# Patient Record
Sex: Female | Born: 1950 | Race: White | Hispanic: No | Smoking: Former smoker
Health system: Southern US, Community
[De-identification: ages and names within clinical notes are randomized; demographics above are authoritative.]

## PROBLEM LIST (undated history)

## (undated) DIAGNOSIS — R7303 Prediabetes: Secondary | ICD-10-CM

## (undated) DIAGNOSIS — I10 Essential (primary) hypertension: Secondary | ICD-10-CM

## (undated) DIAGNOSIS — F32A Depression, unspecified: Secondary | ICD-10-CM

## (undated) DIAGNOSIS — E079 Disorder of thyroid, unspecified: Secondary | ICD-10-CM

## (undated) DIAGNOSIS — R430 Anosmia: Secondary | ICD-10-CM

## (undated) DIAGNOSIS — I2510 Atherosclerotic heart disease of native coronary artery without angina pectoris: Secondary | ICD-10-CM

## (undated) DIAGNOSIS — K219 Gastro-esophageal reflux disease without esophagitis: Secondary | ICD-10-CM

## (undated) DIAGNOSIS — F329 Major depressive disorder, single episode, unspecified: Secondary | ICD-10-CM

## (undated) DIAGNOSIS — E785 Hyperlipidemia, unspecified: Secondary | ICD-10-CM

## (undated) DIAGNOSIS — R413 Other amnesia: Secondary | ICD-10-CM

## (undated) DIAGNOSIS — G2581 Restless legs syndrome: Secondary | ICD-10-CM

## (undated) DIAGNOSIS — E538 Deficiency of other specified B group vitamins: Secondary | ICD-10-CM

## (undated) DIAGNOSIS — G501 Atypical facial pain: Principal | ICD-10-CM

## (undated) HISTORY — DX: Essential (primary) hypertension: I10

## (undated) HISTORY — DX: Deficiency of other specified B group vitamins: E53.8

## (undated) HISTORY — PX: TONSILLECTOMY: SUR1361

## (undated) HISTORY — DX: Hyperlipidemia, unspecified: E78.5

## (undated) HISTORY — PX: JOINT REPLACEMENT: SHX530

## (undated) HISTORY — DX: Atypical facial pain: G50.1

## (undated) HISTORY — DX: Restless legs syndrome: G25.81

## (undated) HISTORY — DX: Prediabetes: R73.03

## (undated) HISTORY — DX: Major depressive disorder, single episode, unspecified: F32.9

## (undated) HISTORY — PX: APPENDECTOMY: SHX54

## (undated) HISTORY — DX: Depression, unspecified: F32.A

## (undated) HISTORY — DX: Gastro-esophageal reflux disease without esophagitis: K21.9

## (undated) HISTORY — DX: Disorder of thyroid, unspecified: E07.9

## (undated) HISTORY — PX: LASIK: SHX215

## (undated) HISTORY — DX: Other amnesia: R41.3

## (undated) HISTORY — PX: THYROIDECTOMY: SHX17

## (undated) HISTORY — PX: CATARACT EXTRACTION: SUR2

## (undated) HISTORY — PX: ABDOMINAL HYSTERECTOMY: SHX81

## (undated) HISTORY — PX: CHOLECYSTECTOMY: SHX55

## (undated) HISTORY — DX: Anosmia: R43.0

---

## 1998-02-19 ENCOUNTER — Other Ambulatory Visit: Admission: RE | Admit: 1998-02-19 | Discharge: 1998-02-19 | Payer: Self-pay | Admitting: *Deleted

## 1999-01-08 ENCOUNTER — Ambulatory Visit (HOSPITAL_COMMUNITY): Admission: RE | Admit: 1999-01-08 | Discharge: 1999-01-08 | Payer: Self-pay | Admitting: *Deleted

## 1999-03-06 ENCOUNTER — Other Ambulatory Visit: Admission: RE | Admit: 1999-03-06 | Discharge: 1999-03-06 | Payer: Self-pay | Admitting: *Deleted

## 1999-10-18 ENCOUNTER — Other Ambulatory Visit: Admission: RE | Admit: 1999-10-18 | Discharge: 1999-10-18 | Payer: Self-pay | Admitting: Family Medicine

## 2000-08-21 ENCOUNTER — Other Ambulatory Visit: Admission: RE | Admit: 2000-08-21 | Discharge: 2000-08-21 | Payer: Self-pay | Admitting: *Deleted

## 2000-10-16 ENCOUNTER — Ambulatory Visit (HOSPITAL_COMMUNITY): Admission: RE | Admit: 2000-10-16 | Discharge: 2000-10-16 | Payer: Self-pay | Admitting: *Deleted

## 2001-03-01 ENCOUNTER — Encounter: Admission: RE | Admit: 2001-03-01 | Discharge: 2001-03-01 | Payer: Self-pay | Admitting: Orthopedic Surgery

## 2001-03-01 ENCOUNTER — Encounter: Payer: Self-pay | Admitting: Orthopedic Surgery

## 2001-03-15 ENCOUNTER — Encounter: Admission: RE | Admit: 2001-03-15 | Discharge: 2001-03-15 | Payer: Self-pay | Admitting: Orthopedic Surgery

## 2001-03-15 ENCOUNTER — Encounter: Payer: Self-pay | Admitting: Orthopedic Surgery

## 2001-03-29 ENCOUNTER — Encounter: Admission: RE | Admit: 2001-03-29 | Discharge: 2001-03-29 | Payer: Self-pay | Admitting: Orthopedic Surgery

## 2001-03-29 ENCOUNTER — Encounter: Payer: Self-pay | Admitting: Orthopedic Surgery

## 2001-05-03 ENCOUNTER — Encounter (INDEPENDENT_AMBULATORY_CARE_PROVIDER_SITE_OTHER): Payer: Self-pay | Admitting: *Deleted

## 2001-05-03 ENCOUNTER — Inpatient Hospital Stay (HOSPITAL_COMMUNITY): Admission: EM | Admit: 2001-05-03 | Discharge: 2001-05-09 | Payer: Self-pay | Admitting: Emergency Medicine

## 2001-05-03 ENCOUNTER — Encounter: Payer: Self-pay | Admitting: Emergency Medicine

## 2001-05-04 ENCOUNTER — Encounter: Payer: Self-pay | Admitting: Surgery

## 2001-05-05 ENCOUNTER — Encounter: Payer: Self-pay | Admitting: Gastroenterology

## 2001-05-07 ENCOUNTER — Encounter: Payer: Self-pay | Admitting: Surgery

## 2001-10-06 ENCOUNTER — Encounter: Payer: Self-pay | Admitting: Family Medicine

## 2001-10-06 ENCOUNTER — Encounter: Admission: RE | Admit: 2001-10-06 | Discharge: 2001-10-06 | Payer: Self-pay | Admitting: Family Medicine

## 2001-10-13 ENCOUNTER — Encounter: Admission: RE | Admit: 2001-10-13 | Discharge: 2001-10-13 | Payer: Self-pay | Admitting: Family Medicine

## 2001-10-13 ENCOUNTER — Encounter (INDEPENDENT_AMBULATORY_CARE_PROVIDER_SITE_OTHER): Payer: Self-pay | Admitting: *Deleted

## 2001-10-13 ENCOUNTER — Encounter: Payer: Self-pay | Admitting: Family Medicine

## 2002-04-12 ENCOUNTER — Encounter: Payer: Self-pay | Admitting: Family Medicine

## 2002-04-12 ENCOUNTER — Encounter: Admission: RE | Admit: 2002-04-12 | Discharge: 2002-04-12 | Payer: Self-pay | Admitting: Family Medicine

## 2002-10-27 ENCOUNTER — Encounter: Payer: Self-pay | Admitting: Family Medicine

## 2002-10-27 ENCOUNTER — Encounter: Admission: RE | Admit: 2002-10-27 | Discharge: 2002-10-27 | Payer: Self-pay | Admitting: Family Medicine

## 2002-11-22 ENCOUNTER — Encounter: Payer: Self-pay | Admitting: Family Medicine

## 2002-11-22 ENCOUNTER — Encounter: Admission: RE | Admit: 2002-11-22 | Discharge: 2002-11-22 | Payer: Self-pay | Admitting: Family Medicine

## 2002-11-22 ENCOUNTER — Encounter (INDEPENDENT_AMBULATORY_CARE_PROVIDER_SITE_OTHER): Payer: Self-pay | Admitting: Specialist

## 2003-07-09 ENCOUNTER — Emergency Department (HOSPITAL_COMMUNITY): Admission: EM | Admit: 2003-07-09 | Discharge: 2003-07-09 | Payer: Self-pay | Admitting: Emergency Medicine

## 2003-10-07 ENCOUNTER — Other Ambulatory Visit: Admission: RE | Admit: 2003-10-07 | Discharge: 2003-10-07 | Payer: Self-pay | Admitting: Family Medicine

## 2003-10-10 ENCOUNTER — Other Ambulatory Visit: Admission: RE | Admit: 2003-10-10 | Discharge: 2003-10-10 | Payer: Self-pay | Admitting: Family Medicine

## 2003-10-13 ENCOUNTER — Encounter: Admission: RE | Admit: 2003-10-13 | Discharge: 2003-10-13 | Payer: Self-pay | Admitting: Family Medicine

## 2003-11-02 ENCOUNTER — Inpatient Hospital Stay (HOSPITAL_COMMUNITY): Admission: RE | Admit: 2003-11-02 | Discharge: 2003-11-06 | Payer: Self-pay | Admitting: Orthopedic Surgery

## 2003-11-06 ENCOUNTER — Inpatient Hospital Stay (HOSPITAL_COMMUNITY)
Admission: RE | Admit: 2003-11-06 | Discharge: 2003-11-13 | Payer: Self-pay | Admitting: Physical Medicine & Rehabilitation

## 2004-03-27 ENCOUNTER — Inpatient Hospital Stay (HOSPITAL_COMMUNITY): Admission: RE | Admit: 2004-03-27 | Discharge: 2004-03-30 | Payer: Self-pay | Admitting: Orthopedic Surgery

## 2004-03-27 ENCOUNTER — Ambulatory Visit: Payer: Self-pay | Admitting: Physical Medicine & Rehabilitation

## 2004-03-30 ENCOUNTER — Ambulatory Visit: Payer: Self-pay | Admitting: Physical Medicine & Rehabilitation

## 2004-03-30 ENCOUNTER — Inpatient Hospital Stay
Admission: RE | Admit: 2004-03-30 | Discharge: 2004-04-04 | Payer: Self-pay | Admitting: Physical Medicine & Rehabilitation

## 2004-10-19 IMAGING — CR DG CHEST 2V
2 series · 2 of 2 positions shown · non-contrast
Comparison: none

CLINICAL DATA: Right knee osteoarthritis.  Preop for joint replacement surgery.
 CHEST (TWO VIEWS)
 The heart size and mediastinal contours are normal. The lungs are clear. The visualized skeleton is unremarkable.

 IMPRESSION
 No active disease.

[view not recorded (1 of 2)]
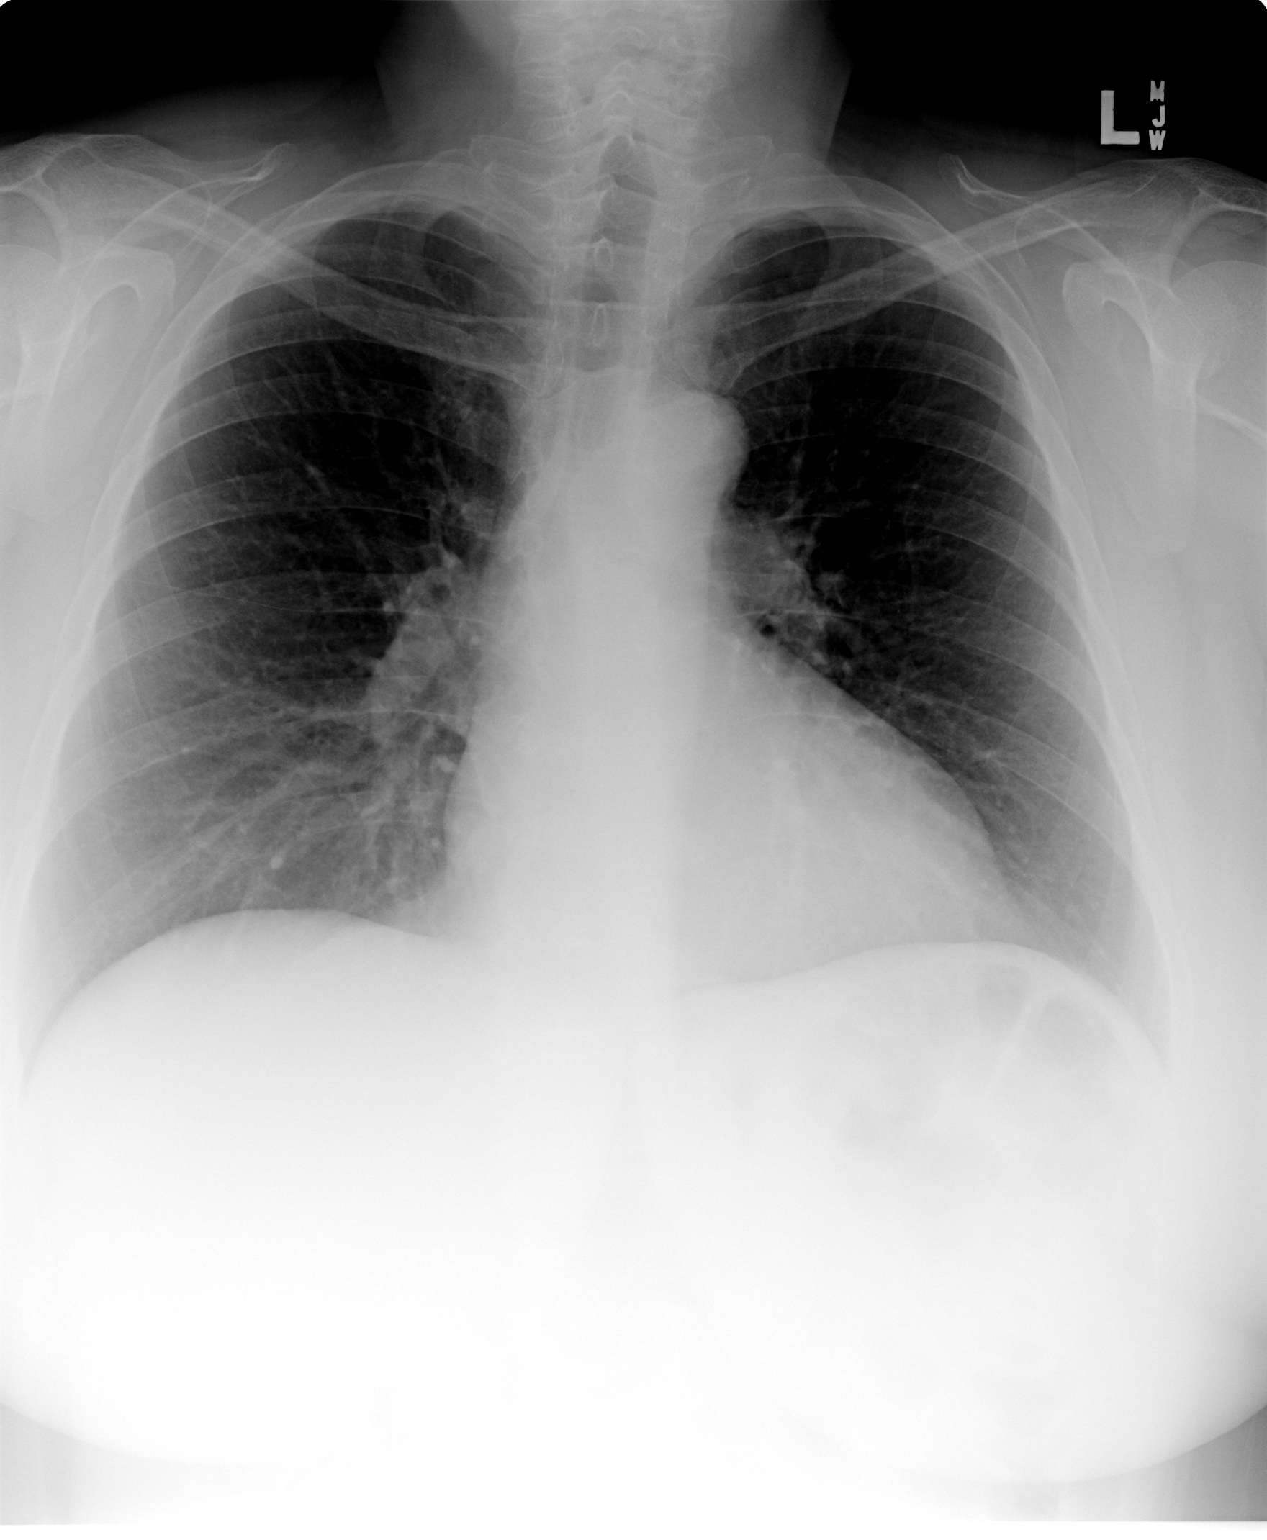

[view not recorded (2 of 2)]
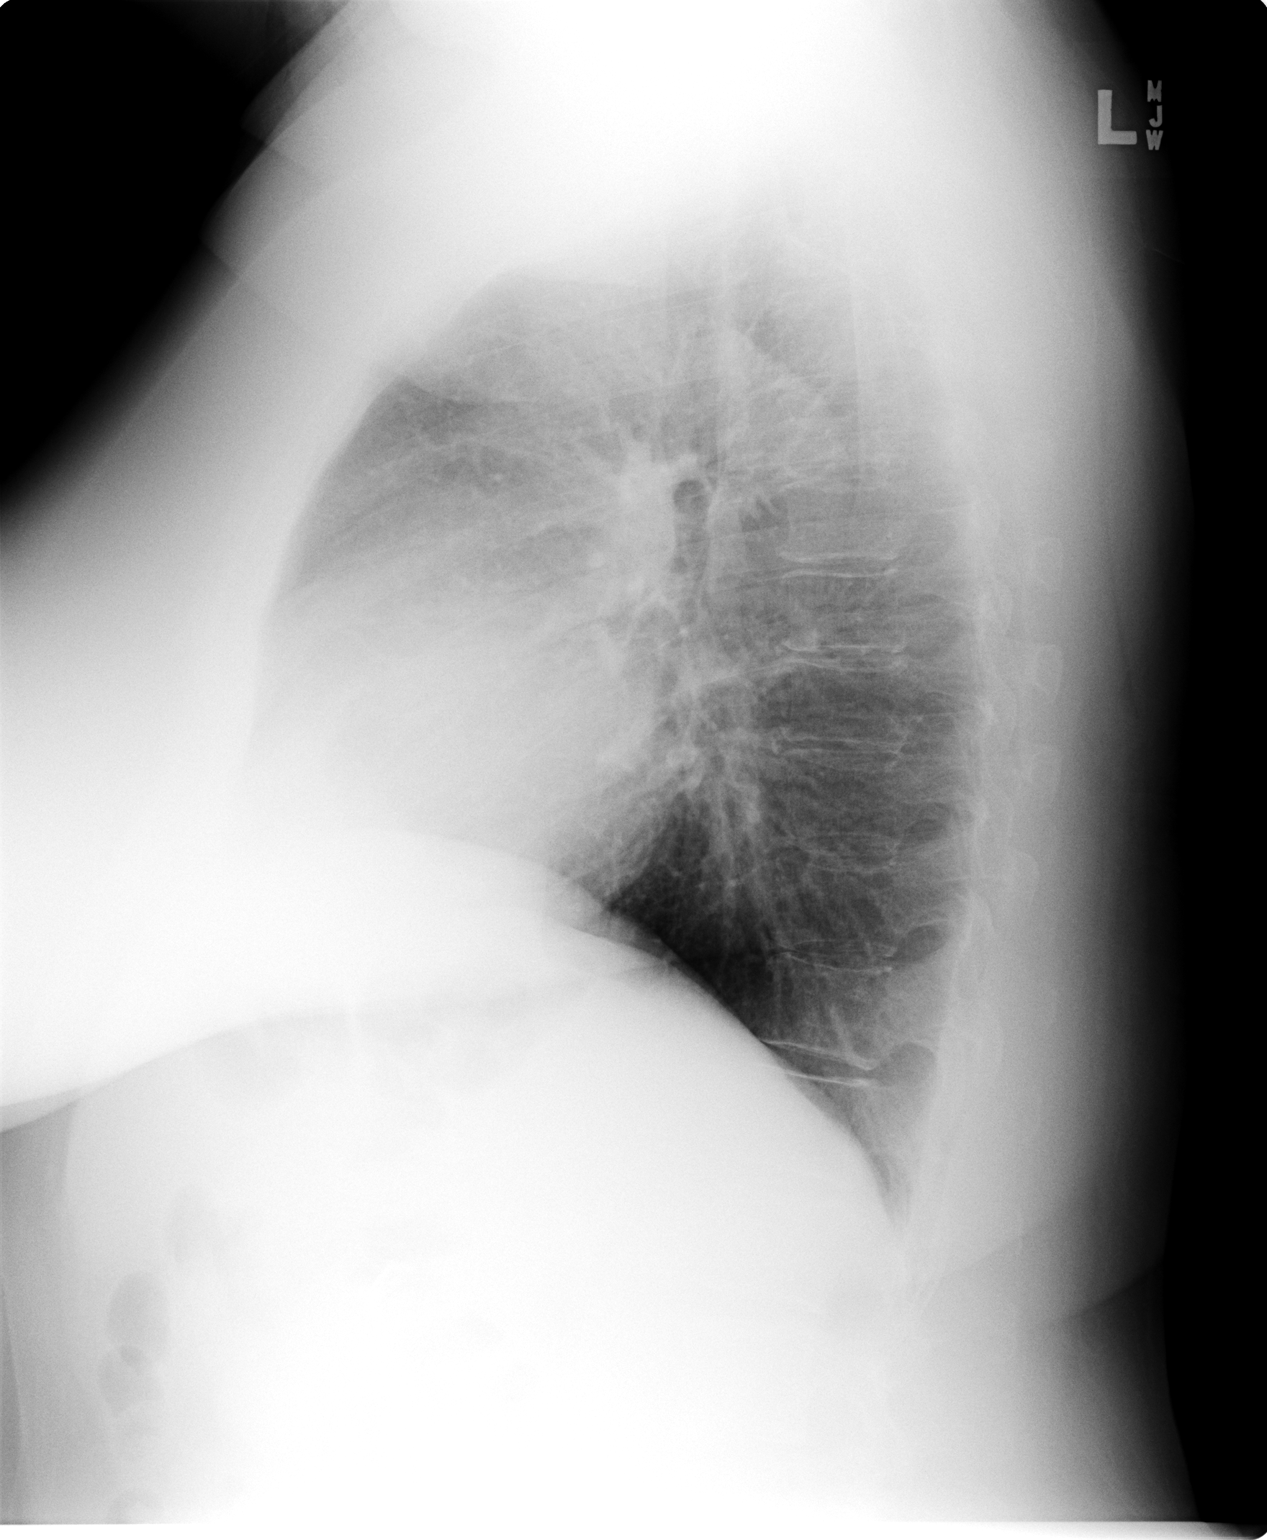

[2 of 2 positions shown; findings below may reference images not displayed]

## 2005-03-14 IMAGING — CR DG KNEE 1-2V PORT*L*
2 series · 2 of 2 positions shown · non-contrast
Comparison: None.

CLINICAL DATA: Status post total knee replacement.
 TWO VIEW LEFT KNEE PORTABLE

[view not recorded (1 of 2)]
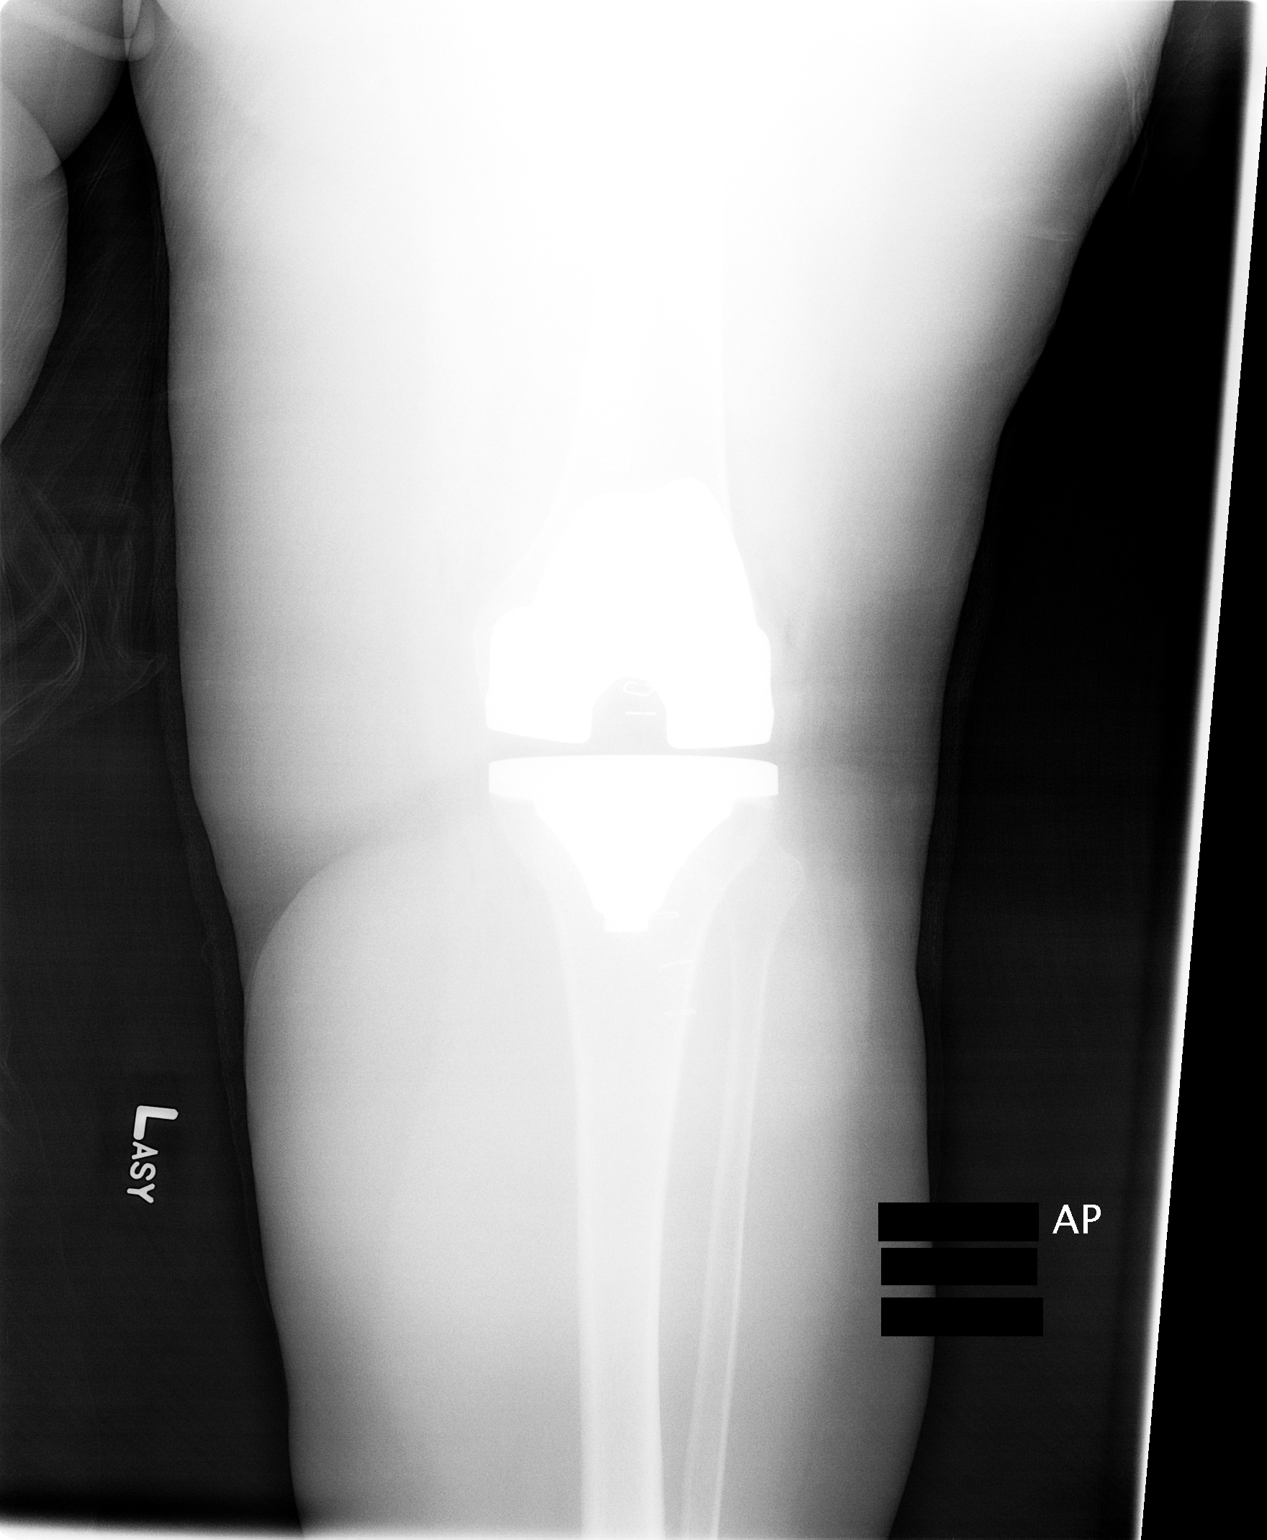

[view not recorded (2 of 2)]
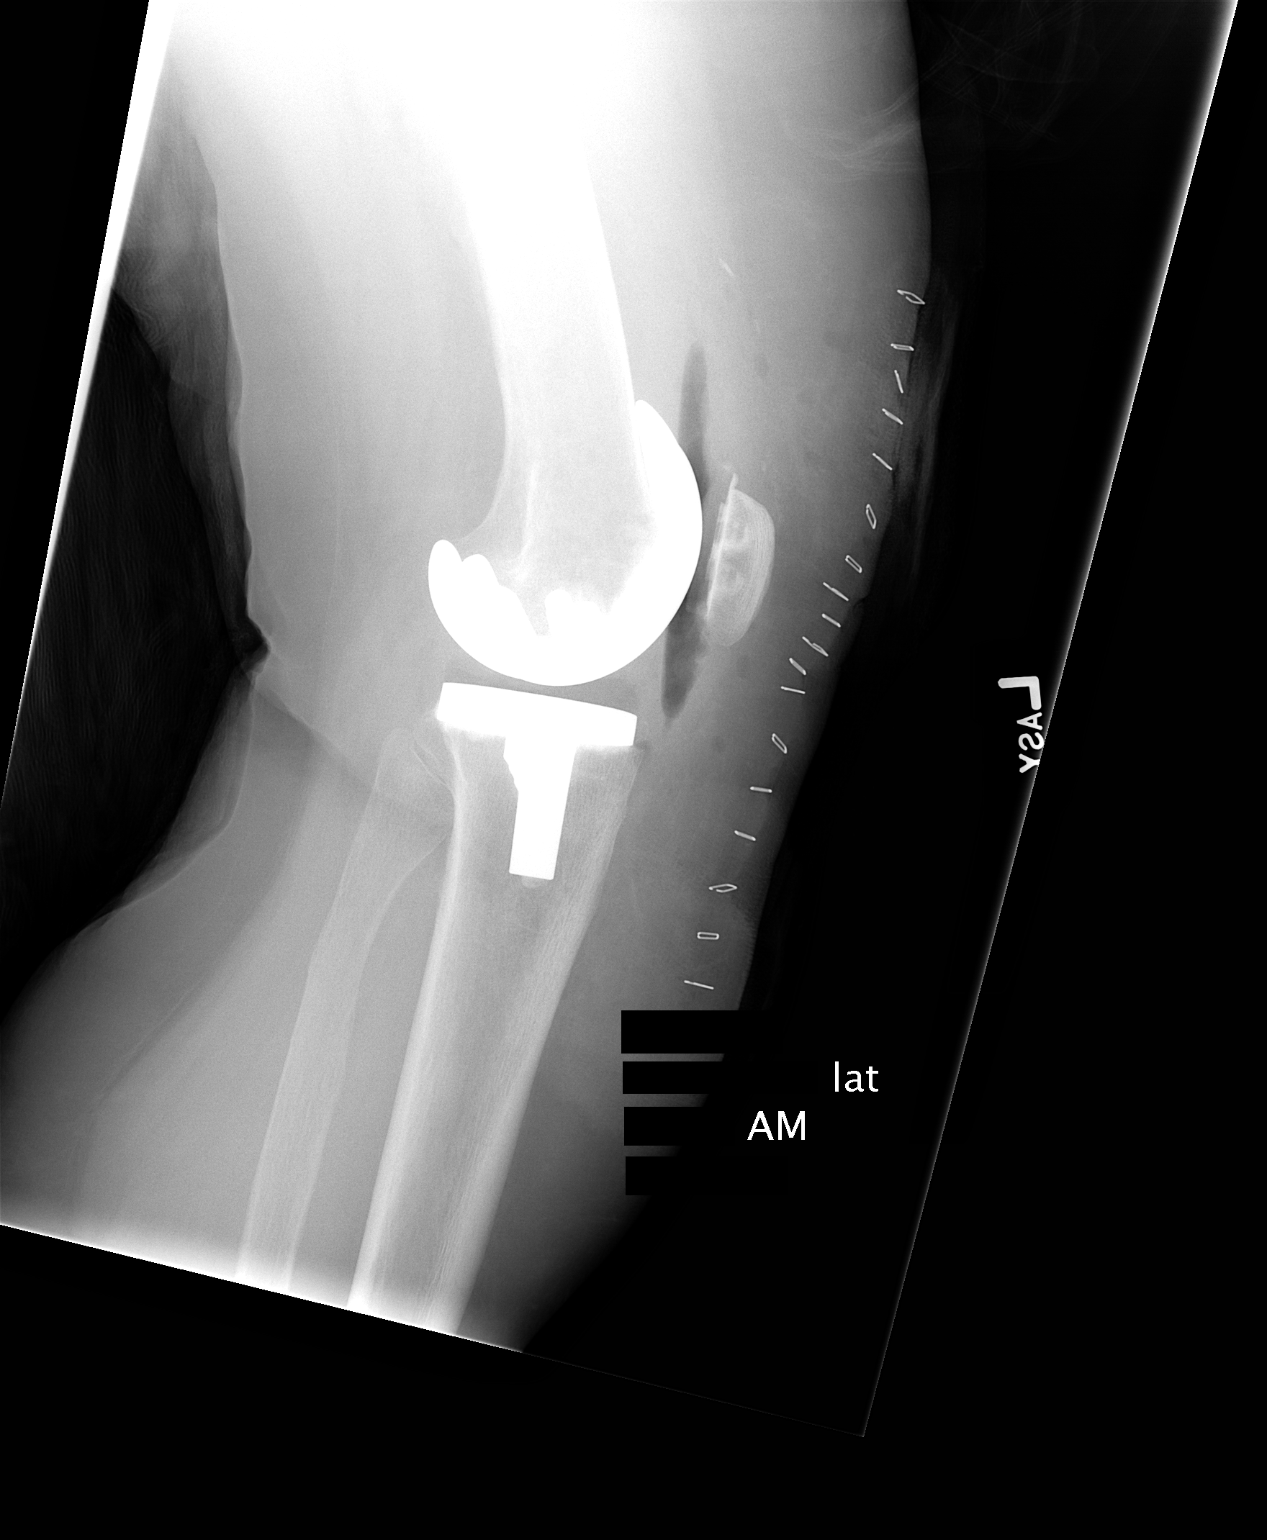

[2 of 2 positions shown; findings below may reference images not displayed]

Two views of the left knee show the patient to be immediately status post tricompartmental knee replacement.  Gas in the joint is compatible with the postoperative state.  No evidence for immediate hardware complications.  Skin staples are seen over the anterior soft tissues.
 IMPRESSION
 Status post tricompartmental knee replacement without evidence for complicating features.

## 2007-01-05 ENCOUNTER — Encounter: Admission: RE | Admit: 2007-01-05 | Discharge: 2007-01-05 | Payer: Self-pay | Admitting: Family Medicine

## 2007-11-26 ENCOUNTER — Encounter: Admission: RE | Admit: 2007-11-26 | Discharge: 2007-11-26 | Payer: Self-pay | Admitting: Family Medicine

## 2009-04-13 ENCOUNTER — Ambulatory Visit: Payer: Self-pay | Admitting: Diagnostic Radiology

## 2009-04-13 ENCOUNTER — Emergency Department (HOSPITAL_BASED_OUTPATIENT_CLINIC_OR_DEPARTMENT_OTHER): Admission: EM | Admit: 2009-04-13 | Discharge: 2009-04-13 | Payer: Self-pay | Admitting: Emergency Medicine

## 2010-01-19 ENCOUNTER — Emergency Department (HOSPITAL_BASED_OUTPATIENT_CLINIC_OR_DEPARTMENT_OTHER): Admission: EM | Admit: 2010-01-19 | Discharge: 2010-01-19 | Payer: Self-pay | Admitting: Emergency Medicine

## 2010-01-28 ENCOUNTER — Encounter: Admission: RE | Admit: 2010-01-28 | Discharge: 2010-01-28 | Payer: Self-pay | Admitting: Orthopedic Surgery

## 2010-04-12 ENCOUNTER — Encounter: Admission: RE | Admit: 2010-04-12 | Discharge: 2010-04-12 | Payer: Self-pay | Admitting: Family Medicine

## 2010-06-07 ENCOUNTER — Ambulatory Visit (HOSPITAL_COMMUNITY)
Admission: RE | Admit: 2010-06-07 | Discharge: 2010-06-07 | Payer: Self-pay | Source: Home / Self Care | Attending: Interventional Cardiology | Admitting: Interventional Cardiology

## 2010-11-08 NOTE — Discharge Summary (Signed)
NAMEJOHNIE, STADEL            ACCOUNT NO.:  [id number]   MEDICAL RECORD NO.:  [id number]          PATIENT TYPE:  INP   LOCATION:  5009                         FACILITY:  [facility]   PHYSICIAN:  Nadara Mustard, MD     DATE OF BIRTH:  1950/08/26   DATE OF ADMISSION:  03/27/2004  DATE OF DISCHARGE:  03/30/2004                                 DISCHARGE SUMMARY   DIAGNOSIS:  Osteoarthritis left knee.   PROCEDURE:  Left total knee arthroplasty.   DISPOSITION:  Discharged to subacute care in stable condition on March 30, 2004.  Follow-up in the office two weeks after discharge from the hospital.   HISTORY OF PRESENT ILLNESS:  The patient is a 60 year old woman with  osteoarthritis of her left knee.  She has failed conservative care and is  unable to perform activities of daily living due to persistent left knee  pain and presents at this time for left total knee arthroplasty.   HOSPITAL COURSE:  The patient's hospital course was essentially  unremarkable.  She underwent a left total knee arthroplasty on March 27, 2004, with Osteonics components with a #9 femur, #7 tibia, #7 patella and a  10 mm poly tray of the Scorpio components.  She received Kefzol for  infection prophylaxis and postoperatively was started on Coumadin for DVT  prophylaxis with four weeks of Coumadin scheduled.  The patient had a slow  improvement with range of motion and she was started on a CPM machine to  improve her range of motion.  The patient progressed slowly with therapy.  Her hemoglobin on March 29, 2004, was 9.6.  Her potassium dropped to 2.7  and she was given 40 mEq of K-Dur.  Her Foley catheter and IV were  discontinued on March 29, 2004.  The patient remained unsafe for discharge  to home and she was discharged to [facility] in stable condition on March 30, 2004, with follow-up in the office two weeks after discharge from the  hospital.      Vernia Buff   MVD/MEDQ  D:  04/23/2004  T:  04/23/2004  Job:   [id number]

## 2010-11-08 NOTE — Op Note (Signed)
[facility]  Patient:    [name], [name] Visit Number: [id number] MRN: [id number]          Service Type: MED Location: [phone] [id number] Attending Physician:  Bonnetta Barry Dictated by:   Velora Heckler, M.D. Proc. Date: 05/07/01 Admit Date:  05/03/2001   CC:         John C. Madilyn Fireman, M.D.  Talmadge Coventry, M.D.  Carman Ching, M.D.   Operative Report  PREOPERATIVE DIAGNOSIS:  Acute cholecystitis, cholelithiasis.  POSTOPERATIVE DIAGNOSIS:  Acute cholecystitis, cholelithiasis, choledocholithiasis.  PROCEDURE:  Laparoscopic cholecystectomy with intraoperative cholangiography.  SURGEON:  Velora Heckler, M.D.  ASSISTANT:  Ollen Gross. Vernell Morgans, M.D.  ANESTHESIA:  General per Dr. Rica Mast.  ESTIMATED BLOOD LOSS:  Minimal.  PREPARATION:  Betadine.  COMPLICATIONS:  None.  INDICATIONS:  The patient is a 60 year old white female who was admitted to my service from the emergency department on May 03, 2001, with acute cholecystitis and cholelithiasis.  The patient was noted to have significantly elevated liver function tests.  These continued to rise over the initial 24 hours of hospitalization.  Gastroenterology was consulted and she was seen by Dr. Dorena Cookey.  She was taken for endoscopic retrograde cholangiopancreatography with sphincterotomy on May 05, 2001. Sphincterotomy was performed successfully.  No stones were identified in the common bile duct.  The patients liver enzymes normalized and she continued to be treated with intravenous antibiotics.  She was prepared and brought to the operating room today for cholecystectomy.  DESCRIPTION OF PROCEDURE:  The procedure was done in OR #1 at the [facility].  The patient was brought to the operating room and placed in a supine position on the operating room table.  Following administration of general anesthesia, the patient was prepped and draped in the usual  strict aseptic fashion.  After ascertaining that an adequate level of anesthesia was then obtained, an infraumbilical incision was made with a #15 blade. Dissection was carried down through the subcutaneous tissues.  Fascia was incised in the midline and the peritoneal cavity was entered cautiously.  An 0 Vicryl pursestring suture was placed in the fascia.  Hasson cannula was introduced under direct vision and secured with a pursestring suture.  Abdomen was insufflated with CO2.  Laparoscopic was introduced under direct vision and the abdomen explored.  There are omental adhesions to the anterior abdominal wall in the right mid abdomen.  By advancing the laparoscope cephalad and into the patients, we were able to go around these adhesions and visualize the right upper quadrant adequately.  Operative ports were placed along the right costal margin in the midline, midclavicular line in the anterior axillary line.  Fundus of the gallbladder was grasped and retracted cephalad. Gallbladder appears to be moderately edematous.  There is no gross infections. The liver appears grossly normal.  Peritoneum is incised at the neck of the gallbladder.  Triangle of Calot is opened widely.  Cystic duct is dissected out along its length as is the cystic artery.  Clip is placed at the neck of the gallbladder and the cystic duct is incised with the endoshears.  A Cook cholangiography catheter was introduced through a stab wound in the right upper quadrant of the abdominal wall.  It was irrigated with saline and then inserted into the cystic duct and secured with a Ligalip.  Using C-arm fluoroscopy, real time cholangiography is performed.  There is rapid filling of a moderately long cystic duct.  There is  free flow of contrast into the common bile duct .  There appears to be a filling defect just proximal in the common hepatic duct which then floats up to the level of the bifurcation. There appears to be  multiple stones and debris in the distal common bile duct.  With increased pressure on the contrast some of the contrast is flushed through the distal debris and into the duodenum.  Cook cholangiography catheter is withdrawn and the cystic duct is doubly clipped and divided. Cystic artery was dissected up along its length, doubly clipped and divided. Some venous tributaries to the posterior wall of the gallbladder are dissected out, doubly clipped and divided.  Gallbladder was then excised from the gallbladder bed using the hook electrocautery for hemostasis.  Good hemostasis was noted.   Gallbladder bed is irrigated copiously with warm saline which is evacuated.  Gallbladder is placed into an endocatch bag and withdrawn through the umbilical port without difficulty.  The right upper quadrant is irrigated with warm saline which is evacuated.  Good hemostasis is noted.  All port sites show good hemostasis upon removal of the ports.  Pneumoperitoneum is released.  Port sites are anesthetized with local anesthetic.  The wounds are closed with interrupted 4-0 Vicryl subcuticular sutures.  The 0 Vicryl pursestring suture at the umbilicus had been tied securely.  Wounds were washed and dried and Benzoin and Steri-Strips were applied.  Sterile gauze dressings are applied.  The patient is awakened from anesthesia and brought to the recovery room in stable condition.  The patient tolerated the procedure well. Dictated by:   Velora Heckler, M.D. Attending Physician:  Bonnetta Barry DD:  05/07/01 TD:  05/07/01 Job: [id number]

## 2010-11-08 NOTE — Discharge Summary (Signed)
NAMEGAZELLE, [name]            ACCOUNT NO.:  [id number]   MEDICAL RECORD NO.:  [id number]          PATIENT TYPE:  ORB   LOCATION:  4502                         FACILITY:  [facility]   PHYSICIAN:  Mariam Dollar, P.A.  DATE OF BIRTH:  [birth year]   DATE OF ADMISSION:  03/30/2004  DATE OF DISCHARGE:  04/04/2004                                 DISCHARGE SUMMARY   DISCHARGE DIAGNOSES:  1.  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  2.  Pain management.  3.  Coumadin for deep vein thrombosis prophylaxis.  4.  Hypokalemia, resolved.  5.  Hypertension.  6.  Anemia.  7.  Hypothyroidism.  8.  Right total knee replacement May 2005.   HISTORY OF PRESENT ILLNESS:  This is a 60 year old white female with chronic  left knee pain secondary to end-stage degenerative changes and no relief of  conservative care.  Underwent a left total knee replacement March 27, 2004,  per Dr. Lajoyce Corners.  Placed on Coumadin for deep vein thrombosis, prophylaxis.  Weightbearing as tolerated.  Postoperative course unremarkable.  Foley  catheter tube removed.  No chest pain or shortness of breath.  Mild  hypokalemia, 2.7 and supplemented.   PAST MEDICAL HISTORY:  See discharge diagnoses.   ALLERGIES:  [name].   MEDICATIONS PRIOR TO ADMISSION:  1.  Synthroid.  2.  Hydrochlorothiazide.  3.  Premarin.  4.  Prilosec.  5.  Vicodin as needed.   SOCIAL HISTORY:  Lives with husband in [place], one level home, two steps  to entry.  Husband disabled but works Friday and Saturday.  Sister can  assist on discharge.   HOSPITAL COURSE:  Patient with progressives gaines while on rehabilitation  services with therapies initiated daily.  The following issues are followed  in patient's rehabilitation course.  Pertaining to Ms. Pebley's left  total knee replacement, surgical site healing nicely.  No signs of  infections.  Staples remained in place.  She would follow up with  Orthopedic  Services.  CPM machine at 45 degrees, would be considered for home CPM  machine.  She remained on Coumadin for deep vein thrombosis prophylaxis.  Latest INR of 1.8.  She remained on subcutaneous Lovenox until INR greater  than 2.  Her hypokalemia had resolved at 3.5.  She remained on potassium  supplement.  Blood pressures controlled.  Postoperative anemia 9.2.  Hematocrit 28.  She remained on iron supplement.  It was advised she  remained on her hormone supplement for hypothyroidism.  Overall, for her  functional mobility, she was supervision bed mobility, transfers, ambulating  supervision with a rolling walker, supervision for stairs.  Home Health  therapies had been arranged.   DISCHARGE MEDICATIONS:  1.  Coumadin with latest dose of 10 mg adjusted accordingly to be completed      on April 27, 2004.  2.  Premarin 0.625 mg daily.  3.  Protonix 40 mg daily.  4.  Synthroid 75 mcg daily.  5.  Hydrochlorothiazide 25 mg daily.  6.  Multivitamin daily.  7.  Oxycodone as needed  for breakthrough pain.   DISCHARGE INSTRUCTIONS:  1.  Activity:  As tolerated with knee precautions.  2.  Diet:  Regular.  3.  Wound Care:  Follow with Dr.  Lajoyce Corners for removal of staples.  Call if any      increased redness, drainage or fever.   SPECIAL INSTRUCTIONS:  Home Health nurse to complete Coumadin protocol.  Home Health physical and occupational therapy arranged.       DA/MEDQ  D:  04/03/2004  T:  04/03/2004  Job:  [id number]   cc:   Nadara Mustard, M.D.  Fax: [phone]   Talmadge Coventry, M.D.  [address]  [place]  [place] [address]  Fax: [phone]

## 2010-11-08 NOTE — Discharge Summary (Signed)
[name], [name]                      ACCOUNT NO.:  [id number]   MEDICAL RECORD NO.:  [id number]                   PATIENT TYPE:  IPS   LOCATION:  4143                                 FACILITY:  [facility]   PHYSICIAN:  Mariam Dollar, P.A.               DATE OF BIRTH:  Jul 09, 1950   DATE OF ADMISSION:  11/06/2003  DATE OF DISCHARGE:  11/13/2003                                 DISCHARGE SUMMARY   DISCHARGE DIAGNOSES:  1. Right total knee arthroplasty on Nov 02, 2003 secondary to degenerative     joint disease.  2. Anemia.  3. Pain management.  4. Coumadin for deep vein thrombophlebitis prophylaxis.  5. Hypertension.  6. Hypothyroidism.  7. Hypokalemia.   HISTORY OF PRESENT ILLNESS:  This is a 60 year old white female admitted on  Nov 02, 2003 with end-stage changes of the right knee and no relief with  conservative care.  She underwent right total knee arthroplasty on Nov 02, 2003 per Nadara Mustard, M.D.  She was placed on Coumadin for deep vein  thrombophlebitis prophylaxis, weight bearing as tolerated.  Postoperative  hypokalemia with supplement added.  Anemia 8.1 and monitored.  Minimal  assist for bed mobility and transfers.  She was admitted for comprehensive  rehabilitation program.   PAST MEDICAL HISTORY:  See discharge diagnoses.  Primary care Lauralei Clouse is  Talmadge Coventry, M.D.   PAST SURGICAL HISTORY:  Hysterectomy, appendectomy, cholecystectomy.   ALLERGIES:  Adhesive tape.   HABITS:  Denies alcohol or tobacco.   MEDICATIONS PRIOR TO ADMISSION:  1. Vicodin.  2. Klonopin.   SOCIAL HISTORY:  Lives with husband, independent prior to admission.  One  level home, three steps to entry.   HOSPITAL COURSE:  The patient had progressive gains while on rehabilitation  services with therapies initiated on a b.i.d. basis.  The following issues  are followed during the patient's treated course.   Her ------------- right total knee arthroplasty surgical site  healing  nicely.  Staples have been removed.  CPM machine 85 degrees.  Weight bearing  is tolerated.  She is independent in her room.  Home therapies will be  arranged.  Postoperative anemia stable.  Remained on iron supplement.  Latest hemoglobin 9.1, hematocrit 26.6.  She continued on Coumadin for deep  vein thrombosis prophylaxis.  Latest INR is 2.8.  She would complete  Coumadin protocol as advised.  Blood pressure controlled with  hydrochlorothiazide.  She would remain on her hormone supplement for  hypothyroidism.  She had no bowel or bladder disturbances.  Early on  monitoring of hypokalemia which remained stable at 3.5.  Overall for her  functional mobility she was ambulating extended distances with a walker,  independent in her room.  Supervision for bed mobility and transfers.   DISCHARGE MEDICATIONS:  1. Coumadin at the time of discharge 7.5 mg adjusted accordingly with home     health therapies to manage.  2.  Synthroid 75 mcg daily.  3. Hydrochlorothiazide 25 mg daily.  4. Trinsicon twice daily.  5. Premarin 0.45 mg daily.  6. Tylox one or two tablets every four hours as needed for pain.  7. Klonopin 0.5 mg at bedtime for restless leg syndrome.   ACTIVITY:  As tolerated.   DIET:  Regular.   WOUND CARE:  Cleanse incision daily warm soap and water.   SPECIAL INSTRUCTIONS:  Home health physical and occupational therapy.  Home  health nurse to complete Coumadin protocol with Coumadin to be completed on  December 03, 2003.  She should follow Nadara Mustard, M.D. orthopedic services,  call for appointment.                                                Mariam Dollar, P.A.    DA/MEDQ  D:  11/13/2003  T:  11/14/2003  Job:  [id number]   cc:   Nadara Mustard, M.D.  Fax: [phone]   Talmadge Coventry, M.D.  [address]  [place]  [place] [address]  Fax: [phone]

## 2010-11-08 NOTE — H&P (Signed)
[name], [name]                      ACCOUNT NO.:  [id number]   MEDICAL RECORD NO.:  [id number]                   PATIENT TYPE:  INP   LOCATION:  2899                                 FACILITY:  [facility]   PHYSICIAN:  Nadara Mustard, M.D.                DATE OF BIRTH:  14-Jun-1951   DATE OF ADMISSION:  11/02/2003  DATE OF DISCHARGE:                                HISTORY & PHYSICAL   HISTORY OF PRESENT ILLNESS:  The patient is a 60 year old woman with  osteoarthritis in her right knee. The patient has failed conservative care  including arthroscopy, anti-inflammatories. The patient states she is unable  to perform activities of daily living due to pain and presents at this time  for a total knee arthroplasty on the right.   ALLERGIES:  No known drug allergies.   MEDICATIONS:  1. Synthroid 75 mg daily.  2. Hydrochlorothiazide 25 mg daily.  3. Premarin 0.45 mg daily.   PAST MEDICAL HISTORY:  1. Tonsillectomy.  2. Appendectomy.  3. Hysterectomy.  4. Right thyroid tumor removal.  5. Cholecystectomy.  6. Childbirth x1.   FAMILY HISTORY:  Positive for diabetes, heart disease, lung disease, lung  cancer and hypertension.   REVIEW OF SYSTEMS:  Positive for arthritis, thyroid problems and mild  hypertension.   PHYSICAL EXAMINATION:  VITAL SIGNS:  Temperature 97.0, pulse 84, respiratory  rate 16, blood pressure 122/84.  GENERAL:  She is in no acute distress.  NECK:  Supple, no bruits.  LUNGS:  Clear to auscultation.  CARDIOVASCULAR:  Regular rate and rhythm.  EXTREMITIES:  Examination of the right knee shows range of motion, 0-110  degrees. Her arthroscopic findings in November 2004 showed a  tricompartmental osteoarthritis.   ASSESSMENT:  Tricompartmental osteoarthritis, right knee.   PLAN:  The patient is scheduled for right total knee arthroplasty at this  time. The risks and benefits were discussed including infection,  neurovascular injury, persistent pain, need for  additional surgery, the risk  of DVT and pulmonary embolus. The patient states she understands and wishes  to proceed at this time.                                                Nadara Mustard, M.D.    MVD/MEDQ  D:  11/02/2003  T:  11/02/2003  Job:  [id number]

## 2010-11-08 NOTE — Discharge Summary (Signed)
[facility]  Patient:    [name], [name] Visit Number: [id number] MRN: [id number]          Service Type: MED Location: [phone] Attending Physician:  Bonnetta Barry Dictated by:   Velora Heckler, M.D. Admit Date:  05/03/2001 Discharge Date: 05/09/2001                             Discharge Summary  REASON FOR ADMISSION:  Acute cholecystitis.  BRIEF HISTORY:  The patient is a 60 year old white female who presents to the emergency department at [facility] with a 9-hour history of unrelenting upper abdominal pain.  The patient has had intermittent such episodes dating from July 2002.  She has been treated with proton pump inhibitors and H2 blockers; however, on this episode with persistent pain the patient developed nausea.  She presented to the emergency department when she was not able to be seen by her primary care physician.  She underwent evaluation.  Laboratory studies showed elevated liver function tests. Ultrasound of the abdomen demonstrated multiple gallstones with gallbladder wall thickening, consistent with acute cholecystitis.  The patient was referred to general surgery and admitted on to the general surgical service.  HOSPITAL COURSE:  The patient was admitted May 03, 2001.  She was started on intravenous antibiotics.  Due to her elevated liver function tests, she was seen in consultation by Dr. Dorena Cookey from gastroenterology.  The patient underwent ERCP, which demonstrated a normal duct.  No definite stones were identified.  Sphincterotomy was performed.  The patient was continued on IV antibiotics.  She was prepared for the operating room and taken to the operating room on May 07, 2001.  She underwent laparoscopic cholecystectomy with intraoperative cholangiography.  Intraoperative cholangiogram demonstrated common bile duct stones.  The patient was seen back in followup by gastroenterology.   Given the fact that sphincterotomy had already been performed, a decision was made to observe the patient.  She had gradual improvement in her liver function tests.  She was prepared for discharge home on the second postoperative day.  DISCHARGE PLANNING:  The patient is discharged home on May 09, 2001 in good condition, tolerating a regular diet, and ambulating independently.  She will be seen back in my office at [facility] in 7-10 days with repeat liver function tests at that time.  DISCHARGE MEDICATIONS:  Vicodin as needed for pain and other home medications as per usual.  FINAL DIAGNOSIS:  Acute cholecystitis, cholelithiasis, choledocholithiasis.  CONDITION ON DISCHARGE:  Improved. Dictated by:   Velora Heckler, M.D. Attending Physician:  Bonnetta Barry DD:  06/08/01 TD:  06/08/01 Job: [id number]

## 2010-11-08 NOTE — Op Note (Signed)
[name], [name]                      ACCOUNT NO.:  [id number]   MEDICAL RECORD NO.:  [id number]                   PATIENT TYPE:  INP   LOCATION:  5031                                 FACILITY:  [facility]   PHYSICIAN:  Nadara Mustard, M.D.                DATE OF BIRTH:  12-Dec-1950   DATE OF PROCEDURE:  11/02/2003  DATE OF DISCHARGE:                                 OPERATIVE REPORT   PREOPERATIVE DIAGNOSIS:  Osteoarthritis, right knee.   POSTOPERATIVE DIAGNOSIS:  Osteoarthritis, right knee.   OPERATION PERFORMED:  Right total knee arthroplasty with #9 femur, #7 tibia,  #7 patella and a 10 mm poly tray.   SURGEON:  Nadara Mustard, M.D.   ANESTHESIA:  LMA plus femoral block.   ANTIBIOTICS:  1g Kefzol.   TOURNIQUET TIME:  Seven minutes.   DISPOSITION:  To post anesthesia care unit in stable condition.   INDICATIONS FOR PROCEDURE:  The patient is a 60 year old woman with  tricompartmental osteoarthritis of her right knee.  The patient has  undergone conservative care including arthroscopic intervention.  Patient  has failed for conservative care and presents for total knee arthroplasty.  The risks and benefits were discussed including infection, neurovascular  injury, persistent pain, need for additional surgery.  The patient states  that she understands and wishes to proceed at this time.   DESCRIPTION OF PROCEDURE:  The patient was brought to operating room 5 after  undergoing a popliteal block.  The patient then underwent a general LMA  anesthetic.  After an adequate level of anesthesia obtained, the patient's  right lower extremity was prepped using DuraPrep and draped into a sterile  field.  An Collier Flowers was used to cover all exposed skin.  The leg was elevated  and the tourniquet was inflated about the thigh at 350 mmHg.  A midline  incision was made.  This was carried down through the adipose tissue and a  medial parapatellar retinacular incision was made.  The patient  had a  significant amount of bleeding which appeared to be exacerbated by the  tourniquet and the tourniquet was released and the remainder of the case was  performed without the tourniquet with a total tourniquet time of  approximately seven minutes.  Tourniquet failure most likely due to the size  of her thigh.  After medial parapatellar retinacular incision was made.  Attention was first focused on the femur.  A starting portal was first made  in the femur and the guidewire was then inserted for the intramedullary  alignment with five degrees of valgus for the right knee.  A 10 mm cutting  block was used with additional 2 mm and this was pinned and the distal cut  was made.  The femoral chamfer guide block was then placed and this was  sized for a 9.  Chamfer cuts were made and the dorsal cut was smooth with  the cortical  surface of the femur.  Attention was focused on the tibia.  Using external alignment guide for the tibia, the 4 mm stylus was used to  take 4 mm off the medial tibial plateau.  This cut was made with a 5 degree  posterior slope with alignment in both AP and lateral planes with the  external alignment guide.  The box cutter was then used make the chamfer  cuts for the posterior stabilized poly tray.  The box cut was made.  The  trial components were placed and the knee was placed through a range of  motion.  External alignment was checked with the external alignment guide  and a #7 tibial tray was marked with the proper rotation.  The tower was  then used to make the keel cuts for the tibial component.  The patella was  then resurfaced with 10 mm being removed from the patella.  The punch holes  were made for the patella component.  The wound was irrigated with the pulse  lavage.  Cement was mixed.  The tibial and femoral components and then  patellar components were cemented in place. The cement was removed. The knee  was again irrigated with normal saline.  After the  components had hardened,  the poly tray was inserted.  Again the knee was placed through a range of  motion with full extension and flexion of 130 degrees.  The wound was again  irrigated with normal saline.  The retinacular incision was closed using #1  Vicryl.  Deep fascia and superficial fascial layers were closed using 2-0  nylon.  The skin was closed using Proximate staples.  The wound was covered  with Adaptic orthopedic sponges, ABD dressing, Webril and a Coban dressing.  Patient was extubated and taken to PACU in stable condition.                                               Nadara Mustard, M.D.    MVD/MEDQ  D:  11/02/2003  T:  11/03/2003  Job:  [id number]

## 2010-11-08 NOTE — Procedures (Signed)
[facility]  Patient:    [name], [name] Visit Number: [id number] MRN: [id number]          Service Type: MED Location: [phone] Attending Physician:  Bonnetta Barry Dictated by:   Everardo All Madilyn Fireman, M.D. Proc. Date: 05/05/01 Admit Date:  05/03/2001   CC:         Velora Heckler, M.D.   Procedure Report  PROCEDURE:  Endoscopic retrograde cholangiopancreatography with sphincterotomy.  SURGEON:  John C. Madilyn Fireman, M.D.  INDICATIONS FOR PROCEDURE:  Cholecystitis with suspected common bile duct stones.  DESCRIPTION OF PROCEDURE:  The patient was placed in the prone position and placed on the pulse monitor with continuous low flow oxygen delivered by nasal cannula.  She was sedated with 80 mg of IV Demerol and 7 mg of IV Versed.  The Olympus video side viewing endoscope was advanced laterally to the oropharynx, esophagus, and stomach.  No abnormalities were seen within the esophagus or stomach.  The pylorus was traversed and the papilla of Vater located on the medial duodenal wall and had a normal appearance, although somewhat generous in size, and was cannulated fairly easily with the Wilson-Cook sphincterotome. No pancreatic cannulations or injection was performed.  The cholangiogram was obtained which showed a normal caliber common bile duct and questionable small distal filling defect.  The cystic duct and gallbladder also filled and showed multiple small stones.  A 1 cm sphincterotomy was performed and two balloon sweeps were made with the 11.5 cm balloon.  No stone fragments were seen to be delivered and on a second cholangiogram, there appeared to be multiple filling defects consistent with air bubbles which were cleared on the second sweep.  The scope was then withdrawn and the patient returned to the recovery room in stable condition.  She tolerated the procedure well and there were no immediate complications.  IMPRESSION:  No obvious  common bile duct stone, but with a questionable filling defect seen at the beginning of the procedure, status post sphincterotomy.  PLAN:  Proceed with laparoscopic cholecystectomy. Dictated by:   Everardo All Madilyn Fireman, M.D. Attending Physician:  Bonnetta Barry DD:  05/05/01 TD:  05/05/01 Job: [id number]

## 2010-11-08 NOTE — Cardiovascular Report (Signed)
[facility] [place]  Patient:    [name], [name]                  MRN: [id number] Proc. Date: 10/16/00 Adm. Date:  [id number] Attending:  Meade Maw A CC:         Talmadge Coventry, M.D.   Cardiac Catheterization  REFERRING PHYSICIAN:  Talmadge Coventry, M.D.  INDICATIONS FOR PROCEDURE:  The patient is a 60 year old female, with typical angina.  Coronary risk factors include a significant family history, brother with coronary artery disease, age 16, mother with a premature history of coronary artery disease and postmenopausal status.  The patient is morbidly obese and it is felt that nuclear Cardiolite would have less sensitivity.  DESCRIPTION OF PROCEDURE:  After obtaining written informed consent, the patient was brought to the cardiac catheterization lab in the postabsorptive state.  Preoperative sedation was achieved using IV Versed.  The right groin was prepped and draped in the usual sterile fashion.  Local anesthesia was achieved using 1% Xylocaine.  A 6 French hemostasis sheath was placed into the right femoral artery using a modified Seldinger technique. Selective coronary angiography was performed using a JL4, JR4 Judkins catheter.  All catheter exchange were made over a guide wire.  Following the procedure there was no identifiable coronary artery disease.  The patient was transferred to the holding area.  The hemostasis sheath was removed.  Hemostasis was achieved using digital pressure.  FINDINGS:  The AO pressure is 113/60, LV pressure is 115/16.  Fluoroscopy revealed no significant calcification.  CORONARY ANGIOGRAPHY:  Left main coronary artery:  The left main coronary artery bifurcated into the left anterior descending and circumflex vessel.  T There was no significant disease in the left main coronary artery.  Left anterior descending:  The left anterior descending gave rise to a large diagonal #1, went on to end as the  apical recurrent branch.  There was no significant disease in the left anterior descending or its branches.  Circumflex vessel:  The circumflex vessel was a moderate sized vessel and gave rise to a large bifurcating OM-1, small OM-2, and went on to end as an AV groove vessel.  There was no significant disease in the circumflex or its branches.  Right coronary artery:  The right coronary artery was dominant for the posterior circulation, gave rise to a small RV marginal #1, small RV marginal #2, moderate sized PDA and a large PL branch.  There was no significant disease in the right coronary artery or its branches.  IMPRESSION: 1. Normal coronary angiography. 2. Normal ventriculogram.  RECOMMENDATIONS:  Consider other etiologies including gastritis for chest pain.  Shortness of breath most likely representation of deconditioning and hypertension.  I will see the patient back in my office in two weeks. DD:  10/15/00 TD:  10/17/00 Job: [id number]

## 2010-11-08 NOTE — Op Note (Signed)
NAMEMONTE, BRONDER            ACCOUNT NO.:  [id number]   MEDICAL RECORD NO.:  [id number]          PATIENT TYPE:  INP   LOCATION:  NA                           FACILITY:  [facility]   PHYSICIAN:  Nadara Mustard, M.D.   DATE OF BIRTH:  1950-09-01   DATE OF PROCEDURE:  03/27/2004  DATE OF DISCHARGE:                                 OPERATIVE REPORT   PREOPERATIVE DIAGNOSIS:  Osteoarthritis left knee.   POSTOPERATIVE DIAGNOSIS:  Osteoarthritis left knee.   PROCEDURE:  Left total knee arthroplasty with a #9 posterior stabilized  Osteonics Scorpio femur, #7 tibia, #7 patella with 10 mm poly tray.   SURGEON:  Nadara Mustard, M.D.   ASSISTANT:  Vanita Panda. Magnus Ivan, M.D.   ANESTHESIA:  General LMA plus femoral block.   ESTIMATED BLOOD LOSS:  Minimal.   ANTIBIOTICS:  Kefzol 1 g.   TOURNIQUET TIME:  74 minutes at 350 mmHg.   DISPOSITION:  To PACU in stable condition.   INDICATIONS FOR PROCEDURE:  The patient is a 60 year old woman who was  status post a right total knee arthroplasty.  The patient progressed well  with her right total knee arthroplasty.  She has persistent pain with  activities of daily living with her left knee and presents at this time for  left total knee arthroplasty.  The risks and benefits were discussed  including infection, neurovascular injury, persistent pain, need for  additional surgery.  The patient states she understands and wishes to  proceed at this time.   DESCRIPTION OF PROCEDURE:  The patient underwent a femoral block in the  holding area.  The patient was then brought to the operating room and  underwent a general LMA anesthetic.  After adequate level of anesthesia  obtained, the patient's left lower extremity was prepped using DuraPrep,  draped in  sterile field and Ioban was used to cover all exposed skin.  The  knee was flexed and tourniquet inflated to 350 mmHg at the thigh.  A midline  incision was made and a medial parapatellar  retinacular incision was then  made.  Attention was first focused on the femur.  The intramedullary guide  rod was first drilled and divided and inserted up the femur with a 10 mm  distal cutting block placed.  This was set for 10 mm and additional 2 mm was  taken.  Attention was then focused on the patella.  The resurfacing patella  was used and 10 mm was taken off the patella and the drill holes were made  for the #7 patella.  Attention was then focused on the tibia.  The external  alignment guide was used and the 10/4 stylus was used with the 4 mm taken  off the medial tibial plateau with an additional 2 mm taken.  This was  aligned up for it to be neutral in the AP and lateral axis.  The 5 degree  posterior block was used.  The tibial cut was made and attention was focused  on the femur.  The femur sizing cutting block was then placed and this was  sized for  a size 9.  This was pinned and the size 9 cuts were made.  The  Chamfer cuts were then made for the posterior stabilized 9 femur. Trial  components were then placed with the #9 femur, 7 tibial tray and 10 mm poly.  The patient's knee was placed through a full range of motion with good  ligament stability and full range of motion with full extension.  This was  marked for rotation.  External alignment was then again checked and the keel  cuts were made for the #7 pressfit.  The wound was then irrigated with pulse  lavage and the femoral and tibial components were cemented in place as well  as the patella components cemented in place.  Loose cement was removed and  the poly tray was then inserted and the knee was held in extension while the  cement hardened.  All loose cement was removed and the tourniquet was  deflated and hemostasis was obtained.  Total tourniquet time was 74 minutes.  After hemostasis was obtained, the knee was placed through range of motion  and the lateral release was performed to maintain midline stability of  the  patella tracking.  The deep fascial layers were closed using #1 Vicryl,  subcutaneous closed using 2-0 Vicryl, the skin was closed using approximated  staples.  The wound was covered with Adaptic orthopedic sponges, ABD  dressing, Webril and a Coban dressing.  The patient was extubated and taken  to the PACU in stable condition.       MVD/MEDQ  D:  03/27/2004  T:  03/27/2004  Job:  [id number]

## 2010-11-08 NOTE — Discharge Summary (Signed)
[name], [name]                      ACCOUNT NO.:  [id number]   MEDICAL RECORD NO.:  [id number]                   PATIENT TYPE:  INP   LOCATION:  5031                                 FACILITY:  [facility]   PHYSICIAN:  Nadara Mustard, M.D.                DATE OF BIRTH:  04/30/1951   DATE OF ADMISSION:  11/02/2003  DATE OF DISCHARGE:  11/06/2003                                 DISCHARGE SUMMARY   DISCHARGE DIAGNOSIS:  Osteoarthritis right knee.   PROCEDURE:  Right total knee arthroplasty.   CONDITION ON DISCHARGE:  Discharged to rehab in stable condition.  Plan to  follow up in the office in two weeks.   HISTORY OF PRESENT ILLNESS:  The patient is a 60 year old woman with  osteoarthritis of her right knee.  The patient has been unable to perform  activities of daily living due to right knee pain and she presents at this  time for right total knee arthroplasty.   HOSPITAL COURSE:  The patient's hospital course was essentially  unremarkable.  She underwent a right total knee arthroplasty on Nov 02, 2003.  She received Scorpio Osteonics component #9 femur, #7 tibia, #7  patella, and a 10 mm poly tray.  She received Kefzol for infection  prophylaxis and the tourniquet time was 7 minutes.  The patient's  postoperative course was unremarkable.  She had a hemoglobin of 10.8 on  postoperative day #1.  she was started on physical therapy, was started on  Coumadin for DVT prophylaxis and continued on the Kefzol for infection  prophylaxis for 24 hours.  The patient progressed very slowly with physical  therapy, she was ambulating 8 feet on postoperative day #4 and she was  discharged to rehab in stable condition on Nov 06, 2003, to follow up in the  office in two weeks after discharge from the rehab service.                                                Nadara Mustard, M.D.    MVD/MEDQ  D:  11/23/2003  T:  11/24/2003  Job:  [id number]

## 2010-11-08 NOTE — H&P (Signed)
[facility]  Patient:    [name], [name] Visit Number: [id number] MRN: [id number]          Service Type: MED Location: [phone] Attending Physician:  Bonnetta Barry Dictated by:   Velora Heckler, M.D. Admit Date:  05/03/2001   CC:         Talmadge Coventry, M.D.  Georgina Peer, M.D.   History and Physical  REASON FOR ADMISSION:  Acute cholecystitis, cholelithiasis.  HISTORY OF PRESENT ILLNESS:  The patient is a 60 year old white female who presents to the emergency department with a nine-hour history of unrelenting upper abdominal pain.  The patient had had intermittent such episodes dating back to July 2002.  She had been treated with proton pump inhibitors and H2 blockers.  However, today the pain persisted for approximately nine hours, causing significant discomfort and nausea.  The patient presented to the emergency department when she was not able to be seen by her primary care physician.  The patient was evaluated by the emergency room physician. Laboratory studies showed elevated liver function tests.  Ultrasound of the abdomen was obtained, showing multiple gallstones with gallbladder wall thickening consistent with acute cholecystitis.  General surgery was consulted for further evaluation and management.  PAST MEDICAL HISTORY: 1. Status post total abdominal hysterectomy in 1989. 2. Status post thyroid lobectomy in 1990, by Dr. Berna Bue. 3. Status post tonsillectomy in 1975. 4. Status post appendectomy as an 35-month-old. 5. History of hiatal hernia.  MEDICATIONS: 1. Synthroid 0.088 mg daily. 2. Nexium 40 mg daily. 3. Zantac 75 1 b.i.d.  ALLERGIES:  No known drug allergies.  SOCIAL HISTORY:  The patient works in the Financial controller at [employer].  She has a history of tobacco use but quit 20 years ago.  She does not drink alcohol.  REVIEW OF SYSTEMS:  Largely unremarkable.  FAMILY HISTORY:  Notable for  gallstones in multiple siblings.  PHYSICAL EXAMINATION:  GENERAL:  [age]year-old well-developed, well-nourished white female in mild discomfort on a stretcher in the emergency department.  VITAL SIGNS:  Temperature 97.3, pulse 67, respirations 24, blood pressure 108/57.  HEENT:  Normocephalic, atraumatic.  Sclerae clear.  Dentition is good.  NECK:  Supple.  Without masses.  There is a well-healed surgical wound on the upper sternum transversely.  This is quite low for thyroid lobectomy.  LUNGS:  Clear to auscultation.  CARDIAC:  Regular rate and rhythm with a grade 1 systolic ejection murmur.  ABDOMEN:  Soft.  There are bowel sounds present.  There is tenderness to palpation and percussion in the right upper quadrant.  There is voluntary guarding.  There is a Murphys sign present.  There is not a palpable mass. There is a well-healed Pfannenstiel incision.  There is no costovertebral angle tenderness.  EXTREMITIES:  Nontender, without edema.  NEUROLOGIC:  Alert and oriented to person, place, and time, without focal gross neurologic deficit.  LABORATORY DATA:  Complete blood count shows a white count 10.1, hemoglobin 12.9, platelet count 274,000.  Differential is notable for 78% segmented neutrophils, 14% lymphocytes.  Chemistry profile shows the following abnormalities:  Glucose 151, SGOT 222, SGPT 101, alkaline phosphatase 122, total bilirubin 1.4.  Lipase normal at 46.  Urinalysis is benign.  Ultrasound of the abdomen shows multiple gallstones with thick-walled gallbladder consistent with acute cholecystitis.  No ductal dilatation is identified.  IMPRESSION:  Acute cholecystitis and cholelithiasis, rule out choledocholithiasis.  PLAN: 1. Admission to [facility]. 2. Initiation of empiric  antibiotic therapy. 3. Complete preoperative assessment with chest x-ray, EKG. 4. Repeat liver function tests in a.m. of May 04, 2001, as the patient    may  require gastroenterology consultation and preoperative ERCP. 5. Laparoscopic cholecystectomy this admission. Dictated by:   Velora Heckler, M.D. Attending Physician:  Bonnetta Barry DD:  05/03/01 TD:  05/04/01 Job: [id number]

## 2011-07-29 ENCOUNTER — Other Ambulatory Visit (HOSPITAL_COMMUNITY): Payer: Self-pay | Admitting: Family Medicine

## 2011-07-31 ENCOUNTER — Ambulatory Visit (HOSPITAL_COMMUNITY)
Admission: RE | Admit: 2011-07-31 | Discharge: 2011-07-31 | Disposition: A | Discharge status: Home / Self Care | Payer: Medicare Other | Source: Ambulatory Visit | Attending: Family Medicine | Admitting: Family Medicine

## 2011-07-31 DIAGNOSIS — R109 Unspecified abdominal pain: Secondary | ICD-10-CM | POA: Insufficient documentation

## 2012-05-25 DIAGNOSIS — K219 Gastro-esophageal reflux disease without esophagitis: Secondary | ICD-10-CM

## 2012-05-25 DIAGNOSIS — E039 Hypothyroidism, unspecified: Secondary | ICD-10-CM

## 2012-05-25 DIAGNOSIS — G2581 Restless legs syndrome: Secondary | ICD-10-CM

## 2012-05-25 DIAGNOSIS — E559 Vitamin D deficiency, unspecified: Secondary | ICD-10-CM

## 2012-05-25 HISTORY — DX: Restless legs syndrome: G25.81

## 2012-05-25 HISTORY — DX: Vitamin D deficiency, unspecified: E55.9

## 2012-05-25 HISTORY — DX: Hypothyroidism, unspecified: E03.9

## 2012-05-25 HISTORY — DX: Gastro-esophageal reflux disease without esophagitis: K21.9

## 2012-07-01 ENCOUNTER — Other Ambulatory Visit: Payer: Self-pay | Admitting: Family Medicine

## 2012-07-01 DIAGNOSIS — Z1231 Encounter for screening mammogram for malignant neoplasm of breast: Secondary | ICD-10-CM

## 2012-07-30 ENCOUNTER — Ambulatory Visit
Admission: RE | Admit: 2012-07-30 | Discharge: 2012-07-30 | Disposition: A | Discharge status: Home / Self Care | Payer: Medicare Other | Source: Ambulatory Visit | Attending: Family Medicine | Admitting: Family Medicine

## 2012-07-30 DIAGNOSIS — Z1231 Encounter for screening mammogram for malignant neoplasm of breast: Secondary | ICD-10-CM

## 2013-03-21 ENCOUNTER — Other Ambulatory Visit: Payer: Self-pay | Admitting: Family Medicine

## 2013-03-21 DIAGNOSIS — M810 Age-related osteoporosis without current pathological fracture: Secondary | ICD-10-CM

## 2013-04-29 ENCOUNTER — Ambulatory Visit
Admission: RE | Admit: 2013-04-29 | Discharge: 2013-04-29 | Disposition: A | Discharge status: Home / Self Care | Payer: Medicare Other | Source: Ambulatory Visit | Attending: Family Medicine | Admitting: Family Medicine

## 2013-04-29 DIAGNOSIS — M810 Age-related osteoporosis without current pathological fracture: Secondary | ICD-10-CM

## 2013-05-02 DIAGNOSIS — M810 Age-related osteoporosis without current pathological fracture: Secondary | ICD-10-CM | POA: Insufficient documentation

## 2013-05-02 HISTORY — DX: Age-related osteoporosis without current pathological fracture: M81.0

## 2013-05-27 DIAGNOSIS — F329 Major depressive disorder, single episode, unspecified: Secondary | ICD-10-CM | POA: Insufficient documentation

## 2013-05-27 DIAGNOSIS — F32A Depression, unspecified: Secondary | ICD-10-CM | POA: Insufficient documentation

## 2013-12-02 DIAGNOSIS — E119 Type 2 diabetes mellitus without complications: Secondary | ICD-10-CM

## 2013-12-02 HISTORY — DX: Type 2 diabetes mellitus without complications: E11.9

## 2014-01-30 ENCOUNTER — Other Ambulatory Visit: Payer: Self-pay

## 2014-01-30 DIAGNOSIS — Z1231 Encounter for screening mammogram for malignant neoplasm of breast: Secondary | ICD-10-CM

## 2014-02-03 ENCOUNTER — Ambulatory Visit
Admission: RE | Admit: 2014-02-03 | Discharge: 2014-02-03 | Disposition: A | Discharge status: Home / Self Care | Payer: Commercial Managed Care - HMO | Source: Ambulatory Visit

## 2014-02-03 DIAGNOSIS — Z1231 Encounter for screening mammogram for malignant neoplasm of breast: Secondary | ICD-10-CM

## 2014-02-05 ENCOUNTER — Other Ambulatory Visit: Payer: Self-pay | Admitting: *Deleted

## 2014-02-16 ENCOUNTER — Encounter: Payer: Self-pay | Admitting: *Deleted

## 2014-02-16 DIAGNOSIS — E785 Hyperlipidemia, unspecified: Secondary | ICD-10-CM | POA: Insufficient documentation

## 2014-02-16 DIAGNOSIS — I10 Essential (primary) hypertension: Secondary | ICD-10-CM | POA: Insufficient documentation

## 2014-04-24 ENCOUNTER — Encounter: Payer: Self-pay | Admitting: Internal Medicine

## 2014-06-02 HISTORY — DX: Morbid (severe) obesity due to excess calories: E66.01

## 2015-02-23 ENCOUNTER — Other Ambulatory Visit: Payer: Self-pay

## 2015-02-23 DIAGNOSIS — Z1231 Encounter for screening mammogram for malignant neoplasm of breast: Secondary | ICD-10-CM

## 2015-03-02 ENCOUNTER — Ambulatory Visit
Admission: RE | Admit: 2015-03-02 | Discharge: 2015-03-02 | Disposition: A | Discharge status: Home / Self Care | Payer: PPO | Source: Ambulatory Visit

## 2015-03-02 DIAGNOSIS — Z1231 Encounter for screening mammogram for malignant neoplasm of breast: Secondary | ICD-10-CM

## 2015-08-03 DIAGNOSIS — M4806 Spinal stenosis, lumbar region: Secondary | ICD-10-CM | POA: Diagnosis not present

## 2015-08-03 DIAGNOSIS — M47816 Spondylosis without myelopathy or radiculopathy, lumbar region: Secondary | ICD-10-CM | POA: Diagnosis not present

## 2015-08-03 DIAGNOSIS — M940 Chondrocostal junction syndrome [Tietze]: Secondary | ICD-10-CM | POA: Diagnosis not present

## 2015-08-03 DIAGNOSIS — M5136 Other intervertebral disc degeneration, lumbar region: Secondary | ICD-10-CM | POA: Diagnosis not present

## 2015-08-03 DIAGNOSIS — M25511 Pain in right shoulder: Secondary | ICD-10-CM | POA: Diagnosis not present

## 2015-08-23 DIAGNOSIS — M546 Pain in thoracic spine: Secondary | ICD-10-CM | POA: Diagnosis not present

## 2015-08-23 DIAGNOSIS — G8929 Other chronic pain: Secondary | ICD-10-CM | POA: Diagnosis not present

## 2015-08-23 DIAGNOSIS — I10 Essential (primary) hypertension: Secondary | ICD-10-CM | POA: Diagnosis not present

## 2015-08-23 DIAGNOSIS — E119 Type 2 diabetes mellitus without complications: Secondary | ICD-10-CM | POA: Diagnosis not present

## 2015-08-28 DIAGNOSIS — M47816 Spondylosis without myelopathy or radiculopathy, lumbar region: Secondary | ICD-10-CM | POA: Diagnosis not present

## 2015-09-14 DIAGNOSIS — E875 Hyperkalemia: Secondary | ICD-10-CM | POA: Diagnosis not present

## 2015-09-14 DIAGNOSIS — I10 Essential (primary) hypertension: Secondary | ICD-10-CM | POA: Diagnosis not present

## 2015-09-15 DIAGNOSIS — M546 Pain in thoracic spine: Secondary | ICD-10-CM | POA: Diagnosis not present

## 2015-09-15 DIAGNOSIS — M5136 Other intervertebral disc degeneration, lumbar region: Secondary | ICD-10-CM | POA: Diagnosis not present

## 2015-09-15 DIAGNOSIS — M47816 Spondylosis without myelopathy or radiculopathy, lumbar region: Secondary | ICD-10-CM | POA: Diagnosis not present

## 2015-09-15 DIAGNOSIS — M4806 Spinal stenosis, lumbar region: Secondary | ICD-10-CM | POA: Diagnosis not present

## 2015-09-20 ENCOUNTER — Emergency Department (HOSPITAL_COMMUNITY)
Admission: EM | Admit: 2015-09-20 | Discharge: 2015-09-20 | Disposition: A | Discharge status: Home / Self Care | Payer: PPO | Attending: Physician Assistant | Admitting: Physician Assistant

## 2015-09-20 ENCOUNTER — Encounter (HOSPITAL_COMMUNITY): Payer: Self-pay | Admitting: *Deleted

## 2015-09-20 ENCOUNTER — Emergency Department (HOSPITAL_COMMUNITY): Payer: PPO

## 2015-09-20 DIAGNOSIS — R1110 Vomiting, unspecified: Secondary | ICD-10-CM | POA: Insufficient documentation

## 2015-09-20 DIAGNOSIS — R42 Dizziness and giddiness: Secondary | ICD-10-CM | POA: Insufficient documentation

## 2015-09-20 DIAGNOSIS — R0609 Other forms of dyspnea: Secondary | ICD-10-CM | POA: Insufficient documentation

## 2015-09-20 DIAGNOSIS — G2581 Restless legs syndrome: Secondary | ICD-10-CM | POA: Diagnosis not present

## 2015-09-20 DIAGNOSIS — F329 Major depressive disorder, single episode, unspecified: Secondary | ICD-10-CM | POA: Diagnosis not present

## 2015-09-20 DIAGNOSIS — Z87891 Personal history of nicotine dependence: Secondary | ICD-10-CM | POA: Insufficient documentation

## 2015-09-20 DIAGNOSIS — J111 Influenza due to unidentified influenza virus with other respiratory manifestations: Secondary | ICD-10-CM | POA: Diagnosis not present

## 2015-09-20 DIAGNOSIS — I10 Essential (primary) hypertension: Secondary | ICD-10-CM | POA: Diagnosis not present

## 2015-09-20 DIAGNOSIS — E079 Disorder of thyroid, unspecified: Secondary | ICD-10-CM | POA: Insufficient documentation

## 2015-09-20 DIAGNOSIS — Z79899 Other long term (current) drug therapy: Secondary | ICD-10-CM | POA: Diagnosis not present

## 2015-09-20 DIAGNOSIS — K219 Gastro-esophageal reflux disease without esophagitis: Secondary | ICD-10-CM | POA: Insufficient documentation

## 2015-09-20 DIAGNOSIS — R197 Diarrhea, unspecified: Secondary | ICD-10-CM | POA: Insufficient documentation

## 2015-09-20 DIAGNOSIS — J069 Acute upper respiratory infection, unspecified: Secondary | ICD-10-CM

## 2015-09-20 DIAGNOSIS — R0602 Shortness of breath: Secondary | ICD-10-CM | POA: Diagnosis not present

## 2015-09-20 DIAGNOSIS — I200 Unstable angina: Secondary | ICD-10-CM

## 2015-09-20 DIAGNOSIS — Z7982 Long term (current) use of aspirin: Secondary | ICD-10-CM | POA: Diagnosis not present

## 2015-09-20 DIAGNOSIS — R531 Weakness: Secondary | ICD-10-CM | POA: Diagnosis not present

## 2015-09-20 DIAGNOSIS — R0600 Dyspnea, unspecified: Secondary | ICD-10-CM

## 2015-09-20 DIAGNOSIS — R05 Cough: Secondary | ICD-10-CM | POA: Diagnosis not present

## 2015-09-20 DIAGNOSIS — R404 Transient alteration of awareness: Secondary | ICD-10-CM | POA: Diagnosis not present

## 2015-09-20 HISTORY — DX: Acute upper respiratory infection, unspecified: J06.9

## 2015-09-20 HISTORY — DX: Dyspnea, unspecified: R06.00

## 2015-09-20 HISTORY — DX: Unstable angina: I20.0

## 2015-09-20 HISTORY — DX: Other forms of dyspnea: R06.09

## 2015-09-20 LAB — COMPREHENSIVE METABOLIC PANEL
ALK PHOS: 95 U/L (ref 38–126)
ALT: 34 U/L (ref 14–54)
ANION GAP: 12 (ref 5–15)
AST: 32 U/L (ref 15–41)
Albumin: 3.8 g/dL (ref 3.5–5.0)
BILIRUBIN TOTAL: 0.6 mg/dL (ref 0.3–1.2)
BUN: 11 mg/dL (ref 6–20)
CO2: 23 mmol/L (ref 22–32)
CREATININE: 0.66 mg/dL (ref 0.44–1.00)
Calcium: 9 mg/dL (ref 8.9–10.3)
Chloride: 108 mmol/L (ref 101–111)
Glucose, Bld: 112 mg/dL — ABNORMAL HIGH (ref 65–99)
Potassium: 4.1 mmol/L (ref 3.5–5.1)
Sodium: 143 mmol/L (ref 135–145)
Total Protein: 7.1 g/dL (ref 6.5–8.1)

## 2015-09-20 LAB — URINALYSIS, ROUTINE W REFLEX MICROSCOPIC
Bilirubin Urine: NEGATIVE
Glucose, UA: NEGATIVE mg/dL
Hgb urine dipstick: NEGATIVE
Ketones, ur: NEGATIVE mg/dL
NITRITE: NEGATIVE
PH: 7.5 (ref 5.0–8.0)
Protein, ur: NEGATIVE mg/dL
SPECIFIC GRAVITY, URINE: 1.024 (ref 1.005–1.030)

## 2015-09-20 LAB — CBC WITH DIFFERENTIAL/PLATELET
Basophils Absolute: 0 10*3/uL (ref 0.0–0.1)
Basophils Relative: 0 %
EOS PCT: 5 %
Eosinophils Absolute: 0.3 10*3/uL (ref 0.0–0.7)
HCT: 41.3 % (ref 36.0–46.0)
HEMOGLOBIN: 14.1 g/dL (ref 12.0–15.0)
LYMPHS ABS: 1.6 10*3/uL (ref 0.7–4.0)
LYMPHS PCT: 29 %
MCH: 30.3 pg (ref 26.0–34.0)
MCHC: 34.1 g/dL (ref 30.0–36.0)
MCV: 88.6 fL (ref 78.0–100.0)
Monocytes Absolute: 0.4 10*3/uL (ref 0.1–1.0)
Monocytes Relative: 7 %
Neutro Abs: 3.2 10*3/uL (ref 1.7–7.7)
Neutrophils Relative %: 59 %
PLATELETS: 222 10*3/uL (ref 150–400)
RBC: 4.66 MIL/uL (ref 3.87–5.11)
RDW: 13.4 % (ref 11.5–15.5)
WBC: 5.5 10*3/uL (ref 4.0–10.5)

## 2015-09-20 LAB — I-STAT TROPONIN, ED
Troponin i, poc: 0 ng/mL (ref 0.00–0.08)
Troponin i, poc: 0 ng/mL (ref 0.00–0.08)

## 2015-09-20 LAB — URINE MICROSCOPIC-ADD ON

## 2015-09-20 MED ORDER — BENZONATATE 100 MG PO CAPS: 100.0000 mg | ORAL_CAPSULE | Freq: Three times a day (TID) | ORAL | Status: DC | PRN

## 2015-09-20 MED ORDER — ONDANSETRON HCL 4 MG PO TABS: 4.0000 mg | ORAL_TABLET | Freq: Three times a day (TID) | ORAL | Status: DC | PRN

## 2015-09-20 MED ORDER — GUAIFENESIN-CODEINE 100-10 MG/5ML PO SOLN
5.0000 mL | Freq: Once | ORAL | Status: AC
  Administered 2015-09-20: 5 mL via ORAL
  Filled 2015-09-20: qty 5

## 2015-09-20 MED ORDER — GUAIFENESIN-CODEINE 100-10 MG/5ML PO SOLN: 5.0000 mL | Freq: Four times a day (QID) | ORAL | Status: DC | PRN

## 2015-09-20 MED ORDER — SODIUM CHLORIDE 0.9 % IV BOLUS (SEPSIS)
1000.0000 mL | Freq: Once | INTRAVENOUS | Status: AC
  Administered 2015-09-20: 1000 mL via INTRAVENOUS

## 2015-09-20 NOTE — ED Notes (Signed)
Pt transported to Xray.  Will attempt blood draw after pt returns.

## 2015-09-20 NOTE — ED Provider Notes (Signed)
CSN: [id number]     Arrival date & time 09/20/15  1039 History   First MD Initiated Contact with Patient 09/20/15 1047     Chief Complaint  Patient presents with  . Cough     (Consider location/radiation/quality/duration/timing/severity/associated sxs/prior Treatment) HPI   Patient is a 65 year old obese female with history of GERD, thyroid disease, hypertension, depression presenting today with cough fatigue and nausea. Patient's symptoms are consistent with viral illnesses going around the community. 2 days ago she had vomiting and diarrhea. Then she developed cough, congestion, fatigue, muscle aches. Patient went to PCP today with the symptoms have been going on for last week. She reports that he did EKG and sent her here for further evaluation.  We reviewed the EKG that was sent over here. Patient got mild ST flattening in V1 through V3, not consistent with any kind of acute ischemia.    Past Medical History  Diagnosis Date  . Hypertension   . Thyroid disease   . Gastro-esophageal reflux   . Hyperlipidemia   . RLS (restless legs syndrome)   . Depression   . Vitamin B 12 deficiency    Past Surgical History  Procedure Laterality Date  . Abdominal hysterectomy    . Tonsillectomy    . Appendectomy    . Thyroidectomy    . Cholecystectomy    . Cataract extraction    . Joint replacement      2 total knee replacement   Family History  Problem Relation Age of Onset  . Cancer Mother   . Hypertension Mother   . Cancer Father   . Diabetes Sister   . Heart disease Brother   . Diabetes Brother   . Diabetes Brother    Social History  Substance Use Topics  . Smoking status: Former Smoker    Quit date: 02/17/1987  . Smokeless tobacco: None  . Alcohol Use: No   OB History    No data available     Review of Systems  Constitutional: Positive for fatigue. Negative for fever and activity change.  HENT: Positive for congestion.   Respiratory: Positive for cough and  shortness of breath.   Cardiovascular: Negative for chest pain.  Gastrointestinal: Positive for vomiting and diarrhea. Negative for abdominal pain.  Musculoskeletal: Negative for back pain.  Neurological: Positive for light-headedness.      Allergies  Review of patient's allergies indicates no known allergies.  Home Medications   Prior to Admission medications   Medication Sig Start Date End Date Taking? Authorizing Provider  aspirin 81 MG tablet Take 324 mg by mouth once.   Yes Historical Provider, MD  clonazePAM (KLONOPIN) 1 MG tablet Take 1 mg by mouth at bedtime as needed. Restless legs. 10/10/13  Yes Historical Provider, MD  losartan (COZAAR) 100 MG tablet Take 100 mg by mouth daily.   Yes Historical Provider, MD  omeprazole (PRILOSEC) 40 MG capsule Take 40 mg by mouth at bedtime. 08/24/15  Yes Historical Provider, MD  levothyroxine (SYNTHROID) 50 MCG tablet Take 50 mcg by mouth. 03/21/13 03/21/14  Historical Provider, MD  omeprazole (PRILOSEC) 40 MG capsule Take 40 mg by mouth. 03/21/13 03/21/14  Historical Provider, MD   BP 130/92 mmHg  Pulse 83  Temp(Src) 97.9 F (36.6 C) (Oral)  Resp 16  Ht 5\' 1"  (1.549 m)  Wt 276 lb (125.193 kg)  BMI 52.18 kg/m2  SpO2 97% Physical Exam  Constitutional: She is oriented to person, place, and time. She appears well-developed and well-nourished.  HENT:  Head: Normocephalic and atraumatic.  Dry mucous membranes.  Eyes: Conjunctivae are normal. Right eye exhibits no discharge.  Neck: Neck supple.  Cardiovascular: Normal rate, regular rhythm and normal heart sounds.   No murmur heard. Pulmonary/Chest: Effort normal and breath sounds normal. She has no wheezes. She has no rales.  Mild tachypnea.  Abdominal: Soft. She exhibits no distension. There is no tenderness.  Musculoskeletal: Normal range of motion. She exhibits no edema.  Neurological: She is oriented to person, place, and time. No cranial nerve deficit.  Skin: Skin is warm and dry. No  rash noted. She is not diaphoretic.  Psychiatric: She has a normal mood and affect. Her behavior is normal.  Nursing note and vitals reviewed.   ED Course  Procedures (including critical care time) Labs Review Labs Reviewed  COMPREHENSIVE METABOLIC PANEL - Abnormal; Notable for the following:    Glucose, Bld 112 (*)    All other components within normal limits  URINALYSIS, ROUTINE W REFLEX MICROSCOPIC (NOT AT [facility]) - Abnormal; Notable for the following:    APPearance CLOUDY (*)    Leukocytes, UA SMALL (*)    All other components within normal limits  URINE MICROSCOPIC-ADD ON - Abnormal; Notable for the following:    Squamous Epithelial / LPF 6-30 (*)    Bacteria, UA MANY (*)    All other components within normal limits  URINE CULTURE  CBC WITH DIFFERENTIAL/PLATELET  INFLUENZA PANEL BY PCR (TYPE A & B, H1N1)  I-STAT TROPOININ, ED  Randolm Idol, ED    Imaging Review Dg Chest 2 View  09/20/2015  CLINICAL DATA:  Cough, shortness of breath. EXAM: CHEST  2 VIEW COMPARISON:  Oct 22, 2013. FINDINGS: The heart size and mediastinal contours are within normal limits. Both lungs are clear. No pneumothorax or pleural effusion is noted. The visualized skeletal structures are unremarkable. IMPRESSION: No active cardiopulmonary disease. Electronically Signed   By: Marijo Conception, M.D.   On: 09/20/2015 11:52   I have personally reviewed and evaluated these images and lab results as part of my medical decision-making.   EKG Interpretation   Date/Time:  Thursday September 20 2015 11:24:12 EDT Ventricular Rate:  77 PR Interval:  187 QRS Duration: 109 QT Interval:  397 QTC Calculation: 449 R Axis:   39 Text Interpretation:  Sinus rhythm Low voltage, precordial leads  Borderline T abnormalities, anterior leads no acute ischemia T wave  cflannenting unchanged from prior No significant change since last tracing  Confirmed by Gerald Leitz ([id number]) on 09/20/2015 3:05:31 PM      MDM    Final diagnoses:  Viral URI    Patient is a 65 year old female presenting with 1 week of viral symptoms. Patient reports nausea vomiting diarrhea associated with cough congestion and fatigue. Patient has myalgias. This is consistent with viral syndrome going around the community. We will test for flu. We'll get chest x-ray, UA. She has had occasionally chest pain for months. We will get troponin and repeat EKG. EKG on arrival here does not appear to be ischemic.  3:06 PM Labs normal. Cxr shows no pna. Will do delta trop. Will treat with symptomatic care for cough and congestion.  (UA indeterminant, will await for urine cutlure)  Analuisa Tudor Julio Alm, MD 09/20/15 1506

## 2015-09-20 NOTE — ED Notes (Signed)
Patient pulse ox while ambulating 100% heart rate 96

## 2015-09-20 NOTE — Discharge Instructions (Signed)
Please return if you have any concerns.  Upper Respiratory Infection, Adult Most upper respiratory infections (URIs) are a viral infection of the air passages leading to the lungs. A URI affects the nose, throat, and upper air passages. The most common type of URI is nasopharyngitis and is typically referred to as "the common cold." URIs run their course and usually go away on their own. Most of the time, a URI does not require medical attention, but sometimes a bacterial infection in the upper airways can follow a viral infection. This is called a secondary infection. Sinus and middle ear infections are common types of secondary upper respiratory infections. Bacterial pneumonia can also complicate a URI. A URI can worsen asthma and chronic obstructive pulmonary disease (COPD). Sometimes, these complications can require emergency medical care and may be life threatening.  CAUSES Almost all URIs are caused by viruses. A virus is a type of germ and can spread from one person to another.  RISKS FACTORS You may be at risk for a URI if:   You smoke.   You have chronic heart or lung disease.  You have a weakened defense (immune) system.   You are very young or very old.   You have nasal allergies or asthma.  You work in crowded or poorly ventilated areas.  You work in health care facilities or schools. SIGNS AND SYMPTOMS  Symptoms typically develop 2-3 days after you come in contact with a cold virus. Most viral URIs last 7-10 days. However, viral URIs from the influenza virus (flu virus) can last 14-18 days and are typically more severe. Symptoms may include:   Runny or stuffy (congested) nose.   Sneezing.   Cough.   Sore throat.   Headache.   Fatigue.   Fever.   Loss of appetite.   Pain in your forehead, behind your eyes, and over your cheekbones (sinus pain).  Muscle aches.  DIAGNOSIS  Your health care provider may diagnose a URI by:  Physical exam.  Tests  to check that your symptoms are not due to another condition such as:  Strep throat.  Sinusitis.  Pneumonia.  Asthma. TREATMENT  A URI goes away on its own with time. It cannot be cured with medicines, but medicines may be prescribed or recommended to relieve symptoms. Medicines may help:  Reduce your fever.  Reduce your cough.  Relieve nasal congestion. HOME CARE INSTRUCTIONS   Take medicines only as directed by your health care provider.   Gargle warm saltwater or take cough drops to comfort your throat as directed by your health care provider.  Use a warm mist humidifier or inhale steam from a shower to increase air moisture. This may make it easier to breathe.  Drink enough fluid to keep your urine clear or pale yellow.   Eat soups and other clear broths and maintain good nutrition.   Rest as needed.   Return to work when your temperature has returned to normal or as your health care provider advises. You may need to stay home longer to avoid infecting others. You can also use a face mask and careful hand washing to prevent spread of the virus.  Increase the usage of your inhaler if you have asthma.   Do not use any tobacco products, including cigarettes, chewing tobacco, or electronic cigarettes. If you need help quitting, ask your health care provider. PREVENTION  The best way to protect yourself from getting a cold is to practice good hygiene.   Avoid  oral or hand contact with people with cold symptoms.   Wash your hands often if contact occurs.  There is no clear evidence that vitamin C, vitamin E, echinacea, or exercise reduces the chance of developing a cold. However, it is always recommended to get plenty of rest, exercise, and practice good nutrition.  SEEK MEDICAL CARE IF:   You are getting worse rather than better.   Your symptoms are not controlled by medicine.   You have chills.  You have worsening shortness of breath.  You have brown or  red mucus.  You have yellow or brown nasal discharge.  You have pain in your face, especially when you bend forward.  You have a fever.  You have swollen neck glands.  You have pain while swallowing.  You have white areas in the back of your throat. SEEK IMMEDIATE MEDICAL CARE IF:   You have severe or persistent:  Headache.  Ear pain.  Sinus pain.  Chest pain.  You have chronic lung disease and any of the following:  Wheezing.  Prolonged cough.  Coughing up blood.  A change in your usual mucus.  You have a stiff neck.  You have changes in your:  Vision.  Hearing.  Thinking.  Mood. MAKE SURE YOU:   Understand these instructions.  Will watch your condition.  Will get help right away if you are not doing well or get worse.   This information is not intended to replace advice given to you by your health care provider. Make sure you discuss any questions you have with your health care provider.   Document Released: 12/03/2000 Document Revised: 10/24/2014 Document Reviewed: 09/14/2013 Elsevier Interactive Patient Education 2016 Elsevier Inc[employer]  Cough, Adult A cough helps to clear your throat and lungs. A cough may last only 2-3 weeks (acute), or it may last longer than 8 weeks (chronic). Many different things can cause a cough. A cough may be a sign of an illness or another medical condition. HOME CARE  Pay attention to any changes in your cough.  Take medicines only as told by your doctor.  If you were prescribed an antibiotic medicine, take it as told by your doctor. Do not stop taking it even if you start to feel better.  Talk with your doctor before you try using a cough medicine.  Drink enough fluid to keep your pee (urine) clear or pale yellow.  If the air is dry, use a cold steam vaporizer or humidifier in your home.  Stay away from things that make you cough at work or at home.  If your cough is worse at night, try using extra pillows to  raise your head up higher while you sleep.  Do not smoke, and try not to be around smoke. If you need help quitting, ask your doctor.  Do not have caffeine.  Do not drink alcohol.  Rest as needed. GET HELP IF:  You have new problems (symptoms).  You cough up yellow fluid (pus).  Your cough does not get better after 2-3 weeks, or your cough gets worse.  Medicine does not help your cough and you are not sleeping well.  You have pain that gets worse or pain that is not helped with medicine.  You have a fever.  You are losing weight and you do not know why.  You have night sweats. GET HELP RIGHT AWAY IF:  You cough up blood.  You have trouble breathing.  Your heartbeat is very fast.  This information is not intended to replace advice given to you by your health care provider. Make sure you discuss any questions you have with your health care provider.   Document Released: 02/20/2011 Document Revised: 02/28/2015 Document Reviewed: 08/16/2014 Elsevier Interactive Patient Education [employer].

## 2015-09-20 NOTE — ED Notes (Signed)
Per EMS report: pt coming from her MD's office w/ c/o flu like symptoms x 1 week.  Pt has been experiencing a fever, chills, productive cough with a yellow sputum. Pt went to the doctor's office b/c her cough kept her up all night. Pt a/o x 4. The doctor's office did an EKG and determined it was abnormal but EMS's EKG did not show any abnormalities.

## 2015-09-21 LAB — URINE CULTURE

## 2015-10-26 DIAGNOSIS — M542 Cervicalgia: Secondary | ICD-10-CM | POA: Diagnosis not present

## 2015-10-30 ENCOUNTER — Ambulatory Visit (INDEPENDENT_AMBULATORY_CARE_PROVIDER_SITE_OTHER): Payer: PPO | Admitting: Neurology

## 2015-10-30 ENCOUNTER — Encounter: Payer: Self-pay | Admitting: Neurology

## 2015-10-30 VITALS — BP 118/66 | HR 76 | Ht 61.0 in | Wt 281.0 lb

## 2015-10-30 DIAGNOSIS — R430 Anosmia: Secondary | ICD-10-CM | POA: Diagnosis not present

## 2015-10-30 DIAGNOSIS — G501 Atypical facial pain: Secondary | ICD-10-CM

## 2015-10-30 DIAGNOSIS — R413 Other amnesia: Secondary | ICD-10-CM | POA: Diagnosis not present

## 2015-10-30 DIAGNOSIS — E538 Deficiency of other specified B group vitamins: Secondary | ICD-10-CM | POA: Diagnosis not present

## 2015-10-30 HISTORY — DX: Other amnesia: R41.3

## 2015-10-30 HISTORY — DX: Anosmia: R43.0

## 2015-10-30 HISTORY — DX: Atypical facial pain: G50.1

## 2015-10-30 MED ORDER — ALPRAZOLAM 0.5 MG PO TABS: ORAL_TABLET | ORAL | Status: DC

## 2015-10-30 NOTE — Progress Notes (Signed)
Reason for visit: Face pain  Referring physician: Dr. Adele Barthel is a 65 y.o. female  History of present illness:  Ms. [name] is a 65 year old right-handed white female with a history of chronic low back pain, followed by Dr. Nelva Bush. The patient has noted a two-year history of sharp shooting pains in the left superior frontal area that have become a bit more frequent over time. The patient will note about 3 episodes a week, but the episodes last only about 10 seconds and then clear. The patient has some soreness on the frontal aspect of the head as well that is persistent. The patient also notes a pressure sensation but also some numbness and tingling sensations in the upper face including the forehead, around the eye, and the cheek area. The patient denies any problems with speech or swallowing, she denies any ear pain or throat pain, she does have some neck pain and some pain into the shoulder blade area, but she denies any association with the sharp pains and with head movement. She denies pain in the back of the head. The patient denies any weakness or numbness of the extremities, she denies any balance issues or difficulty controlling the bowels or the bladder. About 3 years ago she indicates that she lost her sense of smell. The patient also reports a one-year history of some difficulty with memory. The patient reports short-term memory problems and difficulty remembering names for people. The patient has had some minor problems with directions with driving. She believes that the memory issues are getting worse over time. She comes in this office for an evaluation.  Past Medical History  Diagnosis Date  . Hypertension   . Thyroid disease   . Gastro-esophageal reflux   . Hyperlipidemia   . RLS (restless legs syndrome)   . Depression   . Vitamin B 12 deficiency   . 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 Memory disorder 10/30/2015  . 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 Abdominal hysterectomy    . Tonsillectomy    . Appendectomy    . Thyroidectomy    . Cholecystectomy    . Cataract extraction    . Joint replacement      2 total knee replacement  . Lasik      Family History  Problem Relation Age of Onset  . Cancer Mother   . Hypertension Mother   . Cancer Father   . Diabetes Sister   . Heart disease Brother   . Diabetes Brother   . Diabetes Brother     Social history:  reports that she quit smoking about 28 years ago. She does not have any smokeless tobacco history on file. She reports that she does not drink alcohol or use illicit drugs.  Medications:  Prior to Admission medications   Medication Sig Start Date End Date Taking? Authorizing Provider  aspirin 81 MG tablet Take 324 mg by mouth once.   Yes Historical Provider, MD  clonazePAM (KLONOPIN) 1 MG tablet Take 1 mg by mouth at bedtime as needed. Restless legs. 10/10/13  Yes Historical Provider, MD  losartan (COZAAR) 100 MG tablet Take 100 mg by mouth daily.   Yes Historical Provider, MD  omeprazole (PRILOSEC) 40 MG capsule Take 40 mg by mouth at bedtime. 08/24/15  Yes Historical Provider, MD  ONE TOUCH ULTRA TEST test strip CHECK FASTING BLOOD SUGAR QAM AND EVENING AS  DIRECTED 10/18/15  Yes Historical Provider, MD  levothyroxine (SYNTHROID) 50 MCG tablet Take 50 mcg by mouth. 03/21/13 03/21/14  Historical Provider, MD  omeprazole (PRILOSEC) 40 MG capsule Take 40 mg by mouth. 03/21/13 03/21/14  Historical Provider, MD     No Known Allergies  ROS:  Out of a complete 14 system review of symptoms, the patient complains only of the following symptoms, and all other reviewed systems are negative.  Blurred vision, dry eyes Memory loss, numbness Restless legs  Blood pressure 118/66, pulse 76, height 5\' 1"  (1.549 m), weight 281 lb (127.461 kg).  Physical Exam  General: The patient is alert and cooperative at the time of the examination.  The patient is markedly obese.  Eyes: Pupils are equal, round, and reactive to light. Discs are flat bilaterally.  Neck: The neck is supple, no carotid bruits are noted.  Respiratory: The respiratory examination is clear.  Cardiovascular: The cardiovascular examination reveals a regular rate and rhythm, no obvious murmurs or rubs are noted.  Neuromuscular: Range of movement of the cervical spine is full. No crepitus is noted in the temporal mandibular joints.  Skin: Extremities are without significant edema.  Neurologic Exam  Mental status: The patient is alert and oriented x 3 at the time of the examination. The patient has apparent normal recent and remote memory, with an apparently normal attention span and concentration ability. Mini-Mental Status Examination done today shows a total score of 29/30.  Cranial nerves: Facial symmetry is present. There is good sensation of the face to pinprick and soft touch bilaterally. The strength of the facial muscles and the muscles to head turning and shoulder shrug are normal bilaterally. Speech is well enunciated, no aphasia or dysarthria is noted. Extraocular movements are full. Visual fields are full. The tongue is midline, and the patient has symmetric elevation of the soft palate. No obvious hearing deficits are noted.  Motor: The motor testing reveals 5 over 5 strength of all 4 extremities. Good symmetric motor tone is noted throughout.  Sensory: Sensory testing is intact to pinprick, soft touch, vibration sensation, and position sense on all 4 extremities. No evidence of extinction is noted.  Coordination: Cerebellar testing reveals good finger-nose-finger and heel-to-shin bilaterally. The patient has an intention tremor with finger-nose-finger bilaterally, more prominent with the left hand.  Gait and station: Gait is normal. Tandem gait is normal. Romberg is negative. No drift is seen.  Reflexes: Deep tendon reflexes are symmetric and  normal bilaterally. Toes are downgoing bilaterally.   Assessment/Plan:  1. Atypical facial pain, left frontal  2. Reported memory disturbance  3. Anosmia  4. Physiologic tremor  The patient has had onset of some neuralgia type pain that occurs in the left frontal area. The patient also has some numbness and sensory alteration in the upper face on the left. The patient will be sent for MRI of the brain to evaluate this and to evaluate the memory issues and onset of anosmia. The patient will have contrast with the study. She will have blood work done today. Follow-up in about 6 months.  Jill Alexanders MD 10/30/2015 6:42 PM  [facility] [address] [place], [place] [address]  Phone [phone] Fax [phone]

## 2015-10-31 ENCOUNTER — Telehealth: Payer: Self-pay

## 2015-10-31 LAB — SEDIMENTATION RATE: Sed Rate: 2 mm/hr (ref 0–40)

## 2015-10-31 LAB — VITAMIN B12: VITAMIN B 12: 349 pg/mL (ref 211–946)

## 2015-10-31 LAB — HIV ANTIBODY (ROUTINE TESTING W REFLEX): HIV SCREEN 4TH GENERATION: NONREACTIVE

## 2015-10-31 LAB — RPR: RPR Ser Ql: NONREACTIVE

## 2015-10-31 NOTE — Telephone Encounter (Signed)
Called pt w/ unremarkable lab results. Verbalized understanding and appreciation for call. 

## 2015-10-31 NOTE — Telephone Encounter (Signed)
-----   Message from Kathrynn Ducking, MD sent at 10/31/2015  8:03 AM EDT -----  The blood work results are unremarkable. Please call the patient.  ----- Message -----    From: [facility] Lab Results In Interface    Sent: 10/31/2015   7:45 AM      To: Kathrynn Ducking, MD

## 2015-11-07 ENCOUNTER — Ambulatory Visit
Admission: RE | Admit: 2015-11-07 | Discharge: 2015-11-07 | Disposition: A | Discharge status: Home / Self Care | Payer: PPO | Source: Ambulatory Visit | Attending: Neurology | Admitting: Neurology

## 2015-11-07 DIAGNOSIS — G501 Atypical facial pain: Secondary | ICD-10-CM

## 2015-11-07 MED ORDER — GADOBENATE DIMEGLUMINE 529 MG/ML IV SOLN: 20.0000 mL | Freq: Once | INTRAVENOUS | Status: DC | PRN

## 2015-11-09 ENCOUNTER — Telehealth: Payer: Self-pay | Admitting: Neurology

## 2015-11-09 NOTE — Telephone Encounter (Signed)
I called patient. MRI the brain shows very minimal white matter changes, sinuses appear to be clear, no etiology for the neuralgia type pain, memory disturbance, or the anosmia. We will follow-up in 6 months, follow the memory issues. If the pain problem becomes more severe, we can initiate treatment at any time.   MRI brain 11/08/15:  IMPRESSION:  Mildly abnormal MRI brain (with and without) demonstrating: 1. Few punctate foci of periventricular and subcortical non-specific gliosis. No abnormal lesions on post-contrast views.  2. Mild diffuse and moderate perisylvian atrophy. 3. No acute findings.

## 2015-12-19 DIAGNOSIS — D485 Neoplasm of uncertain behavior of skin: Secondary | ICD-10-CM | POA: Diagnosis not present

## 2015-12-19 DIAGNOSIS — L820 Inflamed seborrheic keratosis: Secondary | ICD-10-CM | POA: Diagnosis not present

## 2015-12-19 DIAGNOSIS — D235 Other benign neoplasm of skin of trunk: Secondary | ICD-10-CM | POA: Diagnosis not present

## 2015-12-19 DIAGNOSIS — D225 Melanocytic nevi of trunk: Secondary | ICD-10-CM | POA: Diagnosis not present

## 2015-12-19 DIAGNOSIS — L821 Other seborrheic keratosis: Secondary | ICD-10-CM | POA: Diagnosis not present

## 2015-12-19 DIAGNOSIS — C44311 Basal cell carcinoma of skin of nose: Secondary | ICD-10-CM | POA: Diagnosis not present

## 2015-12-26 DIAGNOSIS — M47816 Spondylosis without myelopathy or radiculopathy, lumbar region: Secondary | ICD-10-CM | POA: Diagnosis not present

## 2016-02-07 DIAGNOSIS — I10 Essential (primary) hypertension: Secondary | ICD-10-CM | POA: Diagnosis not present

## 2016-02-07 DIAGNOSIS — M778 Other enthesopathies, not elsewhere classified: Secondary | ICD-10-CM | POA: Diagnosis not present

## 2016-02-07 DIAGNOSIS — E119 Type 2 diabetes mellitus without complications: Secondary | ICD-10-CM | POA: Diagnosis not present

## 2016-02-07 DIAGNOSIS — E669 Obesity, unspecified: Secondary | ICD-10-CM | POA: Diagnosis not present

## 2016-02-07 DIAGNOSIS — G2581 Restless legs syndrome: Secondary | ICD-10-CM | POA: Diagnosis not present

## 2016-03-06 DIAGNOSIS — M4806 Spinal stenosis, lumbar region: Secondary | ICD-10-CM | POA: Diagnosis not present

## 2016-03-06 DIAGNOSIS — M47816 Spondylosis without myelopathy or radiculopathy, lumbar region: Secondary | ICD-10-CM | POA: Diagnosis not present

## 2016-03-06 DIAGNOSIS — M542 Cervicalgia: Secondary | ICD-10-CM | POA: Diagnosis not present

## 2016-03-06 DIAGNOSIS — M546 Pain in thoracic spine: Secondary | ICD-10-CM | POA: Diagnosis not present

## 2016-03-11 DIAGNOSIS — C44311 Basal cell carcinoma of skin of nose: Secondary | ICD-10-CM | POA: Diagnosis not present

## 2016-03-21 ENCOUNTER — Ambulatory Visit (INDEPENDENT_AMBULATORY_CARE_PROVIDER_SITE_OTHER): Payer: PPO | Admitting: Interventional Cardiology

## 2016-03-21 ENCOUNTER — Encounter: Payer: Self-pay | Admitting: Interventional Cardiology

## 2016-03-21 VITALS — BP 143/83 | HR 84 | Ht 62.0 in | Wt 288.0 lb

## 2016-03-21 DIAGNOSIS — R9431 Abnormal electrocardiogram [ECG] [EKG]: Secondary | ICD-10-CM

## 2016-03-21 DIAGNOSIS — E785 Hyperlipidemia, unspecified: Secondary | ICD-10-CM | POA: Diagnosis not present

## 2016-03-21 DIAGNOSIS — I480 Paroxysmal atrial fibrillation: Secondary | ICD-10-CM

## 2016-03-21 DIAGNOSIS — I10 Essential (primary) hypertension: Secondary | ICD-10-CM | POA: Diagnosis not present

## 2016-03-21 LAB — TSH: TSH: 0.97 mIU/L

## 2016-03-21 NOTE — Addendum Note (Signed)
Addended by: Lamar Laundry on: 03/21/2016 04:19 PM   Modules accepted: Orders

## 2016-03-21 NOTE — Patient Instructions (Signed)
Medication Instructions:  Your physician recommends that you continue on your current medications as directed. Please refer to the Current Medication list given to you today.   Labwork: None ordered  Testing/Procedures: Your physician has requested that you have an echocardiogram. Echocardiography is a painless test that uses sound waves to create images of your heart. It provides your doctor with information about the size and shape of your heart and how well your heart's chambers and valves are working. This procedure takes approximately one hour. There are no restrictions for this procedure.  Your physician has recommended that you wear a holter monitor. Holter monitors are medical devices that record the heart's electrical activity. Doctors most often use these monitors to diagnose arrhythmias. Arrhythmias are problems with the speed or rhythm of the heartbeat. The monitor is a small, portable device. You can wear one while you do your normal daily activities. This is usually used to diagnose what is causing palpitations/syncope (passing out).    Follow-Up: Your physician recommends that you schedule a follow-up appointment in: 4-6 weeks    Any Other Special Instructions Will Be Listed Below (If Applicable).     If you need a refill on your cardiac medications before your next appointment, please call your pharmacy.

## 2016-03-21 NOTE — Progress Notes (Signed)
Cardiology Office Note    Date:  03/21/2016   ID:  [name], DOB 12/21/1950, MRN [id number]  PCP:  Daphene Calamity, MD  Cardiologist: Sinclair Grooms, MD   Chief Complaint  Patient presents with  . Palpitations    History of Present Illness:  [name] is a 65 y.o. female several year history of brief palpitations now occurring nearly daily lasting up to 10 minutes with very rapid heartbeat, pulsations in the neck, anxiety provocation, and feeling of dyspnea.  The patient has a prior history of heart disease. There is a family history of atrial fibrillation. Her sister has been recently diagnosed and is on therapy. Her brother has atrial fibrillation as did her mother. Over the past 5-10 years she has had very brief episodes of rapid pulsations in her neck and chest. These episodes will usually last less than 10-30 seconds. Over the past 3 months, particularly when she tries to lie down at night, she will develop tachycardia that she can feel in her neck that can last up to 10-15 minutes. No chest pain. Some mild dyspnea and apprehension.  She also knows that when EKGs are performed that her primary care doctor in particular gives concerned. On one occasion during an episode of bronchitis, an EKG was performed and she was sent to the emergency room for evaluation because of "abnormal appearance".    Past Medical History:  Diagnosis Date  . 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 Depression   . Gastro-esophageal reflux   . Hyperlipidemia   . Hypertension   . Memory disorder 10/30/2015  . RLS (restless legs syndrome)   . Thyroid disease   . Vitamin B 12 deficiency     Past Surgical History:  Procedure Laterality Date  . ABDOMINAL HYSTERECTOMY    . APPENDECTOMY    . CATARACT EXTRACTION    . CHOLECYSTECTOMY    . JOINT REPLACEMENT     2 total knee replacement  . LASIK    . THYROIDECTOMY    . TONSILLECTOMY       Current Medications: Outpatient Medications Prior to Visit  Medication Sig Dispense Refill  . clonazePAM (KLONOPIN) 1 MG tablet Take 1 mg by mouth at bedtime as needed. Restless legs.    Marland Kitchen losartan (COZAAR) 100 MG tablet Take 100 mg by mouth daily.    Marland Kitchen omeprazole (PRILOSEC) 40 MG capsule Take 40 mg by mouth daily.   12  . ALPRAZolam (XANAX) 0.5 MG tablet Take 2 tablets approximately 45 minutes prior to the MRI study, take a third tablet if needed. (Patient not taking: Reported on 03/21/2016) 3 tablet 0  . aspirin 81 MG tablet Take 324 mg by mouth once.    Marland Kitchen levothyroxine (SYNTHROID) 50 MCG tablet Take 50 mcg by mouth.    Marland Kitchen omeprazole (PRILOSEC) 40 MG capsule Take 40 mg by mouth.    . ONE TOUCH ULTRA TEST test strip CHECK FASTING BLOOD SUGAR QAM AND EVENING AS DIRECTED  11   No facility-administered medications prior to visit.      Allergies:   Review of patient's allergies indicates no known allergies.   Social History   Social History  . Marital status: Divorced    Spouse name: N/A  . Number of children: N/A  . Years of education: N/A   Social History Main Topics  . Smoking status: Former Smoker    Quit date: 02/17/1987  . Smokeless  tobacco: Never Used  . Alcohol use No  . Drug use: No  . Sexual activity: Not Asked   Other Topics Concern  . None   Social History Narrative   Lives at home alone   Right-handed        Family History:  The patient's family history includes Cancer in her father and mother; Diabetes in her brother, brother, and sister; Heart disease in her brother; Hypertension in her mother.   ROS:   Please see the history of present illness.    Shortness of breath, morbid obesity, insomnia, marked increase in caffeine intake,  All other systems reviewed and are negative.   PHYSICAL EXAM:   VS:  BP (!) 143/83   Pulse 84   Ht 5\' 2"  (1.575 m)   Wt 288 lb (130.6 kg)   BMI 52.68 kg/m    GEN: Well nourished, well developed, in no acute distress   HEENT: normal  Neck: no JVD, carotid bruits, or masses Cardiac: RRR; no murmurs, rubs, or gallops,no edema  Respiratory:  clear to auscultation bilaterally, normal work of breathing GI: soft, nontender, nondistended, + BS MS: no deformity or atrophy  Skin: warm and dry, no rash Neuro:  Alert and Oriented x 3, Strength and sensation are intact Psych: euthymic mood, full affect  Wt Readings from Last 3 Encounters:  03/21/16 288 lb (130.6 kg)  10/30/15 281 lb (127.5 kg)  09/20/15 276 lb (125.2 kg)      Studies/Labs Reviewed:   EKG:  EKG  Normal sinus rhythm T wave inversion V1 through V3. Small possibly significant inferior Q waves. When compared to tracing from March, no significant change has occurred  Recent Labs: 09/20/2015: ALT 34; BUN 11; Creatinine, Ser 0.66; Hemoglobin 14.1; Platelets 222; Potassium 4.1; Sodium 143   Lipid Panel No results found for: CHOL, TRIG, HDL, CHOLHDL, VLDL, LDLCALC, LDLDIRECT  Additional studies/ records that were reviewed today include:  EKG from March 2017 reveals anterior T-wave inversion V1 and V2    ASSESSMENT:    1. Paroxysmal atrial fibrillation (HCC)   2. Essential hypertension   3. Hyperlipidemia      PLAN:  In order of problems listed above:  1. This is a suspected diagnosis. She needs a 2-D Doppler echocardiogram, 48 hour Holter monitor, and TSH. Clinical follow-up in 4-6 weeks. May need a 30 day continuous ambulatory monitor if we are unable to detect the arrhythmia. She would like to avoid a 30 day study 2. Adequate control on her current regimen of losartan 100 mg per day.. 3. Not addressed 4. If A. fib documented, sleep apnea will need to be considered. 5. Echocardiogram needs to be performed in the setting of the mildly abnormal EKG to exclude the possibility of pulmonary hypertension and structural abnormality that may serve as a substrate for arrhythmia.    Medication Adjustments/Labs and Tests Ordered: Current  medicines are reviewed at length with the patient today.  Concerns regarding medicines are outlined above.  Medication changes, Labs and Tests ordered today are listed in the Patient Instructions below. Patient Instructions  Medication Instructions:  Your physician recommends that you continue on your current medications as directed. Please refer to the Current Medication list given to you today.   Labwork: None ordered  Testing/Procedures: Your physician has requested that you have an echocardiogram. Echocardiography is a painless test that uses sound waves to create images of your heart. It provides your doctor with information about the size and shape of your  heart and how well your heart's chambers and valves are working. This procedure takes approximately one hour. There are no restrictions for this procedure.  Your physician has recommended that you wear a holter monitor. Holter monitors are medical devices that record the heart's electrical activity. Doctors most often use these monitors to diagnose arrhythmias. Arrhythmias are problems with the speed or rhythm of the heartbeat. The monitor is a small, portable device. You can wear one while you do your normal daily activities. This is usually used to diagnose what is causing palpitations/syncope (passing out).    Follow-Up: Your physician recommends that you schedule a follow-up appointment in: 4-6 weeks    Any Other Special Instructions Will Be Listed Below (If Applicable).     If you need a refill on your cardiac medications before your next appointment, please call your pharmacy.      Signed, Sinclair Grooms, MD  03/21/2016 4:13 PM    [facility] [address], [place], [place]  [address] Phone: [phone]; Fax: [phone]

## 2016-04-03 ENCOUNTER — Other Ambulatory Visit (HOSPITAL_COMMUNITY): Payer: PPO

## 2016-04-09 ENCOUNTER — Telehealth: Payer: Self-pay | Admitting: Interventional Cardiology

## 2016-04-09 NOTE — Telephone Encounter (Signed)
I called spoke with patient she advised she did cancel the monitor appt and the Echo appt and as of right now she is not going to have the test performed. She states she has not had any more flutter symptoms and will call us back if she does to get the test done. I have removed the orders from the workque.   [name]

## 2016-04-11 ENCOUNTER — Other Ambulatory Visit (HOSPITAL_COMMUNITY): Payer: PPO

## 2016-04-25 ENCOUNTER — Ambulatory Visit: Payer: PPO | Admitting: Cardiology

## 2016-05-09 ENCOUNTER — Ambulatory Visit: Payer: PPO | Admitting: Neurology

## 2016-06-02 DIAGNOSIS — B351 Tinea unguium: Secondary | ICD-10-CM | POA: Diagnosis not present

## 2016-06-04 DIAGNOSIS — M47816 Spondylosis without myelopathy or radiculopathy, lumbar region: Secondary | ICD-10-CM | POA: Diagnosis not present

## 2016-07-21 DIAGNOSIS — J40 Bronchitis, not specified as acute or chronic: Secondary | ICD-10-CM | POA: Diagnosis not present

## 2016-07-21 DIAGNOSIS — J0100 Acute maxillary sinusitis, unspecified: Secondary | ICD-10-CM | POA: Diagnosis not present

## 2016-08-15 DIAGNOSIS — M5136 Other intervertebral disc degeneration, lumbar region: Secondary | ICD-10-CM | POA: Diagnosis not present

## 2016-08-15 DIAGNOSIS — G894 Chronic pain syndrome: Secondary | ICD-10-CM | POA: Diagnosis not present

## 2016-08-15 DIAGNOSIS — M47816 Spondylosis without myelopathy or radiculopathy, lumbar region: Secondary | ICD-10-CM | POA: Diagnosis not present

## 2016-08-19 DIAGNOSIS — Z0000 Encounter for general adult medical examination without abnormal findings: Secondary | ICD-10-CM | POA: Diagnosis not present

## 2016-09-06 IMAGING — CR DG CHEST 2V
2 series · 2 of 2 positions shown · non-contrast
Comparison: October 22, 2013.

CLINICAL DATA: Cough, shortness of breath.

EXAM:
CHEST  2 VIEW

[w chest pa]
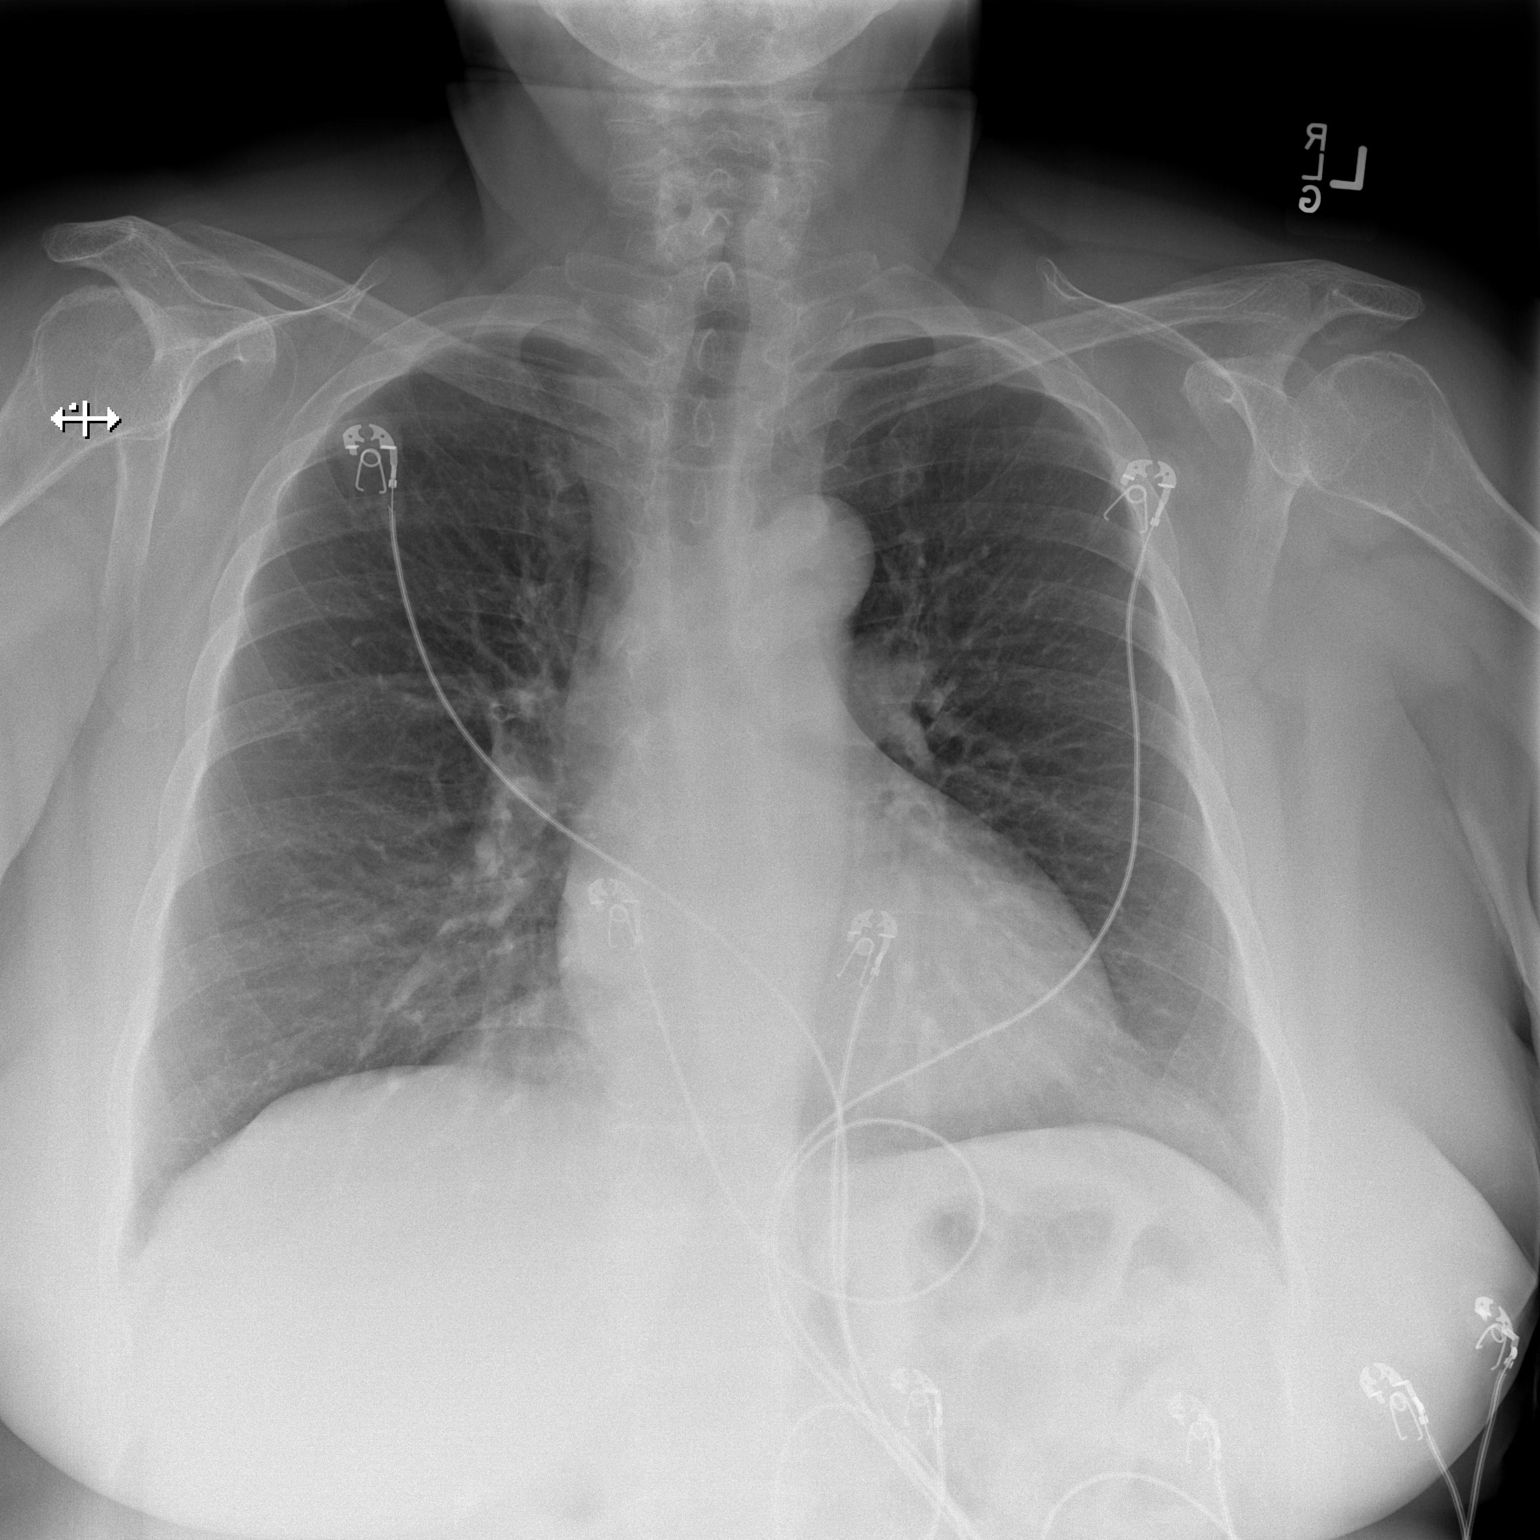

[w chest lat]
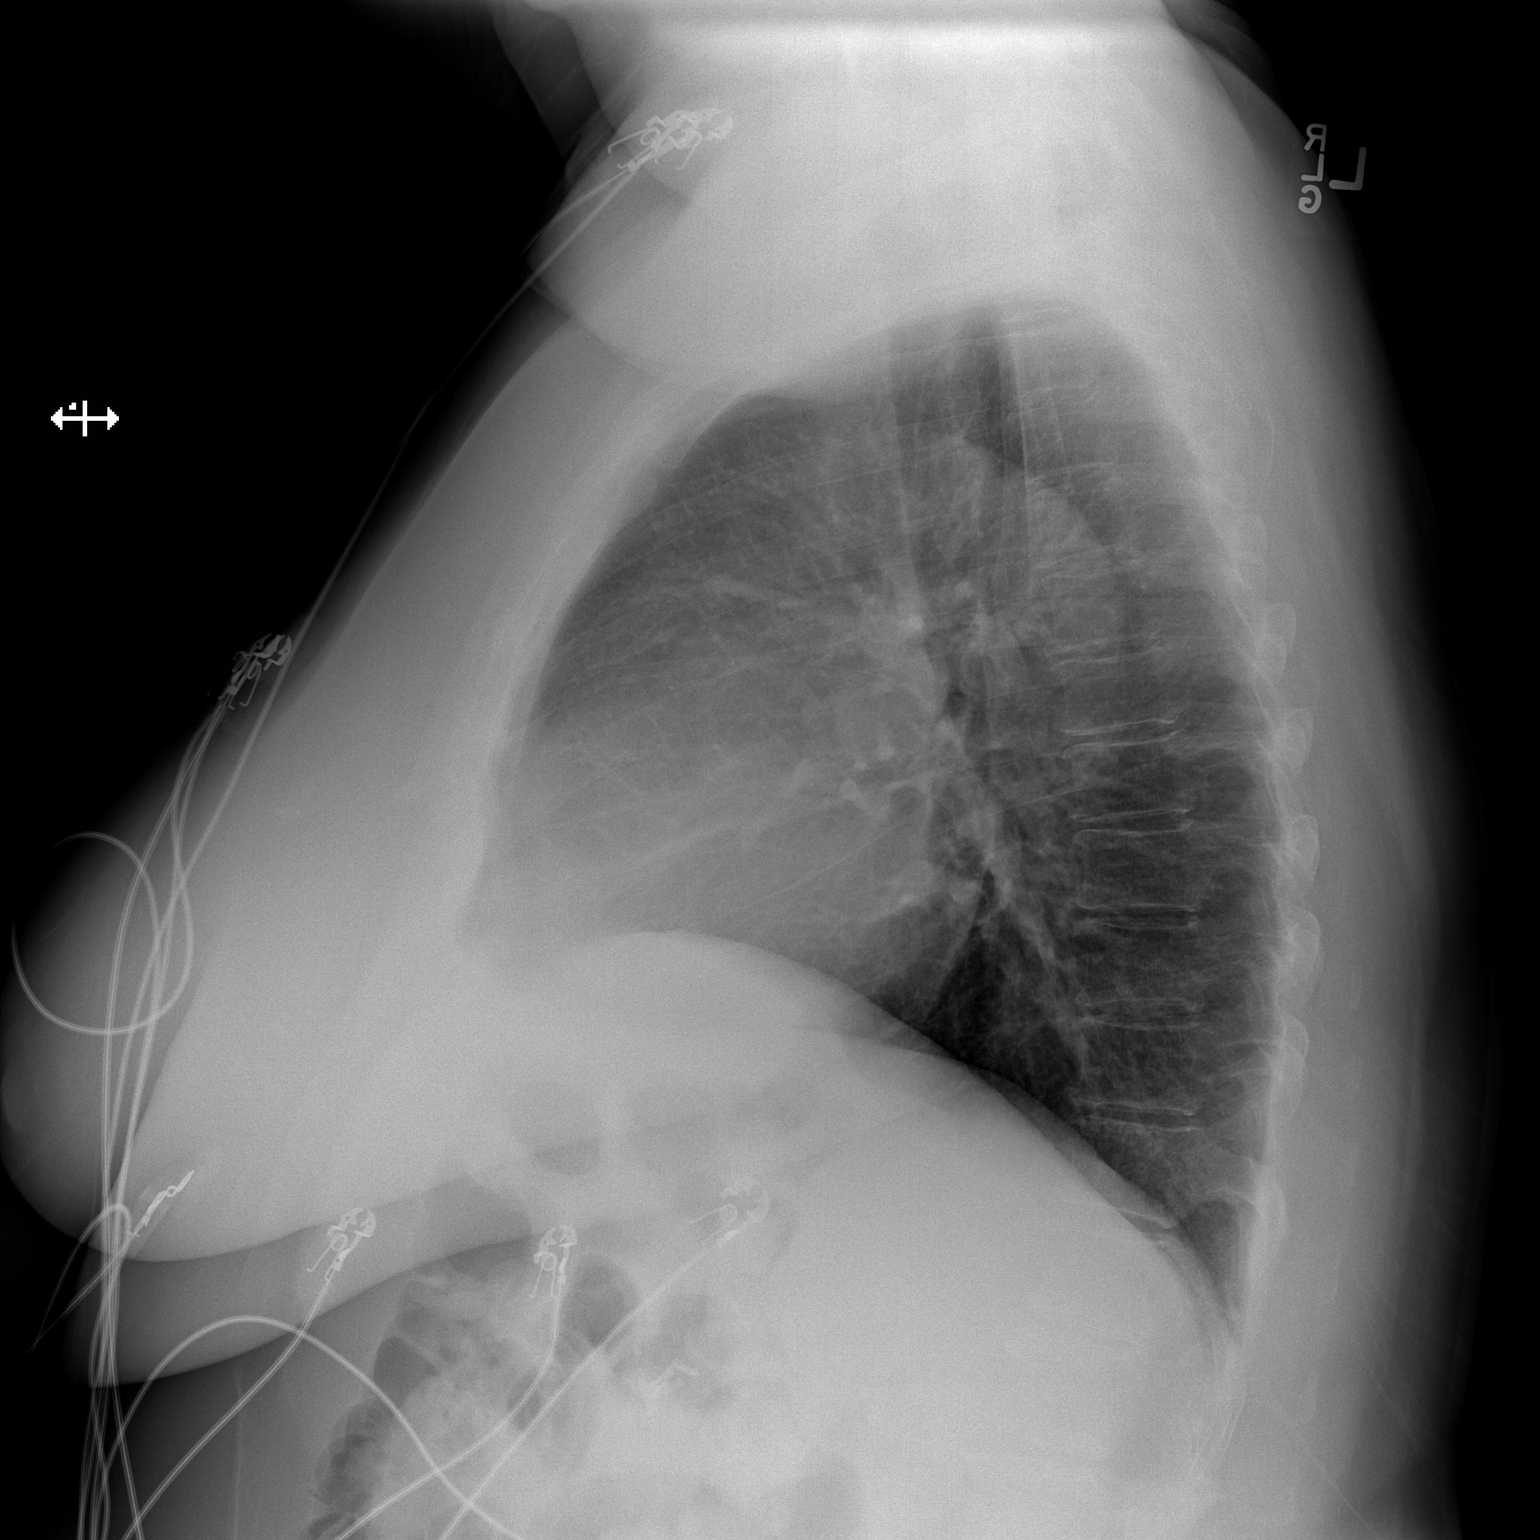

[2 of 2 positions shown; findings below may reference images not displayed]

FINDINGS: The heart size and mediastinal contours are within normal limits.
Both lungs are clear. No pneumothorax or pleural effusion is noted.
The visualized skeletal structures are unremarkable.
IMPRESSION: No active cardiopulmonary disease.

## 2016-10-08 DIAGNOSIS — M47816 Spondylosis without myelopathy or radiculopathy, lumbar region: Secondary | ICD-10-CM | POA: Diagnosis not present

## 2016-10-24 DIAGNOSIS — M5136 Other intervertebral disc degeneration, lumbar region: Secondary | ICD-10-CM | POA: Diagnosis not present

## 2016-10-24 DIAGNOSIS — M47816 Spondylosis without myelopathy or radiculopathy, lumbar region: Secondary | ICD-10-CM | POA: Diagnosis not present

## 2016-10-24 DIAGNOSIS — G894 Chronic pain syndrome: Secondary | ICD-10-CM | POA: Diagnosis not present

## 2016-12-16 DIAGNOSIS — Z85828 Personal history of other malignant neoplasm of skin: Secondary | ICD-10-CM | POA: Diagnosis not present

## 2016-12-16 DIAGNOSIS — B351 Tinea unguium: Secondary | ICD-10-CM | POA: Diagnosis not present

## 2016-12-16 DIAGNOSIS — L814 Other melanin hyperpigmentation: Secondary | ICD-10-CM | POA: Diagnosis not present

## 2016-12-16 DIAGNOSIS — D1801 Hemangioma of skin and subcutaneous tissue: Secondary | ICD-10-CM | POA: Diagnosis not present

## 2016-12-16 DIAGNOSIS — L821 Other seborrheic keratosis: Secondary | ICD-10-CM | POA: Diagnosis not present

## 2016-12-26 DIAGNOSIS — Z23 Encounter for immunization: Secondary | ICD-10-CM | POA: Diagnosis not present

## 2016-12-26 DIAGNOSIS — E039 Hypothyroidism, unspecified: Secondary | ICD-10-CM | POA: Diagnosis not present

## 2016-12-26 DIAGNOSIS — I10 Essential (primary) hypertension: Secondary | ICD-10-CM | POA: Diagnosis not present

## 2016-12-26 DIAGNOSIS — Z0000 Encounter for general adult medical examination without abnormal findings: Secondary | ICD-10-CM | POA: Diagnosis not present

## 2016-12-26 DIAGNOSIS — E559 Vitamin D deficiency, unspecified: Secondary | ICD-10-CM | POA: Diagnosis not present

## 2016-12-26 DIAGNOSIS — G2581 Restless legs syndrome: Secondary | ICD-10-CM | POA: Diagnosis not present

## 2016-12-26 DIAGNOSIS — E118 Type 2 diabetes mellitus with unspecified complications: Secondary | ICD-10-CM | POA: Diagnosis not present

## 2016-12-26 DIAGNOSIS — M810 Age-related osteoporosis without current pathological fracture: Secondary | ICD-10-CM | POA: Diagnosis not present

## 2017-01-27 DIAGNOSIS — E118 Type 2 diabetes mellitus with unspecified complications: Secondary | ICD-10-CM | POA: Diagnosis not present

## 2017-01-27 DIAGNOSIS — N390 Urinary tract infection, site not specified: Secondary | ICD-10-CM | POA: Diagnosis not present

## 2017-01-27 DIAGNOSIS — E559 Vitamin D deficiency, unspecified: Secondary | ICD-10-CM | POA: Diagnosis not present

## 2017-01-27 DIAGNOSIS — M810 Age-related osteoporosis without current pathological fracture: Secondary | ICD-10-CM | POA: Diagnosis not present

## 2017-01-27 DIAGNOSIS — E039 Hypothyroidism, unspecified: Secondary | ICD-10-CM | POA: Diagnosis not present

## 2017-01-27 DIAGNOSIS — I10 Essential (primary) hypertension: Secondary | ICD-10-CM | POA: Diagnosis not present

## 2017-02-04 DIAGNOSIS — K219 Gastro-esophageal reflux disease without esophagitis: Secondary | ICD-10-CM | POA: Diagnosis not present

## 2017-02-04 DIAGNOSIS — Z8679 Personal history of other diseases of the circulatory system: Secondary | ICD-10-CM | POA: Diagnosis not present

## 2017-02-04 DIAGNOSIS — Z0000 Encounter for general adult medical examination without abnormal findings: Secondary | ICD-10-CM | POA: Diagnosis not present

## 2017-02-04 DIAGNOSIS — E669 Obesity, unspecified: Secondary | ICD-10-CM | POA: Diagnosis not present

## 2017-02-04 DIAGNOSIS — E119 Type 2 diabetes mellitus without complications: Secondary | ICD-10-CM | POA: Diagnosis not present

## 2017-02-04 DIAGNOSIS — I10 Essential (primary) hypertension: Secondary | ICD-10-CM | POA: Diagnosis not present

## 2017-02-04 DIAGNOSIS — E559 Vitamin D deficiency, unspecified: Secondary | ICD-10-CM | POA: Diagnosis not present

## 2017-03-05 ENCOUNTER — Other Ambulatory Visit: Payer: Self-pay | Admitting: Internal Medicine

## 2017-03-05 DIAGNOSIS — Z1231 Encounter for screening mammogram for malignant neoplasm of breast: Secondary | ICD-10-CM

## 2017-03-13 ENCOUNTER — Ambulatory Visit
Admission: RE | Admit: 2017-03-13 | Discharge: 2017-03-13 | Disposition: A | Discharge status: Home / Self Care | Payer: PPO | Source: Ambulatory Visit | Attending: Internal Medicine | Admitting: Internal Medicine

## 2017-03-13 DIAGNOSIS — Z1231 Encounter for screening mammogram for malignant neoplasm of breast: Secondary | ICD-10-CM

## 2017-03-19 DIAGNOSIS — M79672 Pain in left foot: Secondary | ICD-10-CM | POA: Diagnosis not present

## 2017-03-27 DIAGNOSIS — Z23 Encounter for immunization: Secondary | ICD-10-CM | POA: Diagnosis not present

## 2017-03-27 DIAGNOSIS — M810 Age-related osteoporosis without current pathological fracture: Secondary | ICD-10-CM | POA: Diagnosis not present

## 2017-03-31 DIAGNOSIS — L309 Dermatitis, unspecified: Secondary | ICD-10-CM | POA: Diagnosis not present

## 2017-04-16 DIAGNOSIS — M47816 Spondylosis without myelopathy or radiculopathy, lumbar region: Secondary | ICD-10-CM | POA: Diagnosis not present

## 2017-06-13 DIAGNOSIS — J040 Acute laryngitis: Secondary | ICD-10-CM | POA: Diagnosis not present

## 2017-06-13 DIAGNOSIS — J069 Acute upper respiratory infection, unspecified: Secondary | ICD-10-CM | POA: Diagnosis not present

## 2017-07-09 DIAGNOSIS — Z79891 Long term (current) use of opiate analgesic: Secondary | ICD-10-CM | POA: Insufficient documentation

## 2017-07-09 DIAGNOSIS — G894 Chronic pain syndrome: Secondary | ICD-10-CM | POA: Insufficient documentation

## 2017-07-09 DIAGNOSIS — M47816 Spondylosis without myelopathy or radiculopathy, lumbar region: Secondary | ICD-10-CM

## 2017-07-09 HISTORY — DX: Chronic pain syndrome: G89.4

## 2017-07-09 HISTORY — DX: Spondylosis without myelopathy or radiculopathy, lumbar region: M47.816

## 2017-07-23 DIAGNOSIS — M47816 Spondylosis without myelopathy or radiculopathy, lumbar region: Secondary | ICD-10-CM | POA: Diagnosis not present

## 2017-07-31 DIAGNOSIS — E119 Type 2 diabetes mellitus without complications: Secondary | ICD-10-CM | POA: Diagnosis not present

## 2017-07-31 DIAGNOSIS — E559 Vitamin D deficiency, unspecified: Secondary | ICD-10-CM | POA: Diagnosis not present

## 2017-08-12 DIAGNOSIS — E559 Vitamin D deficiency, unspecified: Secondary | ICD-10-CM | POA: Diagnosis not present

## 2017-08-12 DIAGNOSIS — M810 Age-related osteoporosis without current pathological fracture: Secondary | ICD-10-CM | POA: Diagnosis not present

## 2017-08-12 DIAGNOSIS — E118 Type 2 diabetes mellitus with unspecified complications: Secondary | ICD-10-CM | POA: Diagnosis not present

## 2017-08-12 DIAGNOSIS — I10 Essential (primary) hypertension: Secondary | ICD-10-CM | POA: Diagnosis not present

## 2017-08-12 DIAGNOSIS — E039 Hypothyroidism, unspecified: Secondary | ICD-10-CM | POA: Diagnosis not present

## 2017-09-29 DIAGNOSIS — H5203 Hypermetropia, bilateral: Secondary | ICD-10-CM | POA: Diagnosis not present

## 2017-10-23 DIAGNOSIS — H16223 Keratoconjunctivitis sicca, not specified as Sjogren's, bilateral: Secondary | ICD-10-CM | POA: Diagnosis not present

## 2017-11-13 ENCOUNTER — Ambulatory Visit (INDEPENDENT_AMBULATORY_CARE_PROVIDER_SITE_OTHER): Payer: PPO

## 2017-11-13 ENCOUNTER — Ambulatory Visit: Payer: PPO | Admitting: Podiatry

## 2017-11-13 ENCOUNTER — Encounter: Payer: Self-pay | Admitting: Podiatry

## 2017-11-13 VITALS — BP 142/81 | HR 74 | Resp 16

## 2017-11-13 DIAGNOSIS — B351 Tinea unguium: Secondary | ICD-10-CM | POA: Diagnosis not present

## 2017-11-13 DIAGNOSIS — M779 Enthesopathy, unspecified: Secondary | ICD-10-CM

## 2017-11-13 DIAGNOSIS — M7750 Other enthesopathy of unspecified foot: Secondary | ICD-10-CM

## 2017-11-13 MED ORDER — TRIAMCINOLONE ACETONIDE 10 MG/ML IJ SUSP
10.0000 mg | Freq: Once | INTRAMUSCULAR | Status: AC
  Administered 2017-11-13: 10 mg

## 2017-11-16 NOTE — Progress Notes (Signed)
Subjective:   Patient ID: [name], female   DOB: 67 y.o.   MRN: [id number]   HPI Patient presents with a lot of discomfort in the outside of the left ankle without specific injury and states at times is been swelling and making it hard to walk comfortably.  Patient also complains of nail disease bilateral and was concerned about discoloration and patient does not smoke and likes to be active   Review of Systems  All other systems reviewed and are negative.       Objective:  Physical Exam  Constitutional: She appears well-developed and well-nourished.  Cardiovascular: Intact distal pulses.  Pulmonary/Chest: Effort normal.  Musculoskeletal: Normal range of motion.  Neurological: She is alert.  Skin: Skin is warm.  Nursing note and vitals reviewed.   Neurovascular status intact muscle strength is adequate range of motion within normal limits with mild splinting on the left side due to mild discomfort with discomfort mostly centered in the sinus tarsi and slightly into the lateral ankle gutter left with mild swelling and discoloration of the hallux nail bilateral.  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  Do not recommend nail care currently but at one point we could consider topical or other treatments if it were to get worse  X-rays indicate there is no indications of diastases injury or pathology from that standpoint

## 2017-12-16 ENCOUNTER — Ambulatory Visit: Payer: PPO | Admitting: Podiatry

## 2017-12-18 ENCOUNTER — Ambulatory Visit: Payer: PPO | Admitting: Podiatry

## 2017-12-28 DIAGNOSIS — S299XXA Unspecified injury of thorax, initial encounter: Secondary | ICD-10-CM | POA: Diagnosis not present

## 2017-12-28 DIAGNOSIS — R0781 Pleurodynia: Secondary | ICD-10-CM | POA: Diagnosis not present

## 2017-12-28 DIAGNOSIS — M810 Age-related osteoporosis without current pathological fracture: Secondary | ICD-10-CM | POA: Diagnosis not present

## 2017-12-28 DIAGNOSIS — I10 Essential (primary) hypertension: Secondary | ICD-10-CM | POA: Diagnosis not present

## 2017-12-28 DIAGNOSIS — E118 Type 2 diabetes mellitus with unspecified complications: Secondary | ICD-10-CM | POA: Diagnosis not present

## 2018-01-14 DIAGNOSIS — R252 Cramp and spasm: Secondary | ICD-10-CM | POA: Diagnosis not present

## 2018-01-15 DIAGNOSIS — H04123 Dry eye syndrome of bilateral lacrimal glands: Secondary | ICD-10-CM | POA: Diagnosis not present

## 2018-01-15 DIAGNOSIS — H26493 Other secondary cataract, bilateral: Secondary | ICD-10-CM | POA: Diagnosis not present

## 2018-02-10 DIAGNOSIS — R748 Abnormal levels of other serum enzymes: Secondary | ICD-10-CM | POA: Diagnosis not present

## 2018-02-10 DIAGNOSIS — E118 Type 2 diabetes mellitus with unspecified complications: Secondary | ICD-10-CM | POA: Diagnosis not present

## 2018-02-10 DIAGNOSIS — M810 Age-related osteoporosis without current pathological fracture: Secondary | ICD-10-CM | POA: Diagnosis not present

## 2018-02-10 DIAGNOSIS — E559 Vitamin D deficiency, unspecified: Secondary | ICD-10-CM | POA: Diagnosis not present

## 2018-02-12 DIAGNOSIS — R252 Cramp and spasm: Secondary | ICD-10-CM | POA: Diagnosis not present

## 2018-02-12 DIAGNOSIS — I10 Essential (primary) hypertension: Secondary | ICD-10-CM | POA: Diagnosis not present

## 2018-02-12 DIAGNOSIS — E119 Type 2 diabetes mellitus without complications: Secondary | ICD-10-CM | POA: Diagnosis not present

## 2018-02-12 DIAGNOSIS — E669 Obesity, unspecified: Secondary | ICD-10-CM | POA: Diagnosis not present

## 2018-02-12 DIAGNOSIS — E559 Vitamin D deficiency, unspecified: Secondary | ICD-10-CM | POA: Diagnosis not present

## 2018-02-12 DIAGNOSIS — E039 Hypothyroidism, unspecified: Secondary | ICD-10-CM | POA: Diagnosis not present

## 2018-02-12 DIAGNOSIS — K219 Gastro-esophageal reflux disease without esophagitis: Secondary | ICD-10-CM | POA: Diagnosis not present

## 2018-02-12 DIAGNOSIS — G2581 Restless legs syndrome: Secondary | ICD-10-CM | POA: Diagnosis not present

## 2018-02-12 DIAGNOSIS — Z0000 Encounter for general adult medical examination without abnormal findings: Secondary | ICD-10-CM | POA: Diagnosis not present

## 2018-02-12 DIAGNOSIS — E118 Type 2 diabetes mellitus with unspecified complications: Secondary | ICD-10-CM | POA: Diagnosis not present

## 2018-02-19 DIAGNOSIS — M2042 Other hammer toe(s) (acquired), left foot: Secondary | ICD-10-CM | POA: Diagnosis not present

## 2018-02-19 DIAGNOSIS — M79672 Pain in left foot: Secondary | ICD-10-CM | POA: Diagnosis not present

## 2018-02-19 DIAGNOSIS — M7742 Metatarsalgia, left foot: Secondary | ICD-10-CM | POA: Diagnosis not present

## 2018-02-19 DIAGNOSIS — L84 Corns and callosities: Secondary | ICD-10-CM | POA: Diagnosis not present

## 2018-02-23 DIAGNOSIS — M47816 Spondylosis without myelopathy or radiculopathy, lumbar region: Secondary | ICD-10-CM | POA: Diagnosis not present

## 2018-06-11 DIAGNOSIS — L659 Nonscarring hair loss, unspecified: Secondary | ICD-10-CM | POA: Diagnosis not present

## 2018-06-11 DIAGNOSIS — R252 Cramp and spasm: Secondary | ICD-10-CM | POA: Diagnosis not present

## 2018-07-20 DIAGNOSIS — M47816 Spondylosis without myelopathy or radiculopathy, lumbar region: Secondary | ICD-10-CM | POA: Diagnosis not present

## 2018-08-06 DIAGNOSIS — R252 Cramp and spasm: Secondary | ICD-10-CM | POA: Diagnosis not present

## 2018-08-06 DIAGNOSIS — E039 Hypothyroidism, unspecified: Secondary | ICD-10-CM | POA: Diagnosis not present

## 2018-08-06 DIAGNOSIS — E118 Type 2 diabetes mellitus with unspecified complications: Secondary | ICD-10-CM | POA: Diagnosis not present

## 2018-08-06 DIAGNOSIS — E559 Vitamin D deficiency, unspecified: Secondary | ICD-10-CM | POA: Diagnosis not present

## 2018-08-06 DIAGNOSIS — I10 Essential (primary) hypertension: Secondary | ICD-10-CM | POA: Diagnosis not present

## 2018-08-20 DIAGNOSIS — E559 Vitamin D deficiency, unspecified: Secondary | ICD-10-CM | POA: Diagnosis not present

## 2018-08-20 DIAGNOSIS — R7989 Other specified abnormal findings of blood chemistry: Secondary | ICD-10-CM | POA: Diagnosis not present

## 2018-08-20 DIAGNOSIS — R21 Rash and other nonspecific skin eruption: Secondary | ICD-10-CM | POA: Diagnosis not present

## 2018-08-20 DIAGNOSIS — E119 Type 2 diabetes mellitus without complications: Secondary | ICD-10-CM | POA: Diagnosis not present

## 2018-08-20 DIAGNOSIS — G2581 Restless legs syndrome: Secondary | ICD-10-CM | POA: Diagnosis not present

## 2018-11-23 DIAGNOSIS — M47817 Spondylosis without myelopathy or radiculopathy, lumbosacral region: Secondary | ICD-10-CM | POA: Diagnosis not present

## 2018-11-23 DIAGNOSIS — M545 Low back pain: Secondary | ICD-10-CM | POA: Diagnosis not present

## 2018-12-01 DIAGNOSIS — M5136 Other intervertebral disc degeneration, lumbar region: Secondary | ICD-10-CM | POA: Diagnosis not present

## 2018-12-01 DIAGNOSIS — Z79891 Long term (current) use of opiate analgesic: Secondary | ICD-10-CM | POA: Diagnosis not present

## 2018-12-01 DIAGNOSIS — G894 Chronic pain syndrome: Secondary | ICD-10-CM | POA: Diagnosis not present

## 2018-12-01 DIAGNOSIS — M47816 Spondylosis without myelopathy or radiculopathy, lumbar region: Secondary | ICD-10-CM | POA: Diagnosis not present

## 2018-12-21 DIAGNOSIS — M5136 Other intervertebral disc degeneration, lumbar region: Secondary | ICD-10-CM | POA: Diagnosis not present

## 2018-12-28 DIAGNOSIS — Z20828 Contact with and (suspected) exposure to other viral communicable diseases: Secondary | ICD-10-CM | POA: Diagnosis not present

## 2019-02-04 DIAGNOSIS — H43813 Vitreous degeneration, bilateral: Secondary | ICD-10-CM | POA: Diagnosis not present

## 2019-02-11 DIAGNOSIS — E559 Vitamin D deficiency, unspecified: Secondary | ICD-10-CM | POA: Diagnosis not present

## 2019-02-11 DIAGNOSIS — R7989 Other specified abnormal findings of blood chemistry: Secondary | ICD-10-CM | POA: Diagnosis not present

## 2019-02-11 DIAGNOSIS — E119 Type 2 diabetes mellitus without complications: Secondary | ICD-10-CM | POA: Diagnosis not present

## 2019-02-18 DIAGNOSIS — G2581 Restless legs syndrome: Secondary | ICD-10-CM | POA: Diagnosis not present

## 2019-02-18 DIAGNOSIS — M810 Age-related osteoporosis without current pathological fracture: Secondary | ICD-10-CM | POA: Diagnosis not present

## 2019-02-18 DIAGNOSIS — E119 Type 2 diabetes mellitus without complications: Secondary | ICD-10-CM | POA: Diagnosis not present

## 2019-02-18 DIAGNOSIS — E559 Vitamin D deficiency, unspecified: Secondary | ICD-10-CM | POA: Diagnosis not present

## 2019-02-18 DIAGNOSIS — E669 Obesity, unspecified: Secondary | ICD-10-CM | POA: Diagnosis not present

## 2019-02-18 DIAGNOSIS — Z6839 Body mass index (BMI) 39.0-39.9, adult: Secondary | ICD-10-CM | POA: Diagnosis not present

## 2019-02-18 DIAGNOSIS — Z0000 Encounter for general adult medical examination without abnormal findings: Secondary | ICD-10-CM | POA: Diagnosis not present

## 2019-02-18 DIAGNOSIS — I10 Essential (primary) hypertension: Secondary | ICD-10-CM | POA: Diagnosis not present

## 2019-02-18 DIAGNOSIS — Z780 Asymptomatic menopausal state: Secondary | ICD-10-CM | POA: Diagnosis not present

## 2019-03-25 DIAGNOSIS — M8589 Other specified disorders of bone density and structure, multiple sites: Secondary | ICD-10-CM | POA: Diagnosis not present

## 2019-03-25 DIAGNOSIS — E119 Type 2 diabetes mellitus without complications: Secondary | ICD-10-CM | POA: Diagnosis not present

## 2019-03-25 DIAGNOSIS — M25511 Pain in right shoulder: Secondary | ICD-10-CM | POA: Diagnosis not present

## 2019-03-25 DIAGNOSIS — M8580 Other specified disorders of bone density and structure, unspecified site: Secondary | ICD-10-CM | POA: Diagnosis not present

## 2019-03-25 DIAGNOSIS — M79645 Pain in left finger(s): Secondary | ICD-10-CM | POA: Diagnosis not present

## 2019-03-25 DIAGNOSIS — Z780 Asymptomatic menopausal state: Secondary | ICD-10-CM | POA: Diagnosis not present

## 2019-03-25 DIAGNOSIS — Z23 Encounter for immunization: Secondary | ICD-10-CM | POA: Diagnosis not present

## 2019-03-25 DIAGNOSIS — I10 Essential (primary) hypertension: Secondary | ICD-10-CM | POA: Diagnosis not present

## 2019-04-13 LAB — HM COLONOSCOPY

## 2019-05-31 DIAGNOSIS — Z79891 Long term (current) use of opiate analgesic: Secondary | ICD-10-CM | POA: Diagnosis not present

## 2019-10-24 DIAGNOSIS — M25511 Pain in right shoulder: Secondary | ICD-10-CM | POA: Diagnosis not present

## 2019-11-07 ENCOUNTER — Other Ambulatory Visit: Payer: Self-pay | Admitting: Orthopedic Surgery

## 2019-11-07 DIAGNOSIS — M25511 Pain in right shoulder: Secondary | ICD-10-CM

## 2019-12-18 ENCOUNTER — Emergency Department (HOSPITAL_COMMUNITY)
Admission: EM | Admit: 2019-12-18 | Discharge: 2019-12-18 | Disposition: A | Discharge status: Home / Self Care | Payer: PPO | Attending: Emergency Medicine | Admitting: Emergency Medicine

## 2019-12-18 ENCOUNTER — Encounter (HOSPITAL_COMMUNITY): Payer: Self-pay | Admitting: Emergency Medicine

## 2019-12-18 ENCOUNTER — Other Ambulatory Visit: Payer: Self-pay

## 2019-12-18 ENCOUNTER — Emergency Department (HOSPITAL_COMMUNITY): Payer: PPO

## 2019-12-18 DIAGNOSIS — Z87891 Personal history of nicotine dependence: Secondary | ICD-10-CM | POA: Diagnosis not present

## 2019-12-18 DIAGNOSIS — E119 Type 2 diabetes mellitus without complications: Secondary | ICD-10-CM | POA: Insufficient documentation

## 2019-12-18 DIAGNOSIS — R072 Precordial pain: Secondary | ICD-10-CM

## 2019-12-18 DIAGNOSIS — Z79899 Other long term (current) drug therapy: Secondary | ICD-10-CM | POA: Diagnosis not present

## 2019-12-18 DIAGNOSIS — R0789 Other chest pain: Secondary | ICD-10-CM | POA: Insufficient documentation

## 2019-12-18 DIAGNOSIS — R42 Dizziness and giddiness: Secondary | ICD-10-CM | POA: Insufficient documentation

## 2019-12-18 DIAGNOSIS — I10 Essential (primary) hypertension: Secondary | ICD-10-CM | POA: Insufficient documentation

## 2019-12-18 DIAGNOSIS — R079 Chest pain, unspecified: Secondary | ICD-10-CM | POA: Diagnosis not present

## 2019-12-18 LAB — CBC
HCT: 45.5 % (ref 36.0–46.0)
Hemoglobin: 14.6 g/dL (ref 12.0–15.0)
MCH: 30.7 pg (ref 26.0–34.0)
MCHC: 32.1 g/dL (ref 30.0–36.0)
MCV: 95.6 fL (ref 80.0–100.0)
Platelets: 188 10*3/uL (ref 150–400)
RBC: 4.76 MIL/uL (ref 3.87–5.11)
RDW: 12.8 % (ref 11.5–15.5)
WBC: 4.3 10*3/uL (ref 4.0–10.5)
nRBC: 0 % (ref 0.0–0.2)

## 2019-12-18 LAB — BASIC METABOLIC PANEL
Anion gap: 9 (ref 5–15)
BUN: 12 mg/dL (ref 8–23)
CO2: 25 mmol/L (ref 22–32)
Calcium: 8.8 mg/dL — ABNORMAL LOW (ref 8.9–10.3)
Chloride: 105 mmol/L (ref 98–111)
Creatinine, Ser: 0.69 mg/dL (ref 0.44–1.00)
GFR calc Af Amer: >60 mL/min (ref >60)
GFR calc non Af Amer: >60 mL/min (ref >60)
Glucose, Bld: 179 mg/dL — ABNORMAL HIGH (ref 70–99)
Potassium: 3.8 mmol/L (ref 3.5–5.1)
Sodium: 139 mmol/L (ref 135–145)

## 2019-12-18 LAB — TROPONIN I (HIGH SENSITIVITY)
Troponin I (High Sensitivity): 4 ng/L (ref <18)
Troponin I (High Sensitivity): 12 ng/L (ref <18)

## 2019-12-18 MED ORDER — SODIUM CHLORIDE 0.9% FLUSH
3.0000 mL | Freq: Once | INTRAVENOUS | Status: AC
  Administered 2019-12-18: 3 mL via INTRAVENOUS

## 2019-12-18 NOTE — Discharge Instructions (Addendum)
Your work-up today was reassuring.  Your cardiologist Dr. Rayann Heman reviewed your EKG and work-up today.  He would like for you to follow-up with Dr. Tamala Julian or one of his colleagues this week.  Please call tomorrow to schedule follow-up appointment.  His team should be calling to arrange for follow-up as well.  If your chest pain returns take a full size aspirin and seek evaluation, especially if your symptoms worsen or if you develop shortness of breath, vomiting, loss of consciousness.

## 2019-12-18 NOTE — ED Provider Notes (Signed)
[facility] EMERGENCY DEPARTMENT Provider Note   CSN: [id number] Arrival date & time: 12/18/19  1032     History Chief Complaint  Patient presents with  . Chest Pain    [name] is a 69 y.o. female with history of hypertension, hyperlipidemia, obesity presents for evaluation of acute onset, intermittent chest pain for 3 days. She reports that on Friday night and last night respectively she experienced an episode of substernal chest pressure while laying in bed on her side. She reports that symptoms resolved with laying flat for about 20 minutes. Today at around 10:30 AM while getting dressed she developed similar substernal chest pressure which was now associated with nausea and lightheadedness. She states that the symptoms resolved after laying down and resting. She denies shortness of breath, vomiting, diaphoresis, syncope, or abdominal pain. She denies any changes to her diet recently. She has not tried any medical interventions for her symptoms. She reports she is never had similar pain. Her cardiologist is Dr. Tamala Julian but she has not seen him in a few years. She was told that she had an abnormal EKG previously. She is a former smoker, quit several decades ago. She denies leg swelling, recent travel or surgeries, hemoptysis, prior history of DVT or PE. She is not on estrogen hormone replacement therapy. She states that she was previously diabetic but that she had lost weight after starting the Keto diet and was taken off of diabetes medications as a result.  The history is provided by the patient.    HPI: A 69 year old patient with a history of hypertension, hypercholesterolemia and obesity presents for evaluation of chest pain. Initial onset of pain was approximately 3-6 hours ago. The patient's chest pain is described as heaviness/pressure/tightness and is not worse with exertion. The patient complains of nausea. The patient's chest pain is middle- or left-sided,  is not well-localized, is not sharp and does not radiate to the arms/jaw/neck. The patient denies diaphoresis. The patient has no history of stroke, has no history of peripheral artery disease, has not smoked in the past 90 days, denies any history of treated diabetes and has no relevant family history of coronary artery disease (first degree relative at less than age 97).   Past Medical History:  Diagnosis Date  . 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 Depression   . Gastro-esophageal reflux   . Hyperlipidemia   . Hypertension   . Memory disorder 10/30/2015  . RLS (restless legs syndrome)   . Thyroid disease   . Vitamin B 12 deficiency     Patient Active Problem List   Diagnosis Date Noted  . Long-term current use of opiate analgesic 07/09/2017  . Chronic pain syndrome 07/09/2017  . Lumbar spondylosis 07/09/2017  . 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 Memory disorder 10/30/2015  . 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 Dyspnea on exertion 09/20/2015  . Unstable angina ([place]) 09/20/2015  . URI, acute 09/20/2015  . Obesity, morbid, BMI 40.0-49.9 ([place]) 06/02/2014  . Hypertension   . Hyperlipidemia   . Diabetes ([place]) 12/02/2013  . Depression 05/27/2013  . Osteoporosis 05/02/2013  . GERD (gastroesophageal reflux disease) 05/25/2012  . Hypothyroidism 05/25/2012  . Restless legs syndrome 05/25/2012  . Vitamin D deficiency 05/25/2012    Past Surgical History:  Procedure Laterality Date  . ABDOMINAL HYSTERECTOMY    . APPENDECTOMY    . CATARACT EXTRACTION    . CHOLECYSTECTOMY    . JOINT REPLACEMENT  2 total knee replacement  . LASIK    . THYROIDECTOMY    . TONSILLECTOMY       OB History   No obstetric history on file.     Family History  Problem Relation Age of Onset  . Cancer Mother   . Hypertension Mother   . Cancer Father   . Diabetes Sister   . Heart disease Brother   . Diabetes Brother   . Diabetes Brother   . Breast cancer Neg Hx      Social History   Tobacco Use  . Smoking status: Former Smoker    Quit date: 02/17/1987    Years since quitting: 32.8  . Smokeless tobacco: Never Used  Substance Use Topics  . Alcohol use: No  . Drug use: No    Home Medications Prior to Admission medications   Medication Sig Start Date End Date Taking? Authorizing Provider  HYDROcodone-acetaminophen (NORCO) 10-325 MG tablet Take 1 tablet by mouth 2 (two) times daily as needed for pain. 12/01/19  Yes [provider]  ibuprofen (ADVIL) 200 MG tablet Take 200 mg by mouth every 4 (four) hours as needed for moderate pain.   Yes [provider]  losartan (COZAAR) 50 MG tablet Take 50 mg by mouth daily.    Yes [provider]  omeprazole (PRILOSEC) 40 MG capsule Take 40 mg by mouth daily as needed Jerrye Bushy).  08/24/15  Yes [provider]  Vitamin D, Ergocalciferol, (DRISDOL) 1.25 MG (50000 UNIT) CAPS capsule Take 50,000 Units by mouth once a week. 10/20/19  Yes [provider]    Allergies    Patient has no known allergies.  Review of Systems   Review of Systems  Constitutional: Negative for chills, diaphoresis and fever.  Respiratory: Negative for shortness of breath.   Cardiovascular: Positive for chest pain.  Gastrointestinal: Positive for nausea. Negative for abdominal pain and vomiting.  Neurological: Positive for light-headedness. Negative for syncope.  All other systems reviewed and are negative.   Physical Exam Updated Vital Signs BP (!) 149/80 (BP Location: Right Arm)   Pulse 66   Temp 97.7 F (36.5 C) (Oral)   Resp 12   Ht 5\' 2"  (1.575 m)   Wt 106.1 kg   SpO2 99%   BMI 42.80 kg/m   Physical Exam Vitals and nursing note reviewed.  Constitutional:      General: She is not in acute distress.    Appearance: She is well-developed.  HENT:     Head: Normocephalic and atraumatic.  Eyes:     General:        Right eye: No discharge.        Left eye: No discharge.      Conjunctiva/sclera: Conjunctivae normal.  Neck:     Vascular: No JVD.     Trachea: No tracheal deviation.  Cardiovascular:     Rate and Rhythm: Normal rate.     Pulses:          Radial pulses are 2+ on the right side and 2+ on the left side.       Dorsalis pedis pulses are 2+ on the right side and 2+ on the left side.       Posterior tibial pulses are 2+ on the right side and 2+ on the left side.     Comments: 2+ radial and DP/PT pulses bilaterally, Homans sign absent bilaterally, no lower extremity edema, no palpable cords, compartments are soft  Pulmonary:  Effort: Pulmonary effort is normal.  Chest:     Chest wall: Tenderness present.     Comments: Mild discomfort on palpation of the right parasternal region. No deformity or crepitus, ecchymosis or flail segment Abdominal:     General: There is no distension.  Musculoskeletal:     Right lower leg: No tenderness. No edema.     Left lower leg: No tenderness. No edema.  Skin:    Findings: No erythema.  Neurological:     Mental Status: She is alert.  Psychiatric:        Behavior: Behavior normal.     ED Results / Procedures / Treatments   Labs (all labs ordered are listed, but only abnormal results are displayed) Labs Reviewed  BASIC METABOLIC PANEL - Abnormal; Notable for the following components:      Result Value   Glucose, Bld 179 (*)    Calcium 8.8 (*)    All other components within normal limits  CBC  TROPONIN I (HIGH SENSITIVITY)  TROPONIN I (HIGH SENSITIVITY)    EKG EKG Interpretation  Date/Time:  Sunday December 18 2019 10:33:38 EDT Ventricular Rate:  70 PR Interval:  186 QRS Duration: 94 QT Interval:  404 QTC Calculation: 436 R Axis:   29 Text Interpretation: Normal sinus rhythm Anterior infarct , age undetermined Abnormal ECG No significant change since last tracing Confirmed by Isla Pence [phone]) on 12/18/2019 1:26:46 PM   Radiology DG Chest 2 View  Result Date: 12/18/2019 CLINICAL DATA:   Chest pain. EXAM: CHEST - 2 VIEW COMPARISON:  September 20, 2015 FINDINGS: Vascular crowding in the medial right lung base, stable. The heart, hila, mediastinum, lungs, and pleura are normal. No pneumothorax. IMPRESSION: No active cardiopulmonary disease. Electronically Signed   By: Dorise Bullion III M.D   On: 12/18/2019 10:58    Procedures Procedures (including critical care time)  Medications Ordered in ED Medications  sodium chloride flush (NS) 0.9 % injection 3 mL (3 mLs Intravenous Given 12/18/19 1403)    ED Course  I have reviewed the triage vital signs and the nursing notes.  Pertinent labs & imaging results that were available during my care of the patient were reviewed by me and considered in my medical decision making (see chart for details).    MDM Rules/Calculators/A&P HEAR Score: 6                        Patient presenting for evaluation of intermittent chest pains.  The pain is nonpleuritic.  Today it occurred while she was getting dressed, improved with rest.  Currently chest pain-free.  She is afebrile, mildly hypertensive in the ED.  Clinically she is well-appearing.  She has some soreness to palpation of the right side of the chest which could suggest potentially musculoskeletal etiology.  Her EKG today shows no acute ischemic abnormalities.  However, she does have some cardiac risk factors so we will obtain troponin, chest x-ray, blood work and consult cardiology for further recommendations.  Lab work reviewed and interpreted by myself shows no leukocytosis, no anemia, no metabolic derangements or renal insufficiency.  She is hyperglycemic but does not appear to be in DKA.  Serial troponins are negative though she did have a mild increase from 4 to 12.  Chest x-ray shows no acute cardiopulmonary abnormalities.  Doubt PE, cardiac tamponade, esophageal rupture, pneumonia, pneumothorax.  3:05 PM CONSULT: Spoke with Dr. Rayann Heman with on-call cardiology service.  He has reviewed the  patient's  EKG and work-up thus far in the ED.  He feels it would be reasonable to follow-up outpatient this week and will send a message to Dr. Thompson Caul team to help arrange this.  On reevaluation the patient is resting comfortably, continues to be chest pain-free.  I discussed Dr. Jackalyn Lombard recommendations and plan for close follow-up outpatient this week and she is agreeable to this.  We discussed strict ED return precautions.  Patient and sister verbalized understanding of and agreement with plan and patient is stable for discharge at this time.  Patient was seen and evaluated by Dr. Gilford Raid who agrees with assessment and plan at this time.  Final Clinical Impression(s) / ED Diagnoses Final diagnoses:  Substernal chest pain relieved by rest    Rx / DC Orders ED Discharge Orders    None       Debroah Baller 12/18/19 1600    Isla Pence, MD 12/21/19 [phone]

## 2019-12-18 NOTE — ED Triage Notes (Signed)
C/o intermittent pain across chest x 3 days with SOB, nausea, and dizziness.  Pain relieved if she lays down.

## 2019-12-20 NOTE — H&P (View-Only) (Signed)
Cardiology Office Note:    Date:  12/21/2019   ID:  [name], DOB 1950/11/23, MRN [id number]  PCP:  Lisa Calamity, MD  Cardiologist:  No primary care provider on file.   Referring MD: [name],*   Chief Complaint  Patient presents with  . Coronary Artery Disease    New onset angina    History of Present Illness:    [name] is a 69 y.o. female with a hx of hypertension, obesity, hyperlipidemia, DM II, who is being seen for chest pain. Prior h/o palpitations without significant arrhythmia.  3 episodes of substernal pressure, occurring spontaneously and lasting up to 20 minutes.  Episodes have occurred at rest.  Usually resolve gradually.  There is some associated nausea and diaphoresis.  She denies dyspnea.  She has had no exertional complaints.  She has a positive family history of heart disease with both of her brothers having early coronary disease.  Her older brother, [name] recently died and is a patient that I had cared for her for years.  She has lost over 100 pounds.  She was diabetic before she lost the weight.  She has hypertension.  She does not smoke, she does not drink, but does have hyperlipidemia.  Past Medical History:  Diagnosis Date  . 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 Chronic pain syndrome 07/09/2017  . Depression   . Diabetes ([place]) 12/02/2013   Last Assessment & Plan:  Formatting of this note might be different from the original. Will try to see if metformin XR is better tolerated for patient.  WIll start with 500 mg QHS.  Continue amaryl for now.  i suspect she may be able to decrease or discontinue at follow-up  Lab Results  Component Value Date   HGBA1C 5.4 03/01/2014   : Last Assessment & Plan:  Formatting of this note may be different  . Dyspnea on exertion 09/20/2015  . Gastro-esophageal reflux   . GERD (gastroesophageal reflux disease) 05/25/2012  . Hyperlipidemia   .  Hypertension   . Hypothyroidism 05/25/2012   Last Assessment & Plan:  States was started after a partial thyroidectomy but does not recall any abnormal lab value and no symptoms.  Discussed as is on low dose, would be reasonable to discontinue and recheck in 6-8 weeks which she would like to do. Last Assessment & Plan:  States was started after a partial thyroidectomy but does not recall any abnormal lab value and no symptoms.  Discussed as   . Lumbar spondylosis 07/09/2017  . Memory disorder 10/30/2015  . Obesity, morbid, BMI 40.0-49.9 ([place]) 06/02/2014  . Osteoporosis 05/02/2013   Overview:  DEXA 11/14 DEXA 11/14  . Restless legs syndrome 05/25/2012   Last Assessment & Plan:  patient reports using clonazepam about once weekly .  Discussed nonpharmacologic tips for treating RLS.  States other medicatiosn she had treid had not been effective (does not remember name) Will refill for now.  Advised if use increases, will readdress in future  . RLS (restless legs syndrome)   . Thyroid disease   . Unstable angina ([place]) 09/20/2015  . URI, acute 09/20/2015  . Vitamin B 12 deficiency   . Vitamin D deficiency 05/25/2012    Past Surgical History:  Procedure Laterality Date  . ABDOMINAL HYSTERECTOMY    . APPENDECTOMY    . CATARACT EXTRACTION    . CHOLECYSTECTOMY    . JOINT REPLACEMENT  2 total knee replacement  . LASIK    . THYROIDECTOMY    . TONSILLECTOMY      Current Medications: Current Meds  Medication Sig  . HYDROcodone-acetaminophen (NORCO) 10-325 MG tablet Take 1 tablet by mouth 2 (two) times daily as needed for pain.  Marland Kitchen ibuprofen (ADVIL) 200 MG tablet Take 200 mg by mouth every 4 (four) hours as needed for moderate pain.  Marland Kitchen losartan (COZAAR) 50 MG tablet Take 50 mg by mouth daily.   Marland Kitchen omeprazole (PRILOSEC) 40 MG capsule Take 40 mg by mouth daily as needed Jerrye Bushy).   . Vitamin D, Ergocalciferol, (DRISDOL) 1.25 MG (50000 UNIT) CAPS capsule Take 50,000 Units by mouth once a week.      Allergies:   Patient has no known allergies.   Social History   Socioeconomic History  . Marital status: Divorced    Spouse name: Not on file  . Number of children: Not on file  . Years of education: Not on file  . Highest education level: Not on file  Occupational History  . Not on file  Tobacco Use  . Smoking status: Former Smoker    Quit date: 02/17/1987    Years since quitting: 32.8  . Smokeless tobacco: Never Used  Substance and Sexual Activity  . Alcohol use: No  . Drug use: No  . Sexual activity: Not on file  Other Topics Concern  . Not on file  Social History Narrative   Lives at home alone   Right-handed   Social Determinants of Health   Financial Resource Strain:   . Difficulty of Paying Living Expenses:   Food Insecurity:   . Worried About Charity fundraiser in the Last Year:   . Arboriculturist in the Last Year:   Transportation Needs:   . Film/video editor (Medical):   Marland Kitchen Lack of Transportation (Non-Medical):   Physical Activity:   . Days of Exercise per Week:   . Minutes of Exercise per Session:   Stress:   . Feeling of Stress :   Social Connections:   . Frequency of Communication with Friends and Family:   . Frequency of Social Gatherings with Friends and Family:   . Attends Religious Services:   . Active Member of Clubs or Organizations:   . Attends Archivist Meetings:   Marland Kitchen Marital Status:      Family History: The patient's family history includes Cancer in her father and mother; Diabetes in her brother, brother, and sister; Heart disease in her brother; Hypertension in her mother. There is no history of Breast cancer.  ROS:   Please see the history of present illness.    Sleeps well.  Denies snoring.  Denies palpitations.  Has not had blood in the urine or stool.  Her only medications at this time are Cozaar 50 mg/day.  Medications for diabetes have been stopped.  All other systems reviewed and are negative.  EKGs/Labs/Other  Studies Reviewed:    The following studies were reviewed today: No previous cardiac work-up.  EKG:  EKG normal sinus rhythm, nonspecific ST abnormality, poor R wave progression, on EKG performed on 12/19/2019.  Recent Labs: 12/18/2019: BUN 12; Creatinine, Ser 0.69; Hemoglobin 14.6; Platelets 188; Potassium 3.8; Sodium 139  Recent Lipid Panel No results found for: CHOL, TRIG, HDL, CHOLHDL, VLDL, LDLCALC, LDLDIRECT  Physical Exam:    VS:  BP 132/86   Pulse 68   Ht 5\' 2"  (1.575 m)   Wt 257  lb (116.6 kg)   SpO2 98%   BMI 47.01 kg/m     Wt Readings from Last 3 Encounters:  12/21/19 257 lb (116.6 kg)  12/18/19 234 lb (106.1 kg)  03/21/16 288 lb (130.6 kg)     GEN: Moderate obesity.. No acute distress HEENT: Normal NECK: No JVD. LYMPHATICS: No lymphadenopathy CARDIAC:  RRR without murmur, gallop, or edema. VASCULAR: Bilateral palpable radial pulses.  Normal Pulses. No bruits. RESPIRATORY:  Clear to auscultation without rales, wheezing or rhonchi  ABDOMEN: Soft, non-tender, non-distended, No pulsatile mass, MUSCULOSKELETAL: No deformity  SKIN: Warm and dry NEUROLOGIC:  Alert and oriented x 3 PSYCHIATRIC:  Normal affect   ASSESSMENT:    1. Chest pain of uncertain etiology   2. Hyperlipidemia LDL goal <70   3. Essential hypertension   4. Long-term current use of opiate analgesic   5. Obesity, morbid, BMI 40.0-49.9 ([place])   6. Controlled type 2 diabetes mellitus without complication, without long-term current use of insulin ([place])   7. Educated about COVID-19 virus infection    PLAN:    In order of problems listed above:  1. Concerning for angina.  Given family history, and other risk factors including obesity, hyperglycemia, hypertension, and hyperlipidemia, I have recommended coronary angiography as the most direct way to identify the presence or absence of obstructive coronary disease.  Nitroglycerin 0.4 mg tablets are prescribed along with 81 mg aspirin per day.  Coronary  angiography will be performed and within the next 7 days.  Report to emergency room earlier if prolonged pain not relieved by nitroglycerin.  The patient was counseled to undergo left heart catheterization, coronary angiography, and possible percutaneous coronary intervention with stent implantation. The procedural risks and benefits were discussed in detail. The risks discussed included death, stroke, myocardial infarction, life-threatening bleeding, limb ischemia, kidney injury, allergy, and possible emergency cardiac surgery. The risk of these significant complications were estimated to occur less than 1% of the time. After discussion, the patient has agreed to proceed.    Medication Adjustments/Labs and Tests Ordered: Current medicines are reviewed at length with the patient today.  Concerns regarding medicines are outlined above.  No orders of the defined types were placed in this encounter.  No orders of the defined types were placed in this encounter.   There are no Patient Instructions on file for this visit.   Signed, Sinclair Grooms, MD  12/21/2019 10:24 AM    [place]

## 2019-12-20 NOTE — Progress Notes (Signed)
Cardiology Office Note:    Date:  12/21/2019   ID:  [name], DOB 05/24/1951, MRN [id number]  PCP:  Lisa Calamity, MD  Cardiologist:  No primary care provider on file.   Referring MD: [name],*   Chief Complaint  Patient presents with  . Coronary Artery Disease    New onset angina    History of Present Illness:    [name] is a 69 y.o. female with a hx of hypertension, obesity, hyperlipidemia, DM II, who is being seen for chest pain. Prior h/o palpitations without significant arrhythmia.  3 episodes of substernal pressure, occurring spontaneously and lasting up to 20 minutes.  Episodes have occurred at rest.  Usually resolve gradually.  There is some associated nausea and diaphoresis.  She denies dyspnea.  She has had no exertional complaints.  She has a positive family history of heart disease with both of her brothers having early coronary disease.  Her older brother, [name] recently died and is a patient that I had cared for her for years.  She has lost over 100 pounds.  She was diabetic before she lost the weight.  She has hypertension.  She does not smoke, she does not drink, but does have hyperlipidemia.  Past Medical History:  Diagnosis Date  . 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 Chronic pain syndrome 07/09/2017  . Depression   . Diabetes ([place]) 12/02/2013   Last Assessment & Plan:  Formatting of this note might be different from the original. Will try to see if metformin XR is better tolerated for patient.  WIll start with 500 mg QHS.  Continue amaryl for now.  i suspect she may be able to decrease or discontinue at follow-up  Lab Results  Component Value Date   HGBA1C 5.4 03/01/2014   : Last Assessment & Plan:  Formatting of this note may be different  . Dyspnea on exertion 09/20/2015  . Gastro-esophageal reflux   . GERD (gastroesophageal reflux disease) 05/25/2012  . Hyperlipidemia   .  Hypertension   . Hypothyroidism 05/25/2012   Last Assessment & Plan:  States was started after a partial thyroidectomy but does not recall any abnormal lab value and no symptoms.  Discussed as is on low dose, would be reasonable to discontinue and recheck in 6-8 weeks which she would like to do. Last Assessment & Plan:  States was started after a partial thyroidectomy but does not recall any abnormal lab value and no symptoms.  Discussed as   . Lumbar spondylosis 07/09/2017  . Memory disorder 10/30/2015  . Obesity, morbid, BMI 40.0-49.9 ([place]) 06/02/2014  . Osteoporosis 05/02/2013   Overview:  DEXA 11/14 DEXA 11/14  . Restless legs syndrome 05/25/2012   Last Assessment & Plan:  patient reports using clonazepam about once weekly .  Discussed nonpharmacologic tips for treating RLS.  States other medicatiosn she had treid had not been effective (does not remember name) Will refill for now.  Advised if use increases, will readdress in future  . RLS (restless legs syndrome)   . Thyroid disease   . Unstable angina ([place]) 09/20/2015  . URI, acute 09/20/2015  . Vitamin B 12 deficiency   . Vitamin D deficiency 05/25/2012    Past Surgical History:  Procedure Laterality Date  . ABDOMINAL HYSTERECTOMY    . APPENDECTOMY    . CATARACT EXTRACTION    . CHOLECYSTECTOMY    . JOINT REPLACEMENT  2 total knee replacement  . LASIK    . THYROIDECTOMY    . TONSILLECTOMY      Current Medications: Current Meds  Medication Sig  . HYDROcodone-acetaminophen (NORCO) 10-325 MG tablet Take 1 tablet by mouth 2 (two) times daily as needed for pain.  Marland Kitchen ibuprofen (ADVIL) 200 MG tablet Take 200 mg by mouth every 4 (four) hours as needed for moderate pain.  Marland Kitchen losartan (COZAAR) 50 MG tablet Take 50 mg by mouth daily.   Marland Kitchen omeprazole (PRILOSEC) 40 MG capsule Take 40 mg by mouth daily as needed Jerrye Bushy).   . Vitamin D, Ergocalciferol, (DRISDOL) 1.25 MG (50000 UNIT) CAPS capsule Take 50,000 Units by mouth once a week.      Allergies:   Patient has no known allergies.   Social History   Socioeconomic History  . Marital status: Divorced    Spouse name: Not on file  . Number of children: Not on file  . Years of education: Not on file  . Highest education level: Not on file  Occupational History  . Not on file  Tobacco Use  . Smoking status: Former Smoker    Quit date: 02/17/1987    Years since quitting: 32.8  . Smokeless tobacco: Never Used  Substance and Sexual Activity  . Alcohol use: No  . Drug use: No  . Sexual activity: Not on file  Other Topics Concern  . Not on file  Social History Narrative   Lives at home alone   Right-handed   Social Determinants of Health   Financial Resource Strain:   . Difficulty of Paying Living Expenses:   Food Insecurity:   . Worried About Charity fundraiser in the Last Year:   . Arboriculturist in the Last Year:   Transportation Needs:   . Film/video editor (Medical):   Marland Kitchen Lack of Transportation (Non-Medical):   Physical Activity:   . Days of Exercise per Week:   . Minutes of Exercise per Session:   Stress:   . Feeling of Stress :   Social Connections:   . Frequency of Communication with Friends and Family:   . Frequency of Social Gatherings with Friends and Family:   . Attends Religious Services:   . Active Member of Clubs or Organizations:   . Attends Archivist Meetings:   Marland Kitchen Marital Status:      Family History: The patient's family history includes Cancer in her father and mother; Diabetes in her brother, brother, and sister; Heart disease in her brother; Hypertension in her mother. There is no history of Breast cancer.  ROS:   Please see the history of present illness.    Sleeps well.  Denies snoring.  Denies palpitations.  Has not had blood in the urine or stool.  Her only medications at this time are Cozaar 50 mg/day.  Medications for diabetes have been stopped.  All other systems reviewed and are negative.  EKGs/Labs/Other  Studies Reviewed:    The following studies were reviewed today: No previous cardiac work-up.  EKG:  EKG normal sinus rhythm, nonspecific ST abnormality, poor R wave progression, on EKG performed on 12/19/2019.  Recent Labs: 12/18/2019: BUN 12; Creatinine, Ser 0.69; Hemoglobin 14.6; Platelets 188; Potassium 3.8; Sodium 139  Recent Lipid Panel No results found for: CHOL, TRIG, HDL, CHOLHDL, VLDL, LDLCALC, LDLDIRECT  Physical Exam:    VS:  BP 132/86   Pulse 68   Ht 5\' 2"  (1.575 m)   Wt 257  lb (116.6 kg)   SpO2 98%   BMI 47.01 kg/m     Wt Readings from Last 3 Encounters:  12/21/19 257 lb (116.6 kg)  12/18/19 234 lb (106.1 kg)  03/21/16 288 lb (130.6 kg)     GEN: Moderate obesity.. No acute distress HEENT: Normal NECK: No JVD. LYMPHATICS: No lymphadenopathy CARDIAC:  RRR without murmur, gallop, or edema. VASCULAR: Bilateral palpable radial pulses.  Normal Pulses. No bruits. RESPIRATORY:  Clear to auscultation without rales, wheezing or rhonchi  ABDOMEN: Soft, non-tender, non-distended, No pulsatile mass, MUSCULOSKELETAL: No deformity  SKIN: Warm and dry NEUROLOGIC:  Alert and oriented x 3 PSYCHIATRIC:  Normal affect   ASSESSMENT:    1. Chest pain of uncertain etiology   2. Hyperlipidemia LDL goal <70   3. Essential hypertension   4. Long-term current use of opiate analgesic   5. Obesity, morbid, BMI 40.0-49.9 ([place])   6. Controlled type 2 diabetes mellitus without complication, without long-term current use of insulin ([place])   7. Educated about COVID-19 virus infection    PLAN:    In order of problems listed above:  1. Concerning for angina.  Given family history, and other risk factors including obesity, hyperglycemia, hypertension, and hyperlipidemia, I have recommended coronary angiography as the most direct way to identify the presence or absence of obstructive coronary disease.  Nitroglycerin 0.4 mg tablets are prescribed along with 81 mg aspirin per day.  Coronary  angiography will be performed and within the next 7 days.  Report to emergency room earlier if prolonged pain not relieved by nitroglycerin.  The patient was counseled to undergo left heart catheterization, coronary angiography, and possible percutaneous coronary intervention with stent implantation. The procedural risks and benefits were discussed in detail. The risks discussed included death, stroke, myocardial infarction, life-threatening bleeding, limb ischemia, kidney injury, allergy, and possible emergency cardiac surgery. The risk of these significant complications were estimated to occur less than 1% of the time. After discussion, the patient has agreed to proceed.    Medication Adjustments/Labs and Tests Ordered: Current medicines are reviewed at length with the patient today.  Concerns regarding medicines are outlined above.  No orders of the defined types were placed in this encounter.  No orders of the defined types were placed in this encounter.   There are no Patient Instructions on file for this visit.   Signed, Sinclair Grooms, MD  12/21/2019 10:24 AM    [place]

## 2019-12-21 ENCOUNTER — Encounter: Payer: Self-pay | Admitting: Interventional Cardiology

## 2019-12-21 ENCOUNTER — Ambulatory Visit: Payer: PPO | Admitting: Interventional Cardiology

## 2019-12-21 ENCOUNTER — Telehealth: Payer: Self-pay

## 2019-12-21 ENCOUNTER — Other Ambulatory Visit: Payer: Self-pay

## 2019-12-21 VITALS — BP 132/86 | HR 68 | Ht 62.0 in | Wt 257.0 lb

## 2019-12-21 DIAGNOSIS — Z79891 Long term (current) use of opiate analgesic: Secondary | ICD-10-CM | POA: Diagnosis not present

## 2019-12-21 DIAGNOSIS — E119 Type 2 diabetes mellitus without complications: Secondary | ICD-10-CM

## 2019-12-21 DIAGNOSIS — R079 Chest pain, unspecified: Secondary | ICD-10-CM

## 2019-12-21 DIAGNOSIS — E785 Hyperlipidemia, unspecified: Secondary | ICD-10-CM | POA: Diagnosis not present

## 2019-12-21 DIAGNOSIS — I10 Essential (primary) hypertension: Secondary | ICD-10-CM | POA: Diagnosis not present

## 2019-12-21 DIAGNOSIS — Z7189 Other specified counseling: Secondary | ICD-10-CM

## 2019-12-21 MED ORDER — NITROGLYCERIN 0.4 MG SL SUBL
0.4000 mg | SUBLINGUAL_TABLET | SUBLINGUAL | 3 refills | Status: DC | PRN
Start: 2019-12-21 — End: 2020-04-04

## 2019-12-21 MED ORDER — ASPIRIN EC 81 MG PO TBEC
81.0000 mg | DELAYED_RELEASE_TABLET | Freq: Every day | ORAL | 3 refills | Status: DC
Start: 2019-12-21 — End: 2020-04-04

## 2019-12-21 NOTE — Patient Instructions (Signed)
Medication Instructions:  1) CONTINUE Aspirin 81mg  once daily 2)  A prescription has been sent in for Nitroglycerin.  If you have chest pain that doesn't relieve quickly, place one tablet under your tongue and allow it to dissolve.  If no relief after 5 minutes, you may take another pill.  If no relief after 5 minutes, you may take a 3rd dose but you need to call 911 and report to ER immediately.  *If you need a refill on your cardiac medications before your next appointment, please call your pharmacy*   Lab Work: None If you have labs (blood work) drawn today and your tests are completely normal, you will receive your results only by: Marland Kitchen MyChart Message (if you have MyChart) OR . A paper copy in the mail If you have any lab test that is abnormal or we need to change your treatment, we will call you to review the results.   Testing/Procedures: Your physician has requested that you have a cardiac catheterization. Cardiac catheterization is used to diagnose and/or treat various heart conditions. Doctors may recommend this procedure for a number of different reasons. The most common reason is to evaluate chest pain. Chest pain can be a symptom of coronary artery disease (CAD), and cardiac catheterization can show whether plaque is narrowing or blocking your heart's arteries. This procedure is also used to evaluate the valves, as well as measure the blood flow and oxygen levels in different parts of your heart. For further information please visit [email or web address].[email or web address]. Please follow instruction sheet, as given.    Follow-Up: At [facility], you and your health needs are our priority.  As part of our continuing mission to provide you with exceptional heart care, we have created designated Provider Care Teams.  These Care Teams include your primary Cardiologist (physician) and Advanced Practice Providers (APPs -  Physician Assistants and Nurse Practitioners) who all work together to provide you with  the care you need, when you need it.  We recommend signing up for the patient portal called "MyChart".  Sign up information is provided on this After Visit Summary.  MyChart is used to connect with patients for Virtual Visits (Telemedicine).  Patients are able to view lab/test results, encounter notes, upcoming appointments, etc.  Non-urgent messages can be sent to your provider as well.   To learn more about what you can do with MyChart, go to [email or web address].    Your next appointment:   2-3 week(s)  The format for your next appointment:   In Person  Provider:   You may see Dr. Daneen Schick or one of the following Advanced Practice Providers on your designated Care Team:    Truitt Merle, NP  Cecilie Kicks, NP  Kathyrn Drown, NP    Other Instructions     [facility] OFFICE [place], [place] [address] Dept: [phone] Loc: [place]  12/21/2019  You are scheduled for a Cardiac Catheterization on Friday, July 2 with Dr. Daneen Schick.  1. Please arrive at the [facility] (Main Entrance [facility] at [facility]: [address] [place], [place] [address] at 5:30 AM (This time is two hours before your procedure to ensure your preparation). Free valet parking service is available.   Special note: Every effort is made to have your procedure done on time. Please understand that emergencies sometimes delay scheduled procedures.  2. Diet: Do not eat solid foods  after midnight.  The patient may have clear liquids until 5am upon the day of the procedure.  3. Labs: You had labs drawn at your recent hospitalization.  No need to be drawn again.  4. Medication instructions in preparation for your procedure:   Contrast Allergy: No   On the morning of your procedure, take your Aspirin and any morning medicines NOT listed above.  You may use sips of  water.  5. Plan for one night stay--bring personal belongings. 6. Bring a current list of your medications and current insurance cards. 7. You MUST have a responsible person to drive you home. 8. Someone MUST be with you the first 24 hours after you arrive home or your discharge will be delayed. 9. Please wear clothes that are easy to get on and off and wear slip-on shoes.  Thank you for allowing [place] to care for you!   -- [facility]

## 2019-12-21 NOTE — Telephone Encounter (Signed)
Pt contacted pre-catheterization scheduled at [facility] for: 12/23/19 Friday.  Verified arrival time and place: [facility]) at: 5:30 am.   No solid food after midnight prior to cath, clear liquids until 5 AM day of procedure.  AM meds can be  taken pre-cath with sips of water including: ASA 81 mg  Confirmed patient has responsible adult to drive home post procedure and observe 24 hours after arriving home:   You are allowed ONE visitor in the waiting room during your procedure. Both you and your visitor must wear masks.   Pt has had her COVID vaccines 07/2019 with [place] at the [facility].

## 2019-12-23 ENCOUNTER — Ambulatory Visit (HOSPITAL_COMMUNITY)
Admission: RE | Admit: 2019-12-23 | Discharge: 2019-12-24 | Disposition: A | Discharge status: Home / Self Care | Payer: PPO | Attending: Interventional Cardiology | Admitting: Interventional Cardiology

## 2019-12-23 ENCOUNTER — Other Ambulatory Visit: Payer: Self-pay

## 2019-12-23 ENCOUNTER — Encounter (HOSPITAL_COMMUNITY)
Admission: RE | Disposition: A | Discharge status: Home / Self Care | Payer: Self-pay | Source: Home / Self Care | Attending: Interventional Cardiology

## 2019-12-23 ENCOUNTER — Encounter (HOSPITAL_COMMUNITY): Payer: Self-pay | Admitting: Interventional Cardiology

## 2019-12-23 DIAGNOSIS — Z79899 Other long term (current) drug therapy: Secondary | ICD-10-CM | POA: Insufficient documentation

## 2019-12-23 DIAGNOSIS — E785 Hyperlipidemia, unspecified: Secondary | ICD-10-CM | POA: Diagnosis present

## 2019-12-23 DIAGNOSIS — G2581 Restless legs syndrome: Secondary | ICD-10-CM | POA: Insufficient documentation

## 2019-12-23 DIAGNOSIS — I25118 Atherosclerotic heart disease of native coronary artery with other forms of angina pectoris: Secondary | ICD-10-CM | POA: Insufficient documentation

## 2019-12-23 DIAGNOSIS — I200 Unstable angina: Secondary | ICD-10-CM | POA: Diagnosis present

## 2019-12-23 DIAGNOSIS — E039 Hypothyroidism, unspecified: Secondary | ICD-10-CM | POA: Insufficient documentation

## 2019-12-23 DIAGNOSIS — I209 Angina pectoris, unspecified: Secondary | ICD-10-CM | POA: Insufficient documentation

## 2019-12-23 DIAGNOSIS — Z6841 Body Mass Index (BMI) 40.0 and over, adult: Secondary | ICD-10-CM | POA: Diagnosis not present

## 2019-12-23 DIAGNOSIS — G894 Chronic pain syndrome: Secondary | ICD-10-CM | POA: Insufficient documentation

## 2019-12-23 DIAGNOSIS — I10 Essential (primary) hypertension: Secondary | ICD-10-CM | POA: Diagnosis not present

## 2019-12-23 DIAGNOSIS — S301XXA Contusion of abdominal wall, initial encounter: Secondary | ICD-10-CM

## 2019-12-23 DIAGNOSIS — E119 Type 2 diabetes mellitus without complications: Secondary | ICD-10-CM | POA: Insufficient documentation

## 2019-12-23 DIAGNOSIS — Z87891 Personal history of nicotine dependence: Secondary | ICD-10-CM | POA: Insufficient documentation

## 2019-12-23 DIAGNOSIS — K219 Gastro-esophageal reflux disease without esophagitis: Secondary | ICD-10-CM | POA: Diagnosis not present

## 2019-12-23 DIAGNOSIS — Z79891 Long term (current) use of opiate analgesic: Secondary | ICD-10-CM | POA: Insufficient documentation

## 2019-12-23 HISTORY — PX: LEFT HEART CATH AND CORONARY ANGIOGRAPHY: CATH118249

## 2019-12-23 HISTORY — PX: CARDIAC CATHETERIZATION: SHX172

## 2019-12-23 HISTORY — DX: Atherosclerotic heart disease of native coronary artery without angina pectoris: I25.10

## 2019-12-23 HISTORY — PX: CORONARY STENT INTERVENTION: CATH118234

## 2019-12-23 LAB — HEMOGLOBIN AND HEMATOCRIT, BLOOD
HCT: 42.7 % (ref 36.0–46.0)
HCT: 39.7 % (ref 36.0–46.0)
Hemoglobin: 13.9 g/dL (ref 12.0–15.0)
Hemoglobin: 13.2 g/dL (ref 12.0–15.0)

## 2019-12-23 LAB — POCT ACTIVATED CLOTTING TIME
Activated Clotting Time: 191 seconds
Activated Clotting Time: 395 seconds

## 2019-12-23 LAB — GLUCOSE, CAPILLARY
Glucose-Capillary: 118 mg/dL — ABNORMAL HIGH (ref 70–99)
Glucose-Capillary: 104 mg/dL — ABNORMAL HIGH (ref 70–99)

## 2019-12-23 SURGERY — LEFT HEART CATH AND CORONARY ANGIOGRAPHY
Anesthesia: LOCAL

## 2019-12-23 MED ORDER — HYDRALAZINE HCL 20 MG/ML IJ SOLN: 10.0000 mg | INTRAMUSCULAR | Status: AC | PRN

## 2019-12-23 MED ORDER — SODIUM CHLORIDE 0.9% FLUSH
3.0000 mL | Freq: Two times a day (BID) | INTRAVENOUS | Status: DC
  Administered 2019-12-23 – 2019-12-24 (×2): 3 mL via INTRAVENOUS

## 2019-12-23 MED ORDER — IOHEXOL 350 MG/ML SOLN
INTRAVENOUS | Status: DC | PRN
  Administered 2019-12-23: 205 mL via INTRACARDIAC

## 2019-12-23 MED ORDER — TICAGRELOR 90 MG PO TABS
90.0000 mg | ORAL_TABLET | Freq: Two times a day (BID) | ORAL | Status: DC
  Administered 2019-12-23 – 2019-12-24 (×2): 90 mg via ORAL
  Filled 2019-12-23 (×2): qty 1

## 2019-12-23 MED ORDER — NITROGLYCERIN 1 MG/10 ML FOR IR/CATH LAB
INTRA_ARTERIAL | Status: AC
  Filled 2019-12-23: qty 10

## 2019-12-23 MED ORDER — METOPROLOL SUCCINATE ER 25 MG PO TB24
25.0000 mg | ORAL_TABLET | Freq: Every day | ORAL | Status: DC
  Administered 2019-12-23 – 2019-12-24 (×2): 25 mg via ORAL
  Filled 2019-12-23 (×2): qty 1

## 2019-12-23 MED ORDER — TICAGRELOR 90 MG PO TABS
ORAL_TABLET | ORAL | Status: AC
  Filled 2019-12-23: qty 2

## 2019-12-23 MED ORDER — SODIUM CHLORIDE 0.9 % IV SOLN: INTRAVENOUS | Status: AC

## 2019-12-23 MED ORDER — MIDAZOLAM HCL 2 MG/2ML IJ SOLN
INTRAMUSCULAR | Status: AC
  Filled 2019-12-23: qty 2

## 2019-12-23 MED ORDER — LABETALOL HCL 5 MG/ML IV SOLN
INTRAVENOUS | Status: AC
  Filled 2019-12-23: qty 4

## 2019-12-23 MED ORDER — NITROGLYCERIN 1 MG/10 ML FOR IR/CATH LAB
INTRA_ARTERIAL | Status: DC | PRN
  Administered 2019-12-23 (×2): 200 ug via INTRACORONARY

## 2019-12-23 MED ORDER — ASPIRIN 81 MG PO CHEW: 81.0000 mg | CHEWABLE_TABLET | ORAL | Status: DC

## 2019-12-23 MED ORDER — SODIUM CHLORIDE 0.9 % IV SOLN
INTRAVENOUS | Status: DC | PRN
  Administered 2019-12-23: 1.75 mg/kg/h via INTRAVENOUS

## 2019-12-23 MED ORDER — SODIUM CHLORIDE 0.9% FLUSH: 3.0000 mL | INTRAVENOUS | Status: DC | PRN

## 2019-12-23 MED ORDER — SODIUM CHLORIDE 0.9% FLUSH: 3.0000 mL | Freq: Two times a day (BID) | INTRAVENOUS | Status: DC

## 2019-12-23 MED ORDER — HEPARIN (PORCINE) IN NACL 1000-0.9 UT/500ML-% IV SOLN
INTRAVENOUS | Status: AC
  Filled 2019-12-23: qty 1000

## 2019-12-23 MED ORDER — HEPARIN SODIUM (PORCINE) 1000 UNIT/ML IJ SOLN
INTRAMUSCULAR | Status: DC | PRN
  Administered 2019-12-23: 5500 [IU] via INTRAVENOUS

## 2019-12-23 MED ORDER — FENTANYL CITRATE (PF) 100 MCG/2ML IJ SOLN
INTRAMUSCULAR | Status: DC | PRN
  Administered 2019-12-23 (×4): 25 ug via INTRAVENOUS

## 2019-12-23 MED ORDER — ATORVASTATIN CALCIUM 80 MG PO TABS
80.0000 mg | ORAL_TABLET | Freq: Every day | ORAL | Status: DC
  Administered 2019-12-23 – 2019-12-24 (×2): 80 mg via ORAL
  Filled 2019-12-23 (×2): qty 1

## 2019-12-23 MED ORDER — LIDOCAINE HCL (PF) 1 % IJ SOLN
INTRAMUSCULAR | Status: AC
  Filled 2019-12-23: qty 30

## 2019-12-23 MED ORDER — HEPARIN (PORCINE) IN NACL 1000-0.9 UT/500ML-% IV SOLN
INTRAVENOUS | Status: DC | PRN
  Administered 2019-12-23 (×3): 500 mL

## 2019-12-23 MED ORDER — OXYCODONE HCL 5 MG PO TABS
5.0000 mg | ORAL_TABLET | ORAL | Status: DC | PRN
  Administered 2019-12-23 – 2019-12-24 (×2): 5 mg via ORAL
  Filled 2019-12-23 (×2): qty 1

## 2019-12-23 MED ORDER — LOSARTAN POTASSIUM 50 MG PO TABS
50.0000 mg | ORAL_TABLET | Freq: Every day | ORAL | Status: DC
  Administered 2019-12-23 – 2019-12-24 (×2): 50 mg via ORAL
  Filled 2019-12-23 (×2): qty 1

## 2019-12-23 MED ORDER — LABETALOL HCL 5 MG/ML IV SOLN
INTRAVENOUS | Status: DC | PRN
  Administered 2019-12-23: 10 mg via INTRAVENOUS

## 2019-12-23 MED ORDER — TICAGRELOR 90 MG PO TABS
ORAL_TABLET | ORAL | Status: DC | PRN
  Administered 2019-12-23: 180 mg via ORAL

## 2019-12-23 MED ORDER — VERAPAMIL HCL 2.5 MG/ML IV SOLN
INTRAVENOUS | Status: DC | PRN
  Administered 2019-12-23: 10 mL via INTRA_ARTERIAL

## 2019-12-23 MED ORDER — VERAPAMIL HCL 2.5 MG/ML IV SOLN
INTRAVENOUS | Status: AC
  Filled 2019-12-23: qty 2

## 2019-12-23 MED ORDER — SODIUM CHLORIDE 0.9 % IV SOLN: 250.0000 mL | INTRAVENOUS | Status: DC | PRN

## 2019-12-23 MED ORDER — PANTOPRAZOLE SODIUM 40 MG PO TBEC
40.0000 mg | DELAYED_RELEASE_TABLET | Freq: Every day | ORAL | Status: DC
  Administered 2019-12-23 – 2019-12-24 (×2): 40 mg via ORAL
  Filled 2019-12-23 (×2): qty 1

## 2019-12-23 MED ORDER — MIDAZOLAM HCL 2 MG/2ML IJ SOLN
INTRAMUSCULAR | Status: DC | PRN
  Administered 2019-12-23 (×5): 1 mg via INTRAVENOUS

## 2019-12-23 MED ORDER — SODIUM CHLORIDE 0.9 % WEIGHT BASED INFUSION: 1.0000 mL/kg/h | INTRAVENOUS | Status: DC

## 2019-12-23 MED ORDER — FENTANYL CITRATE (PF) 100 MCG/2ML IJ SOLN
INTRAMUSCULAR | Status: AC
  Filled 2019-12-23: qty 2

## 2019-12-23 MED ORDER — NITROGLYCERIN 0.4 MG SL SUBL: 0.4000 mg | SUBLINGUAL_TABLET | SUBLINGUAL | Status: DC | PRN

## 2019-12-23 MED ORDER — ONDANSETRON HCL 4 MG/2ML IJ SOLN: 4.0000 mg | Freq: Four times a day (QID) | INTRAMUSCULAR | Status: DC | PRN

## 2019-12-23 MED ORDER — MORPHINE SULFATE (PF) 2 MG/ML IV SOLN
2.0000 mg | INTRAVENOUS | Status: DC | PRN
  Administered 2019-12-23 (×2): 2 mg via INTRAVENOUS
  Filled 2019-12-23 (×2): qty 1

## 2019-12-23 MED ORDER — BIVALIRUDIN TRIFLUOROACETATE 250 MG IV SOLR
INTRAVENOUS | Status: AC
  Filled 2019-12-23: qty 250

## 2019-12-23 MED ORDER — SODIUM CHLORIDE 0.9 % WEIGHT BASED INFUSION
3.0000 mL/kg/h | INTRAVENOUS | Status: DC
  Administered 2019-12-23: 3 mL/kg/h via INTRAVENOUS

## 2019-12-23 MED ORDER — LABETALOL HCL 5 MG/ML IV SOLN: 10.0000 mg | INTRAVENOUS | Status: AC | PRN

## 2019-12-23 MED ORDER — ACETAMINOPHEN 325 MG PO TABS: 650.0000 mg | ORAL_TABLET | ORAL | Status: DC | PRN

## 2019-12-23 MED ORDER — ASPIRIN 81 MG PO CHEW
81.0000 mg | CHEWABLE_TABLET | Freq: Every day | ORAL | Status: DC
  Administered 2019-12-24: 81 mg via ORAL
  Filled 2019-12-23: qty 1

## 2019-12-23 MED ORDER — BIVALIRUDIN BOLUS VIA INFUSION - CUPID
INTRAVENOUS | Status: DC | PRN
  Administered 2019-12-23: 84 mg via INTRAVENOUS

## 2019-12-23 MED ORDER — LIDOCAINE HCL (PF) 1 % IJ SOLN
INTRAMUSCULAR | Status: DC | PRN
  Administered 2019-12-23: 2 mL
  Administered 2019-12-23: 10 mL

## 2019-12-23 MED ORDER — ONDANSETRON HCL 4 MG/2ML IJ SOLN
INTRAMUSCULAR | Status: AC
  Administered 2019-12-23: 4 mg via INTRAVENOUS
  Filled 2019-12-23: qty 2

## 2019-12-23 SURGICAL SUPPLY — 23 items
BALLN SAPPHIRE 2.5X12 (BALLOONS) ×2
BALLN SAPPHIRE ~~LOC~~ 3.0X12 (BALLOONS) ×2 IMPLANT
BALLOON SAPPHIRE 2.5X12 (BALLOONS) ×1 IMPLANT
CATH INFINITI 4FR JL 4.0 (CATHETERS) ×2 IMPLANT
CATH INFINITI 5 FR JL3.5 (CATHETERS) ×2 IMPLANT
CATH INFINITI JR4 5F (CATHETERS) ×2 IMPLANT
CATH LAUNCHER 6FR EBU 3 (CATHETERS) ×2 IMPLANT
CATH LAUNCHER 6FR EBU3.5 (CATHETERS) ×2 IMPLANT
DEVICE RAD COMP TR BAND LRG (VASCULAR PRODUCTS) ×2 IMPLANT
GLIDESHEATH SLEND A-KIT 6F 22G (SHEATH) ×2 IMPLANT
GUIDEWIRE INQWIRE 1.5J.035X260 (WIRE) ×1 IMPLANT
INQWIRE 1.5J .035X260CM (WIRE) ×2
KIT ENCORE 26 ADVANTAGE (KITS) ×2 IMPLANT
KIT HEART LEFT (KITS) ×2 IMPLANT
PACK CARDIAC CATHETERIZATION (CUSTOM PROCEDURE TRAY) ×2 IMPLANT
SHEATH PINNACLE 6F 10CM (SHEATH) ×2 IMPLANT
SHEATH PROBE COVER 6X72 (BAG) ×2 IMPLANT
STENT RESOLUTE ONYX 2.75X15 (Permanent Stent) ×2 IMPLANT
TRANSDUCER W/STOPCOCK (MISCELLANEOUS) ×2 IMPLANT
TUBING CIL FLEX 10 FLL-RA (TUBING) ×2 IMPLANT
WIRE ASAHI PROWATER 180CM (WIRE) ×2 IMPLANT
WIRE EMERALD 3MM-J .035X150CM (WIRE) ×2 IMPLANT
WIRE HI TORQ VERSACORE-J 145CM (WIRE) ×2 IMPLANT

## 2019-12-23 NOTE — Progress Notes (Signed)
Called to [facility] for hematoma. Patient has large hematoma present above and below access site. Staff holding pressure. I applied manual pressure over entire site with both hands. Patient had a lot of pain, also described back pain. P.A. Vin contacted for retroperitoneal bleed evalualtion.   Site dressed with tegaderm. Area of hematoma marked with skin marker. Area aprox 5 inches by 7 inches. Vin assessing site.

## 2019-12-23 NOTE — Interval H&P Note (Signed)
Cath Lab Visit (complete for each Cath Lab visit)  Clinical Evaluation Leading to the Procedure:   ACS: Yes.    Non-ACS:    Anginal Classification: CCS III  Anti-ischemic medical therapy: Minimal Therapy (1 class of medications)  Non-Invasive Test Results: No non-invasive testing performed  Prior CABG: No previous CABG      History and Physical Interval Note:  12/23/2019 7:39 AM  [name]  has presented today for surgery, with the diagnosis of chest pain.  The various methods of treatment have been discussed with the patient and family. After consideration of risks, benefits and other options for treatment, the patient has consented to  Procedure(s): LEFT HEART CATH AND CORONARY ANGIOGRAPHY (N/A) as a surgical intervention.  The patient's history has been reviewed, patient examined, no change in status, stable for surgery.  I have reviewed the patient's chart and labs.  Questions were answered to the patient's satisfaction.     Belva Crome III

## 2019-12-23 NOTE — Progress Notes (Signed)
The had DES Placement to 1st dig. Called on floor for R groin hematoma. By the time I arrived to see the patient, her hematoma was stable (taken care by cath lab). Marking was done. She was having 5/10 groin pain. No back pain. Given IV morphine. Will get H @ H now then in 2 hours. Advised patient to let us know if worsening of pain. BP stable.

## 2019-12-23 NOTE — CV Procedure (Signed)
   Left heart cath with coronary angiography and left ventriculography via right radial approach using real-time vascular ultrasound for access.  Crossover to femoral approach for PCI of large diagonal using real-time vascular ultrasound for access and achieving single anterior wall puncture with Seldinger needle.  Codominant LAD and diagonal.  Diagonal with 99% mid body stenosis.  Left main widely patent  Circumflex widely patent  RCA widely patent  99% diagonal reduced to 0% with TIMI grade III flow using a 15 x 2.75 Onyx postdilated to 3.0 mm in diameter.  Procedure was prolonged and may complex by inability to advance even a 5 [place] guide catheter of the brachial artery causing switch over to femoral approach after heparin was administered for the radial portion of the procedure.  Extended time spent during the procedure because of these variables.

## 2019-12-23 NOTE — Progress Notes (Signed)
Sheath removed from right groin and noted hematoma distal to site. Pressure held and patient reassured. Complaining of pain with compression. Pressure held for 25 minutes. Area palpated and soft at 1230. Gauze and tegaderm applied. Patient instructed to report any changes noted in area of groin, warmth wetness pain swelling or back pain. Instructed to hold groin area when [facility] moved up and down, if needing to cough or sneeze. Call to have nurse check site after coughing, sneezing or movement .  Called to check groin site. Patient had hematoma and pressure had been held by nursing staff. Noted a hematoma under dressing. Dressing removed and pressure held, patient complained of pain. Paged provider for pain medication and to inform of patients rebleeding.  5 minute VS monitored. After 7-8 minutes patient complained of feeling nauseated and given zofran for symptoms. Blood pressure stable and heart rate unchanged. No s/s of vagal response noted. Patient not tolerating well and straining. Called cath lab for assist with holding. Sherlyn Lick arrived to assist. Robbie Lis PA paged and into assess patient.  VSS, sequential VS monitored and patient instructed to call for any change in assessment, specifically pain in back or any where else.

## 2019-12-24 DIAGNOSIS — I25119 Atherosclerotic heart disease of native coronary artery with unspecified angina pectoris: Secondary | ICD-10-CM

## 2019-12-24 DIAGNOSIS — Z79899 Other long term (current) drug therapy: Secondary | ICD-10-CM | POA: Diagnosis not present

## 2019-12-24 DIAGNOSIS — I25118 Atherosclerotic heart disease of native coronary artery with other forms of angina pectoris: Secondary | ICD-10-CM | POA: Diagnosis not present

## 2019-12-24 DIAGNOSIS — Z6841 Body Mass Index (BMI) 40.0 and over, adult: Secondary | ICD-10-CM | POA: Diagnosis not present

## 2019-12-24 DIAGNOSIS — K219 Gastro-esophageal reflux disease without esophagitis: Secondary | ICD-10-CM | POA: Diagnosis not present

## 2019-12-24 DIAGNOSIS — E039 Hypothyroidism, unspecified: Secondary | ICD-10-CM | POA: Diagnosis not present

## 2019-12-24 DIAGNOSIS — G894 Chronic pain syndrome: Secondary | ICD-10-CM | POA: Diagnosis not present

## 2019-12-24 DIAGNOSIS — I10 Essential (primary) hypertension: Secondary | ICD-10-CM | POA: Diagnosis not present

## 2019-12-24 DIAGNOSIS — Z87891 Personal history of nicotine dependence: Secondary | ICD-10-CM | POA: Diagnosis not present

## 2019-12-24 DIAGNOSIS — E119 Type 2 diabetes mellitus without complications: Secondary | ICD-10-CM | POA: Diagnosis not present

## 2019-12-24 DIAGNOSIS — S301XXA Contusion of abdominal wall, initial encounter: Secondary | ICD-10-CM

## 2019-12-24 DIAGNOSIS — G2581 Restless legs syndrome: Secondary | ICD-10-CM | POA: Diagnosis not present

## 2019-12-24 DIAGNOSIS — E785 Hyperlipidemia, unspecified: Secondary | ICD-10-CM | POA: Diagnosis not present

## 2019-12-24 LAB — CBC
HCT: 36.6 % (ref 36.0–46.0)
HCT: 35.1 % — ABNORMAL LOW (ref 36.0–46.0)
Hemoglobin: 11.9 g/dL — ABNORMAL LOW (ref 12.0–15.0)
Hemoglobin: 11.3 g/dL — ABNORMAL LOW (ref 12.0–15.0)
MCH: 31.1 pg (ref 26.0–34.0)
MCH: 30.7 pg (ref 26.0–34.0)
MCHC: 32.5 g/dL (ref 30.0–36.0)
MCHC: 32.2 g/dL (ref 30.0–36.0)
MCV: 95.6 fL (ref 80.0–100.0)
MCV: 95.4 fL (ref 80.0–100.0)
Platelets: 203 10*3/uL (ref 150–400)
Platelets: 200 10*3/uL (ref 150–400)
RBC: 3.83 MIL/uL — ABNORMAL LOW (ref 3.87–5.11)
RBC: 3.68 MIL/uL — ABNORMAL LOW (ref 3.87–5.11)
RDW: 13.2 % (ref 11.5–15.5)
RDW: 13.2 % (ref 11.5–15.5)
WBC: 8.4 10*3/uL (ref 4.0–10.5)
WBC: 8.4 10*3/uL (ref 4.0–10.5)
nRBC: 0 % (ref 0.0–0.2)
nRBC: 0 % (ref 0.0–0.2)

## 2019-12-24 LAB — BASIC METABOLIC PANEL
Anion gap: 8 (ref 5–15)
BUN: 12 mg/dL (ref 8–23)
CO2: 22 mmol/L (ref 22–32)
Calcium: 8.3 mg/dL — ABNORMAL LOW (ref 8.9–10.3)
Chloride: 109 mmol/L (ref 98–111)
Creatinine, Ser: 0.68 mg/dL (ref 0.44–1.00)
GFR calc Af Amer: >60 mL/min (ref >60)
GFR calc non Af Amer: >60 mL/min (ref >60)
Glucose, Bld: 132 mg/dL — ABNORMAL HIGH (ref 70–99)
Potassium: 3.9 mmol/L (ref 3.5–5.1)
Sodium: 139 mmol/L (ref 135–145)

## 2019-12-24 MED ORDER — METOPROLOL SUCCINATE ER 25 MG PO TB24: 25.0000 mg | ORAL_TABLET | Freq: Every day | ORAL | 6 refills | Status: DC

## 2019-12-24 MED ORDER — ATORVASTATIN CALCIUM 80 MG PO TABS: 80.0000 mg | ORAL_TABLET | Freq: Every day | ORAL | 6 refills | Status: DC

## 2019-12-24 MED ORDER — ANGIOPLASTY BOOK
Freq: Once | Status: AC
  Filled 2019-12-24: qty 1

## 2019-12-24 MED ORDER — METOPROLOL SUCCINATE ER 25 MG PO TB24: 12.5000 mg | ORAL_TABLET | Freq: Every day | ORAL | 3 refills | Status: DC

## 2019-12-24 MED ORDER — TICAGRELOR 90 MG PO TABS: 90.0000 mg | ORAL_TABLET | Freq: Two times a day (BID) | ORAL | 6 refills | Status: DC

## 2019-12-24 MED ORDER — SODIUM CHLORIDE 0.9 % IV BOLUS
250.0000 mL | Freq: Once | INTRAVENOUS | Status: AC
  Administered 2019-12-24: 250 mL via INTRAVENOUS

## 2019-12-24 NOTE — Progress Notes (Signed)
Spoke to Cardiology NP Sharolyn Douglas regarding pt dizziness and low BPs in the 90's when sitting/standing since starting beta blocker today. Patient expressed desire to go home regardless - Metoprolol was decreased from 25 mg to 12.5, 250 cc bolus ordered to improve BP - will walk pt after bolus and check BP and if tolerated well, orders to d/c patient.

## 2019-12-24 NOTE — Progress Notes (Signed)
Received pt.with hematoma rt.groin which was marked & when assesed  Noted hematoma is soft &  Stable.Will continue to monitor pt.

## 2019-12-24 NOTE — Progress Notes (Addendum)
[id number] - [id number]  Provided patient education on PCI/DES x1 to [place]. Stressed use of Brilinta/Asa. Reviewed self care s/p cath/PCI. Reviewed s/s to report to MD, activity limitations, wound care, and precautions. Provided information on low Na and heart healthy diet for guidance with the risk factors of HTN, Hyperlipidemia. Referral to [facility] CRP2 completed. While education patient she felt light headed. Pt placed supine, BP was 117/71 and feels better. Got patient some water, fluids encouraged. Primary RN notified. Reviewed use of NTG. Pt verbalized understanding. Will f/u.   Lesly Rubenstein MS, ACSM-EP-C, CCRP 9:53 AM

## 2019-12-24 NOTE — Progress Notes (Signed)
Assesed pt.'s rt.groin & no changed from last time assesed.Will continue to monitor pt.

## 2019-12-24 NOTE — Discharge Summary (Signed)
Discharge Summary    Patient ID: [name] MRN: [id number]; DOB: [birth year]  Admit date: 12/23/2019 Discharge date: 12/24/2019  Primary Care Provider: Jani Gravel, MD  Primary Cardiologist: Sinclair Grooms, MD  Primary Electrophysiologist:  None   Discharge Diagnoses    Principal Problem:   Unstable angina [facility])  **S/p PCI/DES to the first diagonal branch this admission.  Active Problems:   Hypertension   Obesity, morbid, BMI 40.0-49.9 (HCC)   Groin hematoma   Hyperlipidemia   Diagnostic Studies/Procedures    Cardiac Catheterization and Percutaneous Coronary Intervention 7.2.2021  Left Anterior Descending  Vessel is small.  Mid LAD lesion is 50% stenosed.  First Diagonal Branch  Vessel is small in size.  1st Diag lesion is 99% stenosed.      **The first diagonal was successfully treated using a 2.75 x 30mm Onyx drug-eluting stent, postdilated to 3.0 mm.  Left Circumflex  Vessel is small.  Second Obtuse Marginal Branch  Vessel is small in size.  Right Coronary Artery  Prox RCA lesion is 25% stenosed.   Left Ventricle The left ventricular systolic function is normal. LV end diastolic pressure is mildly elevated. The left ventricular ejection fraction is 50-55% by visual estimate. There are LV function abnormalities due to segmental dysfunction.  _____________   History of Present Illness     [name] is a 69 y.o. female with a history of hypertension, hyperlipidemia, obesity, diet-controlled diabetes, and family history of premature coronary artery disease.  She was recently evaluated in cardiology clinic secondary to 3 episodes of substernal chest pressure associated with nausea and diaphoresis, occurring at rest, lasting 20 minutes, and resolving spontaneously.  Given severe symptoms and multiple risk factors, decision was made to pursue diagnostic catheterization.  Hospital Course     Consultants: None  Patient presented to the [facility]  cardiac catheterization laboratory on December 23, 2019 and underwent diagnostic catheterization revealing severe, 99% stenosis in the first diagonal.  The LAD had nonobstructive 50% mid stenosis while the RCA had a 25% proximal stenosis.  EF was 50-55%.  The diagonal was felt to be the culprit for her symptoms and an initial attempt was made in exchanging catheters via the radial artery access however due to spasm and difficulty with advancing the 6 French catheter, the interventional team moved to a femoral approach.  She was then successfully treated using a 2.75 x 15 mm Onyx drug-eluting stent which was subsequently postdilated to 3.0 mm.  Post procedure, she developed a right groin hematoma associated with severe pain, which was stabilized with manual pressure.  This morning, hemoglobin hematocrit dropped slightly from 13.2/39.7 on July 2 to 11.9/36.6 today.  Groin pain has improved significantly and the site is notable for a soft hematoma with mild ecchymosis.  F/u CBC this afternoon remains stable (11.3/35.1).  She has had some lightheadedness w/ mild orthostasis this AM.  She has been provided a 249ml saline bolus (pressures high 90's) and I have reduced her Toprol XL to 12.5mg  daily (received 25mg  this AM).  She is eager for discharge and will be discharged home this afternoon.  Did the patient have an acute coronary syndrome (MI, NSTEMI, STEMI, etc) this admission?:  No                               Did the patient have a percutaneous coronary intervention (stent / angioplasty)?:  Yes.  Cath/PCI Registry Performance & Quality Measures: 1. Aspirin prescribed? - Yes 2. ADP Receptor Inhibitor (Plavix/Clopidogrel, Brilinta/Ticagrelor or Effient/Prasugrel) prescribed (includes medically managed patients)? - Yes 3. High Intensity Statin (Lipitor 40-80mg  or Crestor 20-40mg ) prescribed? - Yes 4. For EF <40%, was ACEI/ARB prescribed? - Not Applicable (EF >/= 58%) 5. For EF <40%, Aldosterone Antagonist  (Spironolactone or Eplerenone) prescribed? - Not Applicable (EF >/= 52%) 6. Cardiac Rehab Phase II ordered? - Yes   _____________  Discharge Vitals Blood pressure (!) 108/54, pulse 78, temperature 98.1 F (36.7 C), temperature source Oral, resp. rate 17, height 5\' 2"  (1.575 m), weight 115.8 kg, SpO2 99 %.  Filed Weights   12/23/19 0539 12/24/19 0346  Weight: 112 kg 115.8 kg    Labs & Radiologic Studies    CBC Recent Labs    12/24/19 0506 12/24/19 1223  WBC 8.4 8.4  HGB 11.9* 11.3*  HCT 36.6 35.1*  MCV 95.6 95.4  PLT 203 778   Basic Metabolic Panel Recent Labs    12/24/19 0506  NA 139  K 3.9  CL 109  CO2 22  GLUCOSE 132*  BUN 12  CREATININE 0.68  CALCIUM 8.3*   High Sensitivity Troponin:   Recent Labs  Lab 12/18/19 1049 12/18/19 1405  TROPONINIHS 4 12   _____________  DG Chest 2 View  Result Date: 12/18/2019 CLINICAL DATA:  Chest pain. EXAM: CHEST - 2 VIEW COMPARISON:  September 20, 2015 FINDINGS: Vascular crowding in the medial right lung base, stable. The heart, hila, mediastinum, lungs, and pleura are normal. No pneumothorax. IMPRESSION: No active cardiopulmonary disease. Electronically Signed   By: Dorise Bullion III M.D   On: 12/18/2019 10:58   Disposition   Pt is being discharged home today in good condition.  Follow-up Plans & Appointments     Follow-up Information    Belva Crome, MD Follow up on 01/10/2020.   Specialty: Cardiology Why: 11:40 AM Contact information: [address] [place] [address] [phone]              Discharge Instructions    Amb Referral to Cardiac Rehabilitation   Complete by: As directed    Diagnosis:  Coronary Stents PTCA     After initial evaluation and assessments completed: Virtual Based Care may be provided alone or in conjunction with Phase 2 Cardiac Rehab based on patient barriers.: Yes      Discharge Medications   Allergies as of 12/24/2019   No Known Allergies       Medication List    STOP taking these medications   ibuprofen 200 MG tablet Commonly known as: ADVIL     TAKE these medications   aspirin EC 81 MG tablet Take 1 tablet (81 mg total) by mouth daily. Swallow whole.   atorvastatin 80 MG tablet Commonly known as: LIPITOR Take 1 tablet (80 mg total) by mouth daily. Start taking on: December 25, 2019   HYDROcodone-acetaminophen 10-325 MG tablet Commonly known as: NORCO Take 1 tablet by mouth 2 (two) times daily as needed for pain.   losartan 50 MG tablet Commonly known as: COZAAR Take 50 mg by mouth daily.   metoprolol succinate 25 MG 24 hr tablet Commonly known as: TOPROL-XL Take 0.5 tablets (12.5 mg total) by mouth daily. Start taking on: December 25, 2019   nitroGLYCERIN 0.4 MG SL tablet Commonly known as: NITROSTAT Place 1 tablet (0.4 mg total) under the tongue every 5 (five) minutes as needed for chest pain.  omeprazole 40 MG capsule Commonly known as: PRILOSEC Take 40 mg by mouth daily as needed Jerrye Bushy).   ticagrelor 90 MG Tabs tablet Commonly known as: BRILINTA Take 1 tablet (90 mg total) by mouth 2 (two) times daily.   Vitamin D (Ergocalciferol) 1.25 MG (50000 UNIT) Caps capsule Commonly known as: DRISDOL Take 50,000 Units by mouth once a week.         Outstanding Labs/Studies   F/u lipids/lft's in ~ 12 wks.  Duration of Discharge Encounter   Greater than 30 minutes including physician time.  Signed, Murray Hodgkins, NP 12/24/2019, 2:25 PM

## 2019-12-24 NOTE — Discharge Instructions (Signed)
**  PLEASE REMEMBER TO BRING ALL OF YOUR MEDICATIONS TO EACH OF YOUR FOLLOW-UP OFFICE VISITS.  NO HEAVY LIFTING OR SEXUAL ACTIVITY X 7 DAYS. NO DRIVING X 3-5 DAYS. NO SOAKING BATHS, HOT TUBS, POOLS, ETC., X 7 DAYS.  Groin Site Care Refer to this sheet in the next few weeks. These instructions provide you with information on caring for yourself after your procedure. Your caregiver may also give you more specific instructions. Your treatment has been planned according to current medical practices, but problems sometimes occur. Call your caregiver if you have any problems or questions after your procedure. HOME CARE INSTRUCTIONS  You may shower 24 hours after the procedure. Remove the bandage (dressing) and gently wash the site with plain soap and water. Gently pat the site dry.   Do not apply powder or lotion to the site.   Do not sit in a bathtub, swimming pool, or whirlpool for 5 to 7 days.   No bending, squatting, or lifting anything over 10 pounds (4.5 kg) as directed by your caregiver.   Inspect the site at least twice daily.   Do not drive home if you are discharged the same day of the procedure. Have someone else drive you.   What to expect:  Any bruising will usually fade within 1 to 2 weeks.   Blood that collects in the tissue (hematoma) may be painful to the touch. It should usually decrease in size and tenderness within 1 to 2 weeks.  SEEK IMMEDIATE MEDICAL CARE IF:  You have unusual pain at the groin site or down the affected leg.   You have redness, warmth, swelling, or pain at the groin site.   You have drainage (other than a small amount of blood on the dressing).   You have chills.   You have a fever or persistent symptoms for more than 72 hours.   You have a fever and your symptoms suddenly get worse.   Your leg becomes pale, cool, tingly, or numb.  You have heavy bleeding from the site. Hold pressure on the site. . _____________     10 Habits of Highly  Healthy People  [place] wants to help you get well and stay well.  Live a longer, healthier life by practicing healthy habits every day.  1.  Visit your primary care provider regularly. 2.  Make time for family and friends.  Healthy relationships are important. 3.  Take medications as directed by your provider. 4.  Maintain a healthy weight and a trim waistline. 5.  Eat healthy meals and snacks, rich in fruits, vegetables, whole grains, and lean proteins. 6.  Get moving every day - aim for 150 minutes of moderate physical activity each week. 7.  Don't smoke. 8.  Avoid alcohol or drink in moderation. 9.  Manage stress through meditation or mindful relaxation. 10.  Get seven to nine hours of quality sleep each night.  Want more information on healthy habits?  To learn more about these and other healthy habits, visit [email or web address]. _____________     

## 2019-12-24 NOTE — Progress Notes (Signed)
Assessed pt's rt.groin again & no increase from the markings & feels soft but with slight pain on the site.Oxy IR 5mg  po given. Marland Kitchen

## 2019-12-24 NOTE — Progress Notes (Addendum)
12:08 - 12:22  Spoke with primary RN and re-attempted to ambulate pt. Pt was 97/62 while sitting. While attempting to check BP while standing pt voices feeling dizzy. Pt placed supine in bed with BP of 109/78. Report given to primary RN.  Lesly Rubenstein MS, ACSM-EP-C, CCRP 12:22 PM

## 2019-12-24 NOTE — Progress Notes (Signed)
Progress Note  Patient Name: [name] Date of Encounter: 12/24/2019  Primary Cardiologist: Belva Crome III, MD  Subjective   No chest pain or shortness of breath.  Has not been up yet today.  Right groin hematoma pain has improved significantly, no back or lower abdominal pain.  Inpatient Medications    Scheduled Meds: . aspirin  81 mg Oral Daily  . atorvastatin  80 mg Oral Daily  . losartan  50 mg Oral Daily  . metoprolol succinate  25 mg Oral Daily  . pantoprazole  40 mg Oral Daily  . sodium chloride flush  3 mL Intravenous Q12H  . ticagrelor  90 mg Oral BID   Continuous Infusions: . sodium chloride     PRN Meds: sodium chloride, acetaminophen, morphine injection, nitroGLYCERIN, ondansetron (ZOFRAN) IV, oxyCODONE, sodium chloride flush   Vital Signs    Vitals:   12/24/19 0300 12/24/19 0346 12/24/19 0437 12/24/19 0613  BP: 103/65  116/64   Pulse:   78   Resp: 15 20 19 17   Temp:   98.1 F (36.7 C)   TempSrc:   Oral   SpO2:   99%   Weight:  115.8 kg    Height:        Intake/Output Summary (Last 24 hours) at 12/24/2019 0808 Last data filed at 12/23/2019 2200 Gross per 24 hour  Intake 505.56 ml  Output --  Net 505.56 ml   Filed Weights   12/23/19 0539 12/24/19 0346  Weight: 112 kg 115.8 kg    Telemetry    Sinus rhythm.  Personally reviewed.  ECG    An ECG dated 12/24/2019 was personally reviewed today and demonstrated:  Sinus rhythm with decreased R wave progression, possible old inferior infarct pattern, nonspecific ST-T changes.  Physical Exam   GEN: No acute distress.   Neck: No JVD. Cardiac: RRR, no murmur or gallop.  Respiratory: Nonlabored. Clear to auscultation bilaterally. GI: Soft, nontender, bowel sounds present. MS:  Right groin hematoma soft, mild ecchymosis, no extension beyond prior demarcations. Neuro:  Nonfocal. Psych: Alert and oriented x 3. Normal affect.  Labs    Chemistry Recent Labs  Lab 12/18/19 1049  12/24/19 0506  NA 139 139  K 3.8 3.9  CL 105 109  CO2 25 22  GLUCOSE 179* 132*  BUN 12 12  CREATININE 0.69 0.68  CALCIUM 8.8* 8.3*  GFRNONAA >60 >60  GFRAA >60 >60  ANIONGAP 9 8     Hematology Recent Labs  Lab 12/18/19 1049 12/18/19 1049 12/23/19 1409 12/23/19 1552 12/24/19 0506  WBC 4.3  --   --   --  8.4  RBC 4.76  --   --   --  3.83*  HGB 14.6   < > 13.9 13.2 11.9*  HCT 45.5   < > 42.7 39.7 36.6  MCV 95.6  --   --   --  95.6  MCH 30.7  --   --   --  31.1  MCHC 32.1  --   --   --  32.5  RDW 12.8  --   --   --  13.2  PLT 188  --   --   --  203   < > = values in this interval not displayed.    Cardiac Enzymes Recent Labs  Lab 12/18/19 1049 12/18/19 1405  TROPONINIHS 4 12    Radiology    CARDIAC CATHETERIZATION  Result Date: 12/23/2019  2-week history of progressive new onset  angina.  99% large first diagonal treated with 2.75 x 15 Onyx postdilated to 3.0 mm and 0% stenosis.  Left main is widely patent  LAD contains mid eccentric 50% stenosis.  Circumflex gives 1 large tortuous obtuse marginal and is free of disease.  Dominant with luminal irregularities in the mid body.  No significant obstruction.  Very mild mid anterior wall hypokinesis.  EF 50 to 55% which is mildly reduced for female.  LVEDP is 19 mmHg. RECOMMENDATIONS:  Dual antiplatelet therapy for at least 6 months.  Initially aspirin and Brilinta.  After 4 to 6 weeks Brilinta could be decreased in intensity to clopidogrel plus aspirin.  High intensity statin therapy added.  Low-dose beta-blocker therapy added.  Overnight observation since we have both radial and femoral access during the procedure.  Patient Profile     69 y.o. female with a history of hypertension, hyperlipidemia, type 2 diabetes mellitus and angina symptoms, referred for cardiac catheterization on July 2.  Assessment & Plan    1.  CAD status post cardiac catheterization on July 2 with documentation of 99% large first diagonal  managed with DES intervention by Dr. Tamala Julian, moderate mid LAD disease managed medically, LVEF 50 to 55%.  On aspirin, Brilinta, and Lipitor.  2.  Right groin hematoma status post procedure, stable with manual compression.  Hemoglobin 11.9, preprocedure 13.2.  3.  Essential hypertension, recent systolics ranging 326-712.  Currently on losartan and Toprol-XL.  Reviewed chart, discussed with patient and with nursing this morning.  She feels better in general, we will get her up and progress in typical post procedure fashion.  Vital signs have been stable.  Check follow-up hemoglobin midday and reevaluate right groin hematoma.  If stable can consider discharge home later today, otherwise will reassess plan.  Signed, Rozann Lesches, MD  12/24/2019, 8:08 AM

## 2019-12-24 NOTE — TOC Initial Note (Signed)
Transition of Care [facility]) - Initial/Assessment Note    Patient Details  Name: [name] MRN: [id number] Date of Birth: [birth year]  Transition of Care [facility]) CM/SW Contact:    Bartholomew Crews, RN Phone Number: [phone] 12/24/2019, 4:18 PM  Clinical Narrative:                  Notified by nursing that patient had questions about discharge medications, Brinlinta. NCM called [facility] in [place] on [address], [place] advised that prescription has been filled. Patient informed of telephone call to pharmacy. Patient stated that she had been told that there may be an insurance problem. Advised that prior authorization may be required by MD. Patient provided free trial card. No further TOC needs identified.   Expected Discharge Plan: Home/Self Care Barriers to Discharge: No Barriers Identified   Patient Goals and CMS Choice     Choice offered to / list presented to : NA  Expected Discharge Plan and Services Expected Discharge Plan: Home/Self Care   Discharge Planning Services: CM Consult Post Acute Care Choice: NA   Expected Discharge Date: 12/24/19               DME Arranged: N/A DME Agency: NA       HH Arranged: NA HH Agency: NA        Prior Living Arrangements/Services     Patient language and need for interpreter reviewed:: Yes        Need for Family Participation in Patient Care: No (Comment)     Criminal Activity/Legal Involvement Pertinent to Current Situation/Hospitalization: No - Comment as needed  Activities of Daily Living Home Assistive Devices/Equipment: None ADL Screening (condition at time of admission) Patient's cognitive ability adequate to safely complete daily activities?: Yes Is the patient deaf or have difficulty hearing?: No Does the patient have difficulty seeing, even when wearing glasses/contacts?: No Does the patient have difficulty concentrating, remembering, or making decisions?: No Patient able to express need for assistance with  ADLs?: Yes Does the patient have difficulty dressing or bathing?: No Independently performs ADLs?: Yes (appropriate for developmental age) Does the patient have difficulty walking or climbing stairs?: Yes Weakness of Legs: Both Weakness of Arms/Hands: None  Permission Sought/Granted                  Emotional Assessment Appearance:: Appears stated age Attitude/Demeanor/Rapport: Engaged Affect (typically observed): Accepting Orientation: : Oriented to Self, Oriented to  Time, Oriented to Place, Oriented to Situation Alcohol / Substance Use: Not Applicable Psych Involvement: No (comment)  Admission diagnosis:  Angina pectoris ([place]) [I20.9] Patient Active Problem List   Diagnosis Date Noted  . Groin hematoma 12/24/2019  . Angina pectoris ([place]) 12/23/2019  . Long-term current use of opiate analgesic 07/09/2017  . Chronic pain syndrome 07/09/2017  . Lumbar spondylosis 07/09/2017  . 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 Memory disorder 10/30/2015  . 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 Dyspnea on exertion 09/20/2015  . Unstable angina ([place]) 09/20/2015  . URI, acute 09/20/2015  . Obesity, morbid, BMI 40.0-49.9 ([place]) 06/02/2014  . Hypertension   . Hyperlipidemia   . Diabetes ([place]) 12/02/2013  . Depression 05/27/2013  . Osteoporosis 05/02/2013  . GERD (gastroesophageal reflux disease) 05/25/2012  . Hypothyroidism 05/25/2012  . Restless legs syndrome 05/25/2012  . Vitamin D deficiency 05/25/2012   PCP:  Jani Gravel, MD Pharmacy:   [facility] [place], [address]  [place] [address] Phone: [phone] Fax: [phone]  [id number] [address], [address] AT [facility] [address] [place] [address] Phone: [phone] Fax: [phone]     Social Determinants of Health ([place]) Interventions    Readmission Risk Interventions No flowsheet data found.

## 2019-12-27 ENCOUNTER — Other Ambulatory Visit: Payer: Self-pay | Admitting: *Deleted

## 2019-12-27 ENCOUNTER — Telehealth: Payer: Self-pay | Admitting: Interventional Cardiology

## 2019-12-27 NOTE — Telephone Encounter (Signed)
Pt c/o medication issue:  1. Name of Medication: ticagrelor (BRILINTA) 90 MG TABS tablet  2. How are you currently taking this medication (dosage and times per day)? Pt has not taken it   3. Are you having a reaction (difficulty breathing--STAT)? Cost issue  4. What is your medication issue? Pt said she has had a hard time finding a pharmacy that had this medication in stock. The pharmacy she uses will not have it in stock until July 29th, and even once it is in stock it will cost her $70 which is outside her budget.   She said her brother takes Plavix and that is much more affordable and she wanted to know if Dr. Tamala Julian would change her to Plavix instead of Brilinta. Pt uses the   [place] [place] [address] [place] [place]

## 2019-12-27 NOTE — Telephone Encounter (Signed)
She will need Pantoprazole with Plavix.

## 2019-12-27 NOTE — Telephone Encounter (Signed)
Are you willing to refill omeprazole?

## 2019-12-27 NOTE — Telephone Encounter (Signed)
Left message for patient to call back  

## 2019-12-27 NOTE — Telephone Encounter (Signed)
Called and spoke to the patient. She states that she cannot find a pharmacy that has [place] until 7/29. She states that even once they get it that it will cost her $70/mo which is outside of her budget. She is asking if she can take plavix instead.   Patient with recent DES to first diagonal branch on 12/23/19 with recommendations to take dual antiplatelet therapy x 6 months, initially with ASA and Brilinta and then could potentially switch Brilinta to plavix after 4-6 weeks.   The patient states that she has not had brilinta since she was discharged on 7/3. I have left a 4 week supply of brilinta samples as well as a 30 day card for patient to pick up from our office. Explained the importance of adherence. Patient verbalized understanding. Patient has an appointment with Dr. Tamala Julian on 7/20. She can discuss switching brilinta to plavix at that time.   Patient also states that her PCP is no longer available and is asking if Dr. Tamala Julian will be willing to refill her omeprazole. Made her aware that I will forward the request.

## 2019-12-27 NOTE — Telephone Encounter (Signed)
If she has not had Brilinta since 12/24/2019, she should take to Brilinta as soon as she picks up the samples then 1 tablet twice daily.  She should use the samples and we will switch over to Plavix at completion of the samples.  We should send in a prescription for Plavix/clopidogrel 75 mg tablets and asked her to take 4 tablets (300 mg) on the first dose (with last dose of Brilinta) then 75 mg/day thereafter.

## 2019-12-28 ENCOUNTER — Telehealth: Payer: Self-pay | Admitting: Nurse Practitioner

## 2019-12-28 NOTE — Telephone Encounter (Signed)
   Patient called to report that right groin ecchymosis has moved distally some from previous mark, which was made prior to discharge in the hospital.  The area remains soft and is relatively nontender.  I offered reassurance that bruising and extension of bruising following groin hematoma is expected.  She should contact us if she has any worsening of pain or increased swelling/hardness of the right groin.  Caller verbalized understanding and was grateful for the call back.  Murray Hodgkins, NP 12/28/2019, 7:39 PM

## 2019-12-29 MED ORDER — PANTOPRAZOLE SODIUM 40 MG PO TBEC
40.0000 mg | DELAYED_RELEASE_TABLET | Freq: Every day | ORAL | 3 refills | Status: DC
Start: 2019-12-29 — End: 2020-12-21

## 2019-12-29 NOTE — Telephone Encounter (Signed)
Spoke with pt and reviewed recommendations per Dr. Tamala Julian.  Pt was able to pick up samples.  Pt has appt on 7/20 and we will plan to send in Plavix prescription at that time to give her time to use up samples.  Advised I will go ahead and send in prescription for Pantoprazole but she could finish up Omeprazole while still on Brilinta.  Pt verbalized understanding and was appreciative for call.

## 2019-12-30 ENCOUNTER — Telehealth: Payer: Self-pay | Admitting: Interventional Cardiology

## 2019-12-30 MED ORDER — CLOPIDOGREL BISULFATE 75 MG PO TABS
75.0000 mg | ORAL_TABLET | Freq: Every day | ORAL | 3 refills | Status: DC
Start: 2019-12-30 — End: 2020-04-04

## 2019-12-30 NOTE — Telephone Encounter (Signed)
Patient following up.

## 2019-12-30 NOTE — Telephone Encounter (Signed)
New Message  Pt c/o medication issue:  1. Name of Medication: ticagrelor (BRILINTA) 90 MG TABS tablet  2. How are you currently taking this medication (dosage and times per day)? As directed  3. Are you having a reaction (difficulty breathing--STAT)? Difficulty breathing and SOB  4. What is your medication issue? Patient is having SOB, states it woke her up out of her slep. Transfer to triage

## 2019-12-30 NOTE — Telephone Encounter (Signed)
She cannot just stop Brilinta.  She needs to go ahead and take a dose that she has missed this morning and take clopidogrel 300 mg at the same time.  No more Brilinta after today's dose and she should continue to take clopidogrel 75 mg/day starting tomorrow.

## 2019-12-30 NOTE — Telephone Encounter (Signed)
Spoke with [name] and reviewed recommendations per Dr. Smith.  [name] verbalized understanding and was in agreement with this plan.  

## 2019-12-30 NOTE — Telephone Encounter (Signed)
Call transferred to triage. Patient states that at about 3am this morning she woke up and was extremely short of breath. She got up and sat on the side of her bed. She states that SOB lasted for a couple hours. She denies any chest pain/pressure or dizziness. She states that SOB has eased up since then. She states that she started taking Brilinta on Wednesday. Most recent dose was last night before she went to bed. She has not taken her morning dose yet. Will send to Dr. Tamala Julian for further recommendations.

## 2019-12-30 NOTE — Telephone Encounter (Signed)
Do you want to go ahead and transition her to Plavix as stated in your previous message?  She was originally suppose to use up the samples and then change over but she's having symptoms with the Brilinta now.

## 2020-01-02 ENCOUNTER — Telehealth: Payer: Self-pay | Admitting: Interventional Cardiology

## 2020-01-02 NOTE — Telephone Encounter (Signed)
Try to hold on to current regimen for a week. She will probably feel better.

## 2020-01-02 NOTE — Telephone Encounter (Signed)
Pt has been having intermittent dizziness and nausea since being released from the hospital.  Has not checked vitals.  Felt good yesterday evening but woke up this morning slightly dizzy and nauseous.  Pt does have a cuff but was not at home to check her BP.  Advised pt to monitor BP and HR QD, 2-3 hours after meds, and call with those reading towards the end of the week so we can see if low BP is causing dizziness and weakness.  Pt verbalized understanding and was in agreement with plan.

## 2020-01-02 NOTE — Telephone Encounter (Signed)
    Pt c/o medication issue:  1. Name of Medication: Heart medicines   2. How are you currently taking this medication (dosage and times per day)?   3. Are you having a reaction (difficulty breathing--STAT)?   4. What is your medication issue? Pt said she's been feeling weak and nauseated, she feel is like its one of her heart meds burt wasn't sure which one

## 2020-01-04 ENCOUNTER — Telehealth (HOSPITAL_COMMUNITY): Payer: Self-pay

## 2020-01-04 NOTE — Telephone Encounter (Signed)
Pt insurance is active and benefits verified through [employer]. Co-pay $15.00, DED $0.00/$0.00 met, out of pocket $3,400.00/$30.00 met, co-insurance 0%. No pre-authorization required. Janay/HTA, 01/04/20 @ 10:05AM, OQH#[id number]  Will contact patient to see if she is interested in the Cardiac Rehab Program. If interested, patient will need to complete follow up appt. Once completed, patient will be contacted for scheduling upon review by the RN Navigator.

## 2020-01-04 NOTE — Telephone Encounter (Signed)
Attempted to call patient in regards to Cardiac Rehab - LM on VM 

## 2020-01-06 ENCOUNTER — Other Ambulatory Visit: Payer: Self-pay | Admitting: Internal Medicine

## 2020-01-06 DIAGNOSIS — Z1231 Encounter for screening mammogram for malignant neoplasm of breast: Secondary | ICD-10-CM

## 2020-01-06 NOTE — Telephone Encounter (Signed)
Spoke with pt and she states BPs have been fine.  She gave me one BP of 143/77.  States she feels symptoms may be more anxiety.  Feels fine when she is around other people but once she is alone she develops SOB, dizziness and nausea.  Advised pt to continue monitoring BP over the weekend and keep appt with Dr. Tamala Julian on Tuesday and we will see how she is doing.

## 2020-01-09 NOTE — Progress Notes (Signed)
Cardiology Office Note:    Date:  01/10/2020   ID:  [name], DOB 11/24/1950, MRN [id number]  PCP:  Jani Gravel, MD  Cardiologist:  Sinclair Grooms, MD   Referring MD: Daphene Calamity,*   Chief Complaint  Patient presents with  . Coronary Artery Disease  . Advice Only    Ticagrelor intolerance    History of Present Illness:    [name] is a 69 y.o. female with a hx of hypertension, obesity, hyperlipidemia, DM II, with recent unstable angina treated with DE stent circumflex with good result.  Intolerant of Brilinta due to dyspnea.  Interventional procedure complicated by small radial artery requiring switch over to femoral approach which was complicated by late femoral bleed.  The femoral artery bleed/hematoma has significantly improved.  Dyspnea and side effects related to ticagrelor have completely resolved since switch to Plavix.  Complaint now is that each morning and also at night when she returns home from work she feels anxious.  She is worried about a lot of things.  She is frightened that she may have a heart attack while at home.  She does sleep well but as soon as she awakens she begins to feel nervous.  She wants me to do something about this.  There are times when it feels her heart is racing.  This seems to go away after she gets up, gets ready for work, and gets off to her job.  She feels fine when she is around other people.  She has not had recurrent angina nor needed sublingual nitroglycerin since discharge from the hospital.  Past Medical History:  Diagnosis Date  . 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 Chronic pain syndrome 07/09/2017  . Coronary artery disease   . Depression   . Diabetes ([place]) 12/02/2013   Last Assessment & Plan:  Formatting of this note might be different from the original. Will try to see if metformin XR is better tolerated for patient.  WIll start with 500 mg QHS.  Continue amaryl  for now.  i suspect she may be able to decrease or discontinue at follow-up  Lab Results  Component Value Date   HGBA1C 5.4 03/01/2014   : Last Assessment & Plan:  Formatting of this note may be different  . Dyspnea on exertion 09/20/2015  . Gastro-esophageal reflux   . GERD (gastroesophageal reflux disease) 05/25/2012  . Hyperlipidemia   . Hypertension   . Hypothyroidism 05/25/2012   Last Assessment & Plan:  States was started after a partial thyroidectomy but does not recall any abnormal lab value and no symptoms.  Discussed as is on low dose, would be reasonable to discontinue and recheck in 6-8 weeks which she would like to do. Last Assessment & Plan:  States was started after a partial thyroidectomy but does not recall any abnormal lab value and no symptoms.  Discussed as   . Lumbar spondylosis 07/09/2017  . Memory disorder 10/30/2015  . Obesity, morbid, BMI 40.0-49.9 ([name]) 06/02/2014  . Osteoporosis 05/02/2013   Overview:  DEXA 11/14 DEXA 11/14  . Restless legs syndrome 05/25/2012   Last Assessment & Plan:  patient reports using clonazepam about once weekly .  Discussed nonpharmacologic tips for treating RLS.  States other medicatiosn she had treid had not been effective (does not remember name) Will refill for now.  Advised if use increases, will readdress in future  . RLS (restless legs syndrome)   .  Thyroid disease   . Unstable angina ([place]) 09/20/2015  . URI, acute 09/20/2015  . Vitamin B 12 deficiency   . Vitamin D deficiency 05/25/2012    Past Surgical History:  Procedure Laterality Date  . ABDOMINAL HYSTERECTOMY    . APPENDECTOMY    . CATARACT EXTRACTION    . CHOLECYSTECTOMY    . CORONARY STENT INTERVENTION N/A 12/23/2019   Procedure: CORONARY STENT INTERVENTION;  Surgeon: Belva Crome, MD;  Location: [facility];  Service: Cardiovascular;  Laterality: N/A;  . JOINT REPLACEMENT     2 total knee replacement  . LASIK    . LEFT HEART CATH AND CORONARY ANGIOGRAPHY N/A 12/23/2019    Procedure: LEFT HEART CATH AND CORONARY ANGIOGRAPHY;  Surgeon: Belva Crome, MD;  Location: [facility];  Service: Cardiovascular;  Laterality: N/A;  . THYROIDECTOMY    . TONSILLECTOMY      Current Medications: Current Meds  Medication Sig  . aspirin EC 81 MG tablet Take 1 tablet (81 mg total) by mouth daily. Swallow whole.  Marland Kitchen atorvastatin (LIPITOR) 80 MG tablet Take 1 tablet (80 mg total) by mouth daily.  . clopidogrel (PLAVIX) 75 MG tablet Take 1 tablet (75 mg total) by mouth daily.  Marland Kitchen HYDROcodone-acetaminophen (NORCO) 10-325 MG tablet Take 1 tablet by mouth 2 (two) times daily as needed for pain.  Marland Kitchen losartan (COZAAR) 50 MG tablet Take 50 mg by mouth daily.   . metoprolol succinate (TOPROL-XL) 25 MG 24 hr tablet Take 0.5 tablets (12.5 mg total) by mouth daily.  . nitroGLYCERIN (NITROSTAT) 0.4 MG SL tablet Place 1 tablet (0.4 mg total) under the tongue every 5 (five) minutes as needed for chest pain.  . pantoprazole (PROTONIX) 40 MG tablet Take 1 tablet (40 mg total) by mouth daily.  . Vitamin D, Ergocalciferol, (DRISDOL) 1.25 MG (50000 UNIT) CAPS capsule Take 50,000 Units by mouth once a week.     Allergies:   Patient has no known allergies.   Social History   Socioeconomic History  . Marital status: Divorced    Spouse name: Not on file  . Number of children: Not on file  . Years of education: Not on file  . Highest education level: Not on file  Occupational History  . Not on file  Tobacco Use  . Smoking status: Former Smoker    Quit date: 02/17/1987    Years since quitting: 32.9  . Smokeless tobacco: Never Used  Vaping Use  . Vaping Use: Never used  Substance and Sexual Activity  . Alcohol use: No  . Drug use: No  . Sexual activity: Not on file  Other Topics Concern  . Not on file  Social History Narrative   Lives at home alone   Right-handed   Social Determinants of Health   Financial Resource Strain:   . Difficulty of Paying Living Expenses:   Food  Insecurity:   . Worried About Charity fundraiser in the Last Year:   . Arboriculturist in the Last Year:   Transportation Needs:   . Film/video editor (Medical):   Marland Kitchen Lack of Transportation (Non-Medical):   Physical Activity:   . Days of Exercise per Week:   . Minutes of Exercise per Session:   Stress:   . Feeling of Stress :   Social Connections:   . Frequency of Communication with Friends and Family:   . Frequency of Social Gatherings with Friends and Family:   .  Attends Religious Services:   . Active Member of Clubs or Organizations:   . Attends Archivist Meetings:   Marland Kitchen Marital Status:      Family History: The patient's family history includes Cancer in her father and mother; Diabetes in her brother, brother, and sister; Heart disease in her brother; Hypertension in her mother. There is no history of Breast cancer.  ROS:   Please see the history of present illness.    Some anxiety.  No cath site issues.  Sleeping well.  Concerned about longevity and whether she can have another heart attack.  All other systems reviewed and are negative.  EKGs/Labs/Other Studies Reviewed:    The following studies were reviewed today: Cardiac catheterization performed 12/23/2019: Post PCI result: Intervention    EKG:  EKG not repeated  Recent Labs: 12/24/2019: BUN 12; Creatinine, Ser 0.68; Hemoglobin 11.3; Platelets 200; Potassium 3.9; Sodium 139  Recent Lipid Panel No results found for: CHOL, TRIG, HDL, CHOLHDL, VLDL, LDLCALC, LDLDIRECT  Physical Exam:    VS:  BP (!) 146/74   Pulse 71   Ht 5\' 1"  (1.549 m)   Wt 257 lb 3.2 oz (116.7 kg)   SpO2 95%   BMI 48.60 kg/m     Wt Readings from Last 3 Encounters:  01/10/20 257 lb 3.2 oz (116.7 kg)  12/24/19 255 lb 4.8 oz (115.8 kg)  12/21/19 257 lb (116.6 kg)     GEN: Morbidly obese. No acute distress HEENT: Normal NECK: No JVD. LYMPHATICS: No lymphadenopathy CARDIAC:  RRR without murmur, gallop, or edema. VASCULAR:   Normal Pulses. No bruits. RESPIRATORY:  Clear to auscultation without rales, wheezing or rhonchi  ABDOMEN: Soft, non-tender, non-distended, No pulsatile mass, MUSCULOSKELETAL: No deformity  SKIN: Warm and dry NEUROLOGIC:  Alert and oriented x 3 PSYCHIATRIC:  Normal affect   ASSESSMENT:    1. Coronary artery disease involving native coronary artery of native heart with unstable angina pectoris ([place])   2. Hyperlipidemia LDL goal <70   3. Essential hypertension   4. Obesity, morbid, BMI 40.0-49.9 ([place])   5. Medication intolerance   6. Educated about COVID-19 virus infection    PLAN:    In order of problems listed above:  1. Secondary prevention discussed.  Needs to have a lipid panel done in mid August.  Clinical follow-up with me in 2 months.  Cardiac rehab phase 2 is highly recommended to sort out if her complaints at home are related to arrhythmia, anxiety, or other abnormalities. 2. Target LDL less than 70.  She is on high dose statin therapy which will be continued.  Lab tests will be done in mid to late August. 3. Repeat blood pressure today is 128/68 mmHg.  No change in therapy is recommended.  Continue losartan and very low-dose beta-blocker therapy. 4. Decrease caloric intake, aerobic activity, are recommended. 5. She had been unable to tolerate ticagrelor and was switched to clopidogrel with excellent result and resolution of dyspnea. 6. COVID-19 vaccination has been received.  Overall education and awareness concerning primary/secondary risk prevention was discussed in detail: LDL less than 70, hemoglobin A1c less than 7, blood pressure target less than 130/80 mmHg, >150 minutes of moderate aerobic activity per week, avoidance of smoking, weight control (via diet and exercise), and continued surveillance/management of/for obstructive sleep apnea.  Strongly recommended participation in phase 2 cardiac rehab.   Medication Adjustments/Labs and Tests Ordered: Current medicines  are reviewed at length with the patient today.  Concerns regarding medicines are  outlined above.  No orders of the defined types were placed in this encounter.  No orders of the defined types were placed in this encounter.   Patient Instructions  Medication Instructions:  Your provider recommends that you continue on your current medications as directed. Please refer to the Current Medication list given to you today.   *If you need a refill on your cardiac medications before your next appointment, please call your pharmacy*  Lab Work: You have a [facility] lab appointment 8/13. You may come ANY TIME between 7:30AM and 4:30PM. If you have labs (blood work) drawn today and your tests are completely normal, you will receive your results only by: Marland Kitchen MyChart Message (if you have MyChart) OR . A paper copy in the mail If you have any lab test that is abnormal or we need to change your treatment, we will call you to review the results.  Follow-Up: You have an appointment with Dr. Tamala Julian on 03/16/2020 at [place] will be in touch!    Signed, Sinclair Grooms, MD  01/10/2020 1:01 PM    [place]

## 2020-01-10 ENCOUNTER — Ambulatory Visit: Payer: PPO | Admitting: Interventional Cardiology

## 2020-01-10 ENCOUNTER — Telehealth (HOSPITAL_COMMUNITY): Payer: Self-pay | Admitting: *Deleted

## 2020-01-10 ENCOUNTER — Other Ambulatory Visit: Payer: Self-pay

## 2020-01-10 ENCOUNTER — Encounter: Payer: Self-pay | Admitting: Interventional Cardiology

## 2020-01-10 VITALS — BP 146/74 | HR 71 | Ht 61.0 in | Wt 257.2 lb

## 2020-01-10 DIAGNOSIS — Z789 Other specified health status: Secondary | ICD-10-CM | POA: Diagnosis not present

## 2020-01-10 DIAGNOSIS — Z7189 Other specified counseling: Secondary | ICD-10-CM

## 2020-01-10 DIAGNOSIS — I25110 Atherosclerotic heart disease of native coronary artery with unstable angina pectoris: Secondary | ICD-10-CM

## 2020-01-10 DIAGNOSIS — I10 Essential (primary) hypertension: Secondary | ICD-10-CM

## 2020-01-10 DIAGNOSIS — E785 Hyperlipidemia, unspecified: Secondary | ICD-10-CM

## 2020-01-10 NOTE — Telephone Encounter (Signed)
-----   Message from Theodoro Parma, RN sent at 01/10/2020  2:03 PM EDT ----- Regarding: [place]! This patient saw Dr. Tamala Julian today and is eager to start CR. We told her you guys should be in touch to schedule. Let us know if we need to do anything! I hope you are well, Valetta Fuller

## 2020-01-10 NOTE — Patient Instructions (Signed)
Medication Instructions:  Your provider recommends that you continue on your current medications as directed. Please refer to the Current Medication list given to you today.   *If you need a refill on your cardiac medications before your next appointment, please call your pharmacy*  Lab Work: You have a [facility] lab appointment 8/13. You may come ANY TIME between 7:30AM and 4:30PM. If you have labs (blood work) drawn today and your tests are completely normal, you will receive your results only by: Marland Kitchen MyChart Message (if you have MyChart) OR . A paper copy in the mail If you have any lab test that is abnormal or we need to change your treatment, we will call you to review the results.  Follow-Up: You have an appointment with Dr. Tamala Julian on 03/16/2020 at [facility] [place] will be in touch!

## 2020-01-13 ENCOUNTER — Ambulatory Visit
Admission: RE | Admit: 2020-01-13 | Discharge: 2020-01-13 | Disposition: A | Discharge status: Home / Self Care | Payer: PPO | Source: Ambulatory Visit | Attending: Internal Medicine | Admitting: Internal Medicine

## 2020-01-13 ENCOUNTER — Ambulatory Visit: Payer: PPO

## 2020-01-13 ENCOUNTER — Other Ambulatory Visit: Payer: Self-pay

## 2020-01-13 DIAGNOSIS — I2510 Atherosclerotic heart disease of native coronary artery without angina pectoris: Secondary | ICD-10-CM | POA: Diagnosis not present

## 2020-01-13 DIAGNOSIS — Z1231 Encounter for screening mammogram for malignant neoplasm of breast: Secondary | ICD-10-CM | POA: Diagnosis not present

## 2020-01-13 DIAGNOSIS — Z955 Presence of coronary angioplasty implant and graft: Secondary | ICD-10-CM | POA: Diagnosis not present

## 2020-01-13 DIAGNOSIS — F418 Other specified anxiety disorders: Secondary | ICD-10-CM | POA: Diagnosis not present

## 2020-01-23 ENCOUNTER — Telehealth (HOSPITAL_COMMUNITY): Payer: Self-pay

## 2020-01-23 NOTE — Telephone Encounter (Signed)
Called and spoke with pt in regards to CR, pt stated she is not interested at this time due to a possible upcoming shoulder surgery.  Closed referral

## 2020-01-27 DIAGNOSIS — Z5181 Encounter for therapeutic drug level monitoring: Secondary | ICD-10-CM | POA: Diagnosis not present

## 2020-01-27 DIAGNOSIS — D6869 Other thrombophilia: Secondary | ICD-10-CM | POA: Diagnosis not present

## 2020-01-27 DIAGNOSIS — M25511 Pain in right shoulder: Secondary | ICD-10-CM | POA: Diagnosis not present

## 2020-01-27 DIAGNOSIS — Z79891 Long term (current) use of opiate analgesic: Secondary | ICD-10-CM | POA: Diagnosis not present

## 2020-01-27 DIAGNOSIS — Z79899 Other long term (current) drug therapy: Secondary | ICD-10-CM | POA: Diagnosis not present

## 2020-01-27 DIAGNOSIS — M5136 Other intervertebral disc degeneration, lumbar region: Secondary | ICD-10-CM | POA: Diagnosis not present

## 2020-01-27 DIAGNOSIS — G894 Chronic pain syndrome: Secondary | ICD-10-CM | POA: Diagnosis not present

## 2020-01-27 DIAGNOSIS — I2510 Atherosclerotic heart disease of native coronary artery without angina pectoris: Secondary | ICD-10-CM | POA: Diagnosis not present

## 2020-02-03 ENCOUNTER — Other Ambulatory Visit: Payer: Self-pay | Admitting: *Deleted

## 2020-02-03 ENCOUNTER — Other Ambulatory Visit: Payer: PPO | Admitting: *Deleted

## 2020-02-03 ENCOUNTER — Other Ambulatory Visit: Payer: Self-pay

## 2020-02-03 DIAGNOSIS — M25511 Pain in right shoulder: Secondary | ICD-10-CM | POA: Diagnosis not present

## 2020-02-03 DIAGNOSIS — E785 Hyperlipidemia, unspecified: Secondary | ICD-10-CM | POA: Diagnosis not present

## 2020-02-03 DIAGNOSIS — M67813 Other specified disorders of tendon, right shoulder: Secondary | ICD-10-CM | POA: Diagnosis not present

## 2020-02-03 LAB — HEPATIC FUNCTION PANEL
ALT: 14 IU/L (ref 0–32)
AST: 14 IU/L (ref 0–40)
Albumin: 3.8 g/dL (ref 3.7–4.7)
Alkaline Phosphatase: 118 IU/L (ref 48–121)
Bilirubin Total: 0.8 mg/dL (ref 0.0–1.2)
Bilirubin, Direct: 0.26 mg/dL (ref 0.00–0.40)
Total Protein: 6.1 g/dL (ref 6.0–8.5)

## 2020-02-03 LAB — LIPID PANEL
Chol/HDL Ratio: 2 ratio (ref 0.0–4.4)
Cholesterol, Total: 133 mg/dL (ref 100–199)
HDL: 67 mg/dL (ref >39)
LDL Chol Calc (NIH): 51 mg/dL (ref 0–99)
Triglycerides: 75 mg/dL (ref 0–149)
VLDL Cholesterol Cal: 15 mg/dL (ref 5–40)

## 2020-03-16 ENCOUNTER — Ambulatory Visit: Payer: PPO | Admitting: Interventional Cardiology

## 2020-03-16 DIAGNOSIS — M7541 Impingement syndrome of right shoulder: Secondary | ICD-10-CM | POA: Diagnosis not present

## 2020-03-16 DIAGNOSIS — M7542 Impingement syndrome of left shoulder: Secondary | ICD-10-CM | POA: Diagnosis not present

## 2020-03-16 DIAGNOSIS — M67813 Other specified disorders of tendon, right shoulder: Secondary | ICD-10-CM | POA: Diagnosis not present

## 2020-03-21 ENCOUNTER — Emergency Department (HOSPITAL_BASED_OUTPATIENT_CLINIC_OR_DEPARTMENT_OTHER): Payer: PPO

## 2020-03-21 ENCOUNTER — Other Ambulatory Visit: Payer: Self-pay

## 2020-03-21 ENCOUNTER — Encounter: Payer: Self-pay | Admitting: Physical Therapy

## 2020-03-21 ENCOUNTER — Emergency Department (HOSPITAL_BASED_OUTPATIENT_CLINIC_OR_DEPARTMENT_OTHER)
Admission: EM | Admit: 2020-03-21 | Discharge: 2020-03-22 | Disposition: A | Discharge status: Home / Self Care | Payer: PPO | Source: Home / Self Care | Attending: Emergency Medicine | Admitting: Emergency Medicine

## 2020-03-21 ENCOUNTER — Ambulatory Visit: Payer: PPO | Attending: Orthopedic Surgery | Admitting: Physical Therapy

## 2020-03-21 ENCOUNTER — Encounter (HOSPITAL_BASED_OUTPATIENT_CLINIC_OR_DEPARTMENT_OTHER): Payer: Self-pay | Admitting: Emergency Medicine

## 2020-03-21 DIAGNOSIS — R252 Cramp and spasm: Secondary | ICD-10-CM

## 2020-03-21 DIAGNOSIS — S0083XA Contusion of other part of head, initial encounter: Secondary | ICD-10-CM

## 2020-03-21 DIAGNOSIS — M25512 Pain in left shoulder: Secondary | ICD-10-CM | POA: Diagnosis not present

## 2020-03-21 DIAGNOSIS — M25511 Pain in right shoulder: Secondary | ICD-10-CM

## 2020-03-21 DIAGNOSIS — E039 Hypothyroidism, unspecified: Secondary | ICD-10-CM | POA: Insufficient documentation

## 2020-03-21 DIAGNOSIS — W01198A Fall on same level from slipping, tripping and stumbling with subsequent striking against other object, initial encounter: Secondary | ICD-10-CM | POA: Insufficient documentation

## 2020-03-21 DIAGNOSIS — M7981 Nontraumatic hematoma of soft tissue: Secondary | ICD-10-CM | POA: Diagnosis not present

## 2020-03-21 DIAGNOSIS — S42291A Other displaced fracture of upper end of right humerus, initial encounter for closed fracture: Secondary | ICD-10-CM

## 2020-03-21 DIAGNOSIS — M79631 Pain in right forearm: Secondary | ICD-10-CM | POA: Diagnosis not present

## 2020-03-21 DIAGNOSIS — Z79899 Other long term (current) drug therapy: Secondary | ICD-10-CM | POA: Insufficient documentation

## 2020-03-21 DIAGNOSIS — M25611 Stiffness of right shoulder, not elsewhere classified: Secondary | ICD-10-CM

## 2020-03-21 DIAGNOSIS — Z7982 Long term (current) use of aspirin: Secondary | ICD-10-CM | POA: Insufficient documentation

## 2020-03-21 DIAGNOSIS — S0081XA Abrasion of other part of head, initial encounter: Secondary | ICD-10-CM | POA: Insufficient documentation

## 2020-03-21 DIAGNOSIS — M25612 Stiffness of left shoulder, not elsewhere classified: Secondary | ICD-10-CM

## 2020-03-21 DIAGNOSIS — S42211A Unspecified displaced fracture of surgical neck of right humerus, initial encounter for closed fracture: Secondary | ICD-10-CM | POA: Diagnosis not present

## 2020-03-21 DIAGNOSIS — W108XXA Fall (on) (from) other stairs and steps, initial encounter: Secondary | ICD-10-CM

## 2020-03-21 DIAGNOSIS — S40011A Contusion of right shoulder, initial encounter: Secondary | ICD-10-CM | POA: Diagnosis not present

## 2020-03-21 DIAGNOSIS — Z23 Encounter for immunization: Secondary | ICD-10-CM | POA: Insufficient documentation

## 2020-03-21 DIAGNOSIS — E119 Type 2 diabetes mellitus without complications: Secondary | ICD-10-CM | POA: Insufficient documentation

## 2020-03-21 DIAGNOSIS — I2510 Atherosclerotic heart disease of native coronary artery without angina pectoris: Secondary | ICD-10-CM | POA: Insufficient documentation

## 2020-03-21 DIAGNOSIS — I10 Essential (primary) hypertension: Secondary | ICD-10-CM | POA: Insufficient documentation

## 2020-03-21 MED ORDER — ONDANSETRON HCL 4 MG/2ML IJ SOLN
4.0000 mg | Freq: Once | INTRAMUSCULAR | Status: AC
  Administered 2020-03-21: 4 mg via INTRAVENOUS
  Filled 2020-03-21: qty 2

## 2020-03-21 MED ORDER — TETANUS-DIPHTH-ACELL PERTUSSIS 5-2.5-18.5 LF-MCG/0.5 IM SUSP
0.5000 mL | Freq: Once | INTRAMUSCULAR | Status: AC
  Administered 2020-03-21: 0.5 mL via INTRAMUSCULAR
  Filled 2020-03-21: qty 0.5

## 2020-03-21 MED ORDER — ONDANSETRON 4 MG PO TBDP
4.0000 mg | ORAL_TABLET | Freq: Once | ORAL | Status: AC
  Administered 2020-03-21: 4 mg via ORAL
  Filled 2020-03-21: qty 1

## 2020-03-21 MED ORDER — OXYCODONE-ACETAMINOPHEN 5-325 MG PO TABS
1.0000 | ORAL_TABLET | ORAL | Status: DC | PRN
  Administered 2020-03-21: 1 via ORAL
  Filled 2020-03-21 (×2): qty 1

## 2020-03-21 MED ORDER — HYDROMORPHONE HCL 1 MG/ML IJ SOLN
1.0000 mg | Freq: Once | INTRAMUSCULAR | Status: AC
  Administered 2020-03-21: 1 mg via INTRAVENOUS
  Filled 2020-03-21: qty 1

## 2020-03-21 NOTE — ED Notes (Signed)
Patient transported to CT 

## 2020-03-21 NOTE — Patient Instructions (Signed)
Access Code: [email or web address] URL: [email or web address] Date: 03/21/2020 Prepared by: Lum Babe  Exercises Supine Shoulder Flexion AAROM with Hands Clasped - 2 x daily - 7 x weekly - 1 sets - 10 reps - 3 hold Seated Shoulder Shrug Circles AROM Backward - 2 x daily - 7 x weekly - 1 sets - 10 reps - 3 hold Standing Bilateral Shoulder Internal Rotation AAROM with Dowel - 2 x daily - 7 x weekly - 1 sets - 10 reps - 3 hold Standing Shoulder Extension ROM with Dowel - 2 x daily - 7 x weekly - 1 sets - 10 reps - 3 hold Seated Shoulder Flexion Towel Slide at Table Top Full Range of Motion - 2 x daily - 7 x weekly - 1 sets - 10 reps - 3 hold

## 2020-03-21 NOTE — ED Triage Notes (Signed)
Pt was walking up the steps.  Her foot got stuck and she fell up the steps.  Pt hit her left temple.  Pt fell against her right arm.  Pt c/o pain to right elbow, right shoulder and right side of her neck.

## 2020-03-21 NOTE — Therapy (Signed)
[facility]. [place], [place], [address] Phone: [phone]   Fax:  [phone]  Physical Therapy Evaluation  Patient Details  Name: [name] MRN: [id number] Date of Birth: 1950-09-22 Referring Provider (PT): Victorino December   Encounter Date: 03/21/2020   PT End of Session - 03/21/20 1038    Visit Number 1    Date for PT Re-Evaluation 05/21/20    PT Start Time 1010    PT Stop Time 1045    PT Time Calculation (min) 35 min    Activity Tolerance Patient tolerated treatment well    Behavior During Therapy [facility] for tasks assessed/performed           Past Medical History:  Diagnosis Date  . 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 Chronic pain syndrome 07/09/2017  . Coronary artery disease   . Depression   . Diabetes ([place]) 12/02/2013   Last Assessment & Plan:  Formatting of this note might be different from the original. Will try to see if metformin XR is better tolerated for patient.  WIll start with 500 mg QHS.  Continue amaryl for now.  i suspect she may be able to decrease or discontinue at follow-up  Lab Results  Component Value Date   HGBA1C 5.4 03/01/2014   : Last Assessment & Plan:  Formatting of this note may be different  . Dyspnea on exertion 09/20/2015  . Gastro-esophageal reflux   . GERD (gastroesophageal reflux disease) 05/25/2012  . Hyperlipidemia   . Hypertension   . Hypothyroidism 05/25/2012   Last Assessment & Plan:  States was started after a partial thyroidectomy but does not recall any abnormal lab value and no symptoms.  Discussed as is on low dose, would be reasonable to discontinue and recheck in 6-8 weeks which she would like to do. Last Assessment & Plan:  States was started after a partial thyroidectomy but does not recall any abnormal lab value and no symptoms.  Discussed as   . Lumbar spondylosis 07/09/2017  . Memory disorder 10/30/2015  . Obesity, morbid, BMI  40.0-49.9 ([place]) 06/02/2014  . Osteoporosis 05/02/2013   Overview:  DEXA 11/14 DEXA 11/14  . Restless legs syndrome 05/25/2012   Last Assessment & Plan:  patient reports using clonazepam about once weekly .  Discussed nonpharmacologic tips for treating RLS.  States other medicatiosn she had treid had not been effective (does not remember name) Will refill for now.  Advised if use increases, will readdress in future  . RLS (restless legs syndrome)   . Thyroid disease   . Unstable angina ([place]) 09/20/2015  . URI, acute 09/20/2015  . Vitamin B 12 deficiency   . Vitamin D deficiency 05/25/2012    Past Surgical History:  Procedure Laterality Date  . ABDOMINAL HYSTERECTOMY    . APPENDECTOMY    . CATARACT EXTRACTION    . CHOLECYSTECTOMY    . CORONARY STENT INTERVENTION N/A 12/23/2019   Procedure: CORONARY STENT INTERVENTION;  Surgeon: Belva Crome, MD;  Location: [place] [facility];  Service: Cardiovascular;  Laterality: N/A;  . JOINT REPLACEMENT     2 total knee replacement  . LASIK    . LEFT HEART CATH AND CORONARY ANGIOGRAPHY N/A 12/23/2019   Procedure: LEFT HEART CATH AND CORONARY ANGIOGRAPHY;  Surgeon: Belva Crome, MD;  Location: [place] [facility];  Service: Cardiovascular;  Laterality: N/A;  . THYROIDECTOMY    .  TONSILLECTOMY      There were no vitals filed for this visit.    Subjective Assessment - 03/21/20 1010    Subjective Patient reports that she has had some shoulder pain for a few months, she reports that she has had cortisone injections without any relief, she i s right handed,  She reports that the pain started in the right arm but is in both now, reports right worse thant he left.  X-rays negatvie    Limitations Lifting;Writing;House hold activities    Patient Stated Goals have less pain, dress and do hair without difficulty    Currently in Pain? Yes    Pain Score 1     Pain Location Shoulder    Pain Orientation Right;Left    Pain Descriptors / Indicators Aching;Sharp     Pain Radiating Towards pain down the front of the bicep area    Pain Onset More than a month ago    Pain Frequency Constant    Aggravating Factors  seat belt, hari and dressing pain up to 9/10    Pain Relieving Factors at rest pain can be 1/10    Effect of Pain on Daily Activities difficulty dresing, doing hair, putting on seatbelt              [facility] PT Assessment - 03/21/20 0001      Assessment   Medical Diagnosis bilateral shoulder pain (impingement)    Referring Provider (PT) Victorino December    Onset Date/Surgical Date 02/19/20    Hand Dominance Right    Prior Therapy no      Precautions   Precautions None      Balance Screen   Has the patient fallen in the past 6 months No    Has the patient had a decrease in activity level because of a fear of falling?  No    Is the patient reluctant to leave their home because of a fear of falling?  No      Home Environment   Additional Comments does housework, cooks and cleans      Prior Function   Level of Independence Independent    Vocation Part time employment    Vocation Requirements mostly on the computer    Leisure no exercise      Posture/Postural Control   Posture Comments fwd head, rounded shoulders      ROM / Strength   AROM / PROM / Strength AROM;PROM;Strength      AROM   AROM Assessment Site Shoulder    Right/Left Shoulder Right;Left    Right Shoulder Flexion 90 Degrees    Right Shoulder ABduction 60 Degrees    Right Shoulder Internal Rotation 15 Degrees    Right Shoulder External Rotation 30 Degrees    Left Shoulder Flexion 95 Degrees    Left Shoulder ABduction 70 Degrees    Left Shoulder Internal Rotation 35 Degrees    Left Shoulder External Rotation 50 Degrees      PROM   PROM Assessment Site Shoulder    Right/Left Shoulder Right;Left    Right Shoulder Flexion 100 Degrees    Right Shoulder ABduction 65 Degrees    Right Shoulder Internal Rotation 20 Degrees    Right Shoulder External Rotation 40  Degrees    Left Shoulder Flexion 105 Degrees    Left Shoulder ABduction 88 Degrees    Left Shoulder Internal Rotation 40 Degrees    Left Shoulder External Rotation 60 Degrees      Strength  Overall Strength Comments 3+/5 with pain for all motions pain mostly in the right biceps area      Palpation   Palpation comment she has a lot of spasms and tightness in the upper traps, very tender in the bilateral biceps, R>L      Special Tests   Other special tests + impingment bilaterally                      Objective measurements completed on examination: See above findings.                 PT Short Term Goals - 03/21/20 1044      PT SHORT TERM GOAL #1   Title independent with initial HEP    Time 2    Period Weeks    Status New             PT Long Term Goals - 03/21/20 1045      PT LONG TERM GOAL #1   Title understand posture and body mechanics    Time 8    Period Weeks    Status New      PT LONG TERM GOAL #2   Title decrease pain overall 50%    Time 8    Period Weeks      PT LONG TERM GOAL #3   Title increase AROM of shoulder flexion to 130 degrees    Time 8    Period Weeks    Status New      PT LONG TERM GOAL #4   Title report able to do seatbelt wihtout pain >5/10    Time 8    Period Weeks    Status New                  Plan - 03/21/20 1041    Clinical Impression Statement Patient is reporting a few months of shoulder pain, she is unsure of a cause, since she has bilateral shoulder pain, reports that she has had injections but they did not help, x-rays were negative, she is right handed.  Right is worse than the left.  She has very limited ROM, she has significant spasms in the upper traps.  Reports difficulty dressing, doing hair and putting on her seat belt    Stability/Clinical Decision Making Stable/Uncomplicated    Clinical Decision Making Low    Rehab Potential Good    PT Frequency 2x / week    PT Duration 8 weeks     PT Treatment/Interventions ADLs/Self Care Home Management;Cryotherapy;Electrical Stimulation;Iontophoresis 4mg /ml Dexamethasone;Moist Heat;Ultrasound;Therapeutic exercise;Neuromuscular re-education;Patient/family education;Manual techniques    PT Next Visit Plan slowly work on ROM and function    Consulted and Agree with Plan of Care Patient           Patient will benefit from skilled therapeutic intervention in order to improve the following deficits and impairments:  Decreased range of motion, Increased muscle spasms, Impaired UE functional use, Decreased endurance, Decreased activity tolerance, Pain, Improper body mechanics, Postural dysfunction, Decreased strength  Visit Diagnosis: Acute pain of right shoulder - Plan: PT plan of care cert/re-cert  Acute pain of left shoulder - Plan: PT plan of care cert/re-cert  Stiffness of right shoulder, not elsewhere classified - Plan: PT plan of care cert/re-cert  Stiffness of left shoulder, not elsewhere classified - Plan: PT plan of care cert/re-cert  Cramp and spasm - Plan: PT plan of care cert/re-cert     Problem List Patient Active Problem  List   Diagnosis Date Noted  . Groin hematoma 12/24/2019  . Angina pectoris ([place]) 12/23/2019  . Long-term current use of opiate analgesic 07/09/2017  . Chronic pain syndrome 07/09/2017  . Lumbar spondylosis 07/09/2017  . 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 Memory disorder 10/30/2015  . 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 Dyspnea on exertion 09/20/2015  . Unstable angina ([place]) 09/20/2015  . URI, acute 09/20/2015  . Obesity, morbid, BMI 40.0-49.9 ([place]) 06/02/2014  . Hypertension   . Hyperlipidemia   . Diabetes ([place]) 12/02/2013  . Depression 05/27/2013  . Osteoporosis 05/02/2013  . GERD (gastroesophageal reflux disease) 05/25/2012  . Hypothyroidism 05/25/2012  . Restless legs syndrome 05/25/2012  . Vitamin D deficiency 05/25/2012    Sumner Boast., PT 03/21/2020, 10:49 AM  [facility]. [place], [place], [address] Phone: [phone]   Fax:  [phone]  Name: [name] MRN: [id number] Date of Birth: August 24, 1950

## 2020-03-21 NOTE — ED Provider Notes (Signed)
[facility] Provider Note: Georgena Spurling, MD, FACEP  CSN: [id number] MRN: [id number] ARRIVAL: 03/21/20 at 93 ROOM: [place]  Fall   HISTORY OF PRESENT ILLNESS  03/21/20 11:01 PM [name] is a 69 y.o. female who fell forward while walking up some steps at home just prior to arrival.  She fell onto her right shoulder.  She is complaining of severe (10 out of 10) pain in her right shoulder.  The pain is sharp and radiates to her right neck.  It is worse with attempted movement of the right shoulder.  She is also having pain in her right elbow, worse with movement of the right elbow.  She has no numbness or weakness in her forearm or hand on the right.  She also struck the left side of her forehead that has minimal pain at that site.  She did not lose consciousness.  She has not been vomiting.   Past Medical History:  Diagnosis Date  . 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 Chronic pain syndrome 07/09/2017  . Coronary artery disease   . Depression   . Diabetes ([place]) 12/02/2013   Last Assessment & Plan:  Formatting of this note might be different from the original. Will try to see if metformin XR is better tolerated for patient.  WIll start with 500 mg QHS.  Continue amaryl for now.  i suspect she may be able to decrease or discontinue at follow-up  Lab Results  Component Value Date   HGBA1C 5.4 03/01/2014   : Last Assessment & Plan:  Formatting of this note may be different  . Dyspnea on exertion 09/20/2015  . Gastro-esophageal reflux   . GERD (gastroesophageal reflux disease) 05/25/2012  . Hyperlipidemia   . Hypertension   . Hypothyroidism 05/25/2012   Last Assessment & Plan:  States was started after a partial thyroidectomy but does not recall any abnormal lab value and no symptoms.  Discussed as is on low dose, would be reasonable to discontinue and recheck in 6-8 weeks which she would like to do. Last Assessment &  Plan:  States was started after a partial thyroidectomy but does not recall any abnormal lab value and no symptoms.  Discussed as   . Lumbar spondylosis 07/09/2017  . Memory disorder 10/30/2015  . Obesity, morbid, BMI 40.0-49.9 ([place]) 06/02/2014  . Osteoporosis 05/02/2013   Overview:  DEXA 11/14 DEXA 11/14  . Restless legs syndrome 05/25/2012   Last Assessment & Plan:  patient reports using clonazepam about once weekly .  Discussed nonpharmacologic tips for treating RLS.  States other medicatiosn she had treid had not been effective (does not remember name) Will refill for now.  Advised if use increases, will readdress in future  . RLS (restless legs syndrome)   . Thyroid disease   . Unstable angina ([place]) 09/20/2015  . URI, acute 09/20/2015  . Vitamin B 12 deficiency   . Vitamin D deficiency 05/25/2012    Past Surgical History:  Procedure Laterality Date  . ABDOMINAL HYSTERECTOMY    . APPENDECTOMY    . CATARACT EXTRACTION    . CHOLECYSTECTOMY    . CORONARY STENT INTERVENTION N/A 12/23/2019   Procedure: CORONARY STENT INTERVENTION;  Surgeon: Belva Crome, MD;  Location: [facility];  Service: Cardiovascular;  Laterality: N/A;  . JOINT REPLACEMENT     2 total knee replacement  . LASIK    .  LEFT HEART CATH AND CORONARY ANGIOGRAPHY N/A 12/23/2019   Procedure: LEFT HEART CATH AND CORONARY ANGIOGRAPHY;  Surgeon: Belva Crome, MD;  Location: [facility];  Service: Cardiovascular;  Laterality: N/A;  . THYROIDECTOMY    . TONSILLECTOMY      Family History  Problem Relation Age of Onset  . Cancer Mother   . Hypertension Mother   . Cancer Father   . Diabetes Sister   . Heart disease Brother   . Diabetes Brother   . Diabetes Brother   . Breast cancer Neg Hx     Social History   Tobacco Use  . Smoking status: Former Smoker    Quit date: 02/17/1987    Years since quitting: 33.1  . Smokeless tobacco: Never Used  Vaping Use  . Vaping Use: Never used  Substance Use Topics  .  Alcohol use: No  . Drug use: No    Prior to Admission medications   Medication Sig Start Date End Date Taking? Authorizing Provider  aspirin EC 81 MG tablet Take 1 tablet (81 mg total) by mouth daily. Swallow whole. 12/21/19   Belva Crome, MD  atorvastatin (LIPITOR) 80 MG tablet Take 1 tablet (80 mg total) by mouth daily. 12/25/19   Theora Gianotti, NP  citalopram (CELEXA) 10 MG tablet Take 10 mg by mouth daily. 03/05/20   [provider]  clonazePAM (KLONOPIN) 1 MG tablet Take 1 mg by mouth at bedtime as needed. 03/05/20   [provider]  clopidogrel (PLAVIX) 75 MG tablet Take 1 tablet (75 mg total) by mouth daily. 12/30/19   Belva Crome, MD  losartan (COZAAR) 50 MG tablet Take 50 mg by mouth daily.     [provider]  metoprolol succinate (TOPROL-XL) 25 MG 24 hr tablet Take 0.5 tablets (12.5 mg total) by mouth daily. 12/25/19   Theora Gianotti, NP  nitroGLYCERIN (NITROSTAT) 0.4 MG SL tablet Place 1 tablet (0.4 mg total) under the tongue every 5 (five) minutes as needed for chest pain. 12/21/19   Belva Crome, MD  ondansetron (ZOFRAN ODT) 8 MG disintegrating tablet Take 1 tablet (8 mg total) by mouth every 8 (eight) hours as needed for nausea or vomiting. 03/22/20   Harbour Nordmeyer, Jenny Reichmann, MD  oxyCODONE-acetaminophen (PERCOCET) 10-325 MG tablet Take 1 tablet by mouth every 6 (six) hours as needed for pain. 03/22/20   Hendy Brindle, MD  pantoprazole (PROTONIX) 40 MG tablet Take 1 tablet (40 mg total) by mouth daily. 12/29/19   Belva Crome, MD  Vitamin D, Ergocalciferol, (DRISDOL) 1.25 MG (50000 UNIT) CAPS capsule Take 50,000 Units by mouth once a week. 10/20/19   [provider]    Allergies Patient has no known allergies.   REVIEW OF SYSTEMS  Negative except as noted here or in the History of Present Illness.   PHYSICAL EXAMINATION  Initial Vital Signs Blood pressure 137/76, pulse 74, temperature 98 F (36.7 C), temperature source Oral, resp.  rate 16, height 5\' 2"  (1.575 m), weight 115.2 kg, SpO2 97 %.  Examination General: Well-developed, well-nourished female in no acute distress; appearance consistent with age of record HENT: normocephalic; superficial abrasion and ecchymosis left forehead Eyes: pupils equal, round and reactive to light; extraocular muscles intact Neck: supple; no C-spine tenderness; right-sided soft tissue tenderness Heart: regular rate and rhythm Lungs: clear to auscultation bilaterally Abdomen: soft; nondistended; nontender; bowel sounds present Extremities: Tenderness of right shoulder with pain on attempted movement of right shoulder and  right elbow; right upper extremity neurovascularly intact distal to the elbow Neurologic: Awake, alert and oriented; motor function intact in all extremities and symmetric; no facial droop Skin: Warm and dry Psychiatric: Grimacing   RESULTS  Summary of this visit's results, reviewed and interpreted by myself:   EKG Interpretation  Date/Time:    Ventricular Rate:    PR Interval:    QRS Duration:   QT Interval:    QTC Calculation:   R Axis:     Text Interpretation:        Laboratory Studies: No results found for this or any previous visit (from the past 24 hour(s)). Imaging Studies: DG Shoulder Right  Result Date: 03/21/2020 CLINICAL DATA:  Status post fall EXAM: RIGHT SHOULDER - 2+ VIEW COMPARISON:  X-ray right shoulder 08/03/2015 FINDINGS: Medially and cranially displaced acute fracture of the right humeral neck (likely involving both the surgical and anatomical neck). There is intra-articular extension of the fracture to the humeral head. The femoral head and glenoid appear to be aligned. Soft tissues are unremarkable. IMPRESSION: 1. Displaced right humeral neck fracture that extends to involve the humeral head. Consider CT for further evaluation. 2. Please see separately dictated right humerus x-ray 03/21/2020. Electronically Signed   By: Iven Finn  M.D.   On: 03/21/2020 19:42   DG Forearm Right  Result Date: 03/21/2020 CLINICAL DATA:  Fall with extremity pain EXAM: RIGHT FOREARM - 2 VIEW COMPARISON:  None. FINDINGS: There is no evidence of fracture or other focal bone lesions. Soft tissues are unremarkable. IMPRESSION: Negative. Electronically Signed   By: Donavan Foil M.D.   On: 03/21/2020 19:16   CT HUMERUS RIGHT WO CONTRAST  Result Date: 03/21/2020 CLINICAL DATA:  Follow-up fracture of humerus demonstrated on radiographs. EXAM: CT OF THE RIGHT HUMERUS WITHOUT CONTRAST TECHNIQUE: Multidetector CT imaging was performed according to the standard protocol. Multiplanar CT image reconstructions were also generated. COMPARISON:  Right humerus radiographs 03/21/2020 FINDINGS: Bones/Joint/Cartilage Comminuted impacted fractures involving the right humeral head and neck. There is valgus orientation of the fracture fragments. Fracture lines extend to the glenohumeral articular surface with cortical depression at the articular surface. No apparent dislocation. The visualized right clavicle and right scapula appear intact. Midshaft and distal humerus appear intact. Moderate joint effusion. Ligaments Suboptimally assessed by CT. Muscles and Tendons Musculature appears intact with mild fatty atrophy. Soft tissues Soft tissue hematoma anterior to the shoulder and proximal right humerus. IMPRESSION: Comminuted impacted fractures involving the right humeral head and neck with valgus orientation of the fracture fragments. Fracture lines extend to the glenohumeral articular surface with cortical depression. Moderate joint effusion. Soft tissue hematoma anterior to the shoulder and proximal right humerus. Electronically Signed   By: Lucienne Capers M.D.   On: 03/21/2020 23:48   DG Humerus Right  Result Date: 03/21/2020 CLINICAL DATA:  Fall with arm pain EXAM: RIGHT HUMERUS - 2+ VIEW COMPARISON:  None. FINDINGS: AC joint appears intact. Acute proximal humerus  fracture peers to involve the head and neck. Mild valgus angulation of distal fracture fragment with impaction and mild displacement at the fracture site. IMPRESSION: Acute proximal right humerus fracture that appears to involve the humeral head and neck. Electronically Signed   By: Donavan Foil M.D.   On: 03/21/2020 19:16    ED COURSE and MDM  Nursing notes, initial and subsequent vitals signs, including pulse oximetry, reviewed and interpreted by myself.  Vitals:   03/21/20 1649 03/21/20 1650 03/21/20 2327  BP: 137/76  112/87  Pulse: 74  77  Resp: 16  20  Temp: 98 F (36.7 C)    TempSrc: Oral    SpO2: 97%  100%  Weight:  115.2 kg   Height:  5\' 2"  (1.575 m)    Medications  oxyCODONE-acetaminophen (PERCOCET/ROXICET) 5-325 MG per tablet 1 tablet (1 tablet Oral Given 03/21/20 1702)  HYDROcodone-acetaminophen (NORCO/VICODIN) 5-325 MG per tablet 2 tablet (has no administration in time range)  ondansetron (ZOFRAN-ODT) disintegrating tablet 4 mg (4 mg Oral Given 03/21/20 1702)  ondansetron (ZOFRAN) injection 4 mg (4 mg Intravenous Given 03/21/20 2322)  HYDROmorphone (DILAUDID) injection 1 mg (1 mg Intravenous Given 03/21/20 2321)  Tdap (BOOSTRIX) injection 0.5 mL (0.5 mLs Intramuscular Given 03/21/20 2341)  HYDROmorphone (DILAUDID) injection 1 mg (1 mg Intramuscular Given 03/22/20 0004)   12:31 AM Patient's right shoulder placed in shoulder immobilizer.  She is already a patient of [facility] and we will refer to her orthopedist there.  No evidence of neurovascular compromise at this time.   PROCEDURES  Procedures   ED DIAGNOSES     ICD-10-CM   1. Fracture, humerus, head, right, closed, initial encounter  S42.291A   2. Fall on steps, initial encounter  W10.8XXA   3. Facial contusion, initial encounter  S00.83XA   4. Facial abrasion, initial encounter  S00.81XA        Danali Marinos, Jenny Reichmann, MD 03/22/20 [phone]

## 2020-03-21 NOTE — ED Notes (Signed)
IV infiltrated when flushing medication. IV removed. RT to try ultrasound IV.

## 2020-03-21 NOTE — ED Notes (Signed)
ED Provider at bedside. 

## 2020-03-22 ENCOUNTER — Telehealth (HOSPITAL_BASED_OUTPATIENT_CLINIC_OR_DEPARTMENT_OTHER): Payer: Self-pay | Admitting: Emergency Medicine

## 2020-03-22 DIAGNOSIS — S42201A Unspecified fracture of upper end of right humerus, initial encounter for closed fracture: Secondary | ICD-10-CM | POA: Diagnosis not present

## 2020-03-22 MED ORDER — HYDROMORPHONE HCL 1 MG/ML IJ SOLN
1.0000 mg | Freq: Once | INTRAMUSCULAR | Status: AC
  Administered 2020-03-22: 1 mg via INTRAMUSCULAR
  Filled 2020-03-22: qty 1

## 2020-03-22 MED ORDER — ONDANSETRON 8 MG PO TBDP: 8.0000 mg | ORAL_TABLET | Freq: Three times a day (TID) | ORAL | 0 refills | Status: DC | PRN

## 2020-03-22 MED ORDER — HYDROCODONE-ACETAMINOPHEN 5-325 MG PO TABS
2.0000 | ORAL_TABLET | Freq: Once | ORAL | Status: AC
  Administered 2020-03-22: 2 via ORAL
  Filled 2020-03-22: qty 2

## 2020-03-22 MED ORDER — OXYCODONE-ACETAMINOPHEN 10-325 MG PO TABS
1.0000 | ORAL_TABLET | Freq: Four times a day (QID) | ORAL | 0 refills | Status: DC | PRN
Start: 2020-03-22 — End: 2020-04-04

## 2020-03-22 MED ORDER — OXYCODONE-ACETAMINOPHEN 10-325 MG PO TABS: 1.0000 | ORAL_TABLET | Freq: Four times a day (QID) | ORAL | 0 refills | Status: DC | PRN

## 2020-03-22 NOTE — Telephone Encounter (Signed)
Patient unable to get her meds filled.  Discussed with pharmacy who never received transmission.   Will try and re transmit.

## 2020-03-23 DIAGNOSIS — S42211A Unspecified displaced fracture of surgical neck of right humerus, initial encounter for closed fracture: Secondary | ICD-10-CM | POA: Diagnosis not present

## 2020-03-23 DIAGNOSIS — M25511 Pain in right shoulder: Secondary | ICD-10-CM | POA: Diagnosis not present

## 2020-03-26 ENCOUNTER — Ambulatory Visit: Payer: PPO | Admitting: Physical Therapy

## 2020-03-27 DIAGNOSIS — S42211K Unspecified displaced fracture of surgical neck of right humerus, subsequent encounter for fracture with nonunion: Secondary | ICD-10-CM | POA: Diagnosis not present

## 2020-03-28 ENCOUNTER — Telehealth: Payer: Self-pay | Admitting: *Deleted

## 2020-03-28 ENCOUNTER — Encounter (HOSPITAL_COMMUNITY)
Admission: RE | Admit: 2020-03-28 | Discharge: 2020-03-28 | Disposition: A | Discharge status: Home / Self Care | Payer: PPO | Source: Ambulatory Visit | Attending: Orthopedic Surgery | Admitting: Orthopedic Surgery

## 2020-03-28 ENCOUNTER — Encounter (HOSPITAL_COMMUNITY): Payer: Self-pay

## 2020-03-28 ENCOUNTER — Telehealth: Payer: Self-pay | Admitting: Interventional Cardiology

## 2020-03-28 ENCOUNTER — Encounter: Payer: PPO | Admitting: Physical Therapy

## 2020-03-28 ENCOUNTER — Other Ambulatory Visit: Payer: Self-pay

## 2020-03-28 DIAGNOSIS — Z01812 Encounter for preprocedural laboratory examination: Secondary | ICD-10-CM | POA: Diagnosis not present

## 2020-03-28 LAB — BASIC METABOLIC PANEL
Anion gap: 12 (ref 5–15)
BUN: 16 mg/dL (ref 8–23)
CO2: 25 mmol/L (ref 22–32)
Calcium: 8.7 mg/dL — ABNORMAL LOW (ref 8.9–10.3)
Chloride: 104 mmol/L (ref 98–111)
Creatinine, Ser: 0.67 mg/dL (ref 0.44–1.00)
GFR calc non Af Amer: >60 mL/min (ref >60)
Glucose, Bld: 105 mg/dL — ABNORMAL HIGH (ref 70–99)
Potassium: 3.9 mmol/L (ref 3.5–5.1)
Sodium: 141 mmol/L (ref 135–145)

## 2020-03-28 LAB — CBC
HCT: 39.1 % (ref 36.0–46.0)
Hemoglobin: 12.5 g/dL (ref 12.0–15.0)
MCH: 30.3 pg (ref 26.0–34.0)
MCHC: 32 g/dL (ref 30.0–36.0)
MCV: 94.9 fL (ref 80.0–100.0)
Platelets: 184 10*3/uL (ref 150–400)
RBC: 4.12 MIL/uL (ref 3.87–5.11)
RDW: 13 % (ref 11.5–15.5)
WBC: 5.7 10*3/uL (ref 4.0–10.5)
nRBC: 0 % (ref 0.0–0.2)

## 2020-03-28 LAB — HEMOGLOBIN A1C
Hgb A1c MFr Bld: 6.2 % — ABNORMAL HIGH (ref 4.8–5.6)
Mean Plasma Glucose: 131.24 mg/dL

## 2020-03-28 LAB — GLUCOSE, CAPILLARY: Glucose-Capillary: 105 mg/dL — ABNORMAL HIGH (ref 70–99)

## 2020-03-28 NOTE — Telephone Encounter (Signed)
I tried x 2 to reach the surgery scheduler for Dr. Victorino December so that I may inform them of the recommendations per our pre op team. I was not able to get anyone on the phone. I will fax notes to surgeon's office the pt's surgery will need to be postponed until 06/24/20 or after. See notes from the pre op team.

## 2020-03-28 NOTE — Progress Notes (Signed)
PCP -  Cardiologist - Dr. Alver Fisher LoV 01-10-20 epic  PPM/ICD -  Device Orders -  Rep Notified -   Chest x-ray - 12-18-19 epic EKG - 12-27-19 epic Stress Test -  ECHO -  Cardiac Cath - 12-23-19 epic  Sleep Study -  CPAP -   Fasting Blood Sugar -  Checks Blood Sugar _____ times a day  Blood Thinner Instructions:  Plavix Aspirin Instructions:  ERAS Protcol - PRE-SURGERY Ensure or G2-   COVID TEST- 10-8  Activity- Anesthesia review: DE stent 12-23-19 DM no meds CAD, HTN  Patient denies shortness of breath, fever, cough and chest pain at PAT appointment   All instructions explained to the patient, with a verbal understanding of the material. Patient agrees to go over the instructions while at home for a better understanding. Patient also instructed to self quarantine after being tested for COVID-19. The opportunity to ask questions was provided.

## 2020-03-28 NOTE — Telephone Encounter (Signed)
I returned call to [facility], surgery scheduler and left message to call back.

## 2020-03-28 NOTE — Telephone Encounter (Signed)
   [facility] Pre-operative Risk Assessment    HEARTCARE STAFF: -Request for surgical clearance:  1. What type of surgery is being performed? RIGHT SHOULDER REPLACEMENT  2. When is this surgery scheduled? 06/03/20   3. What type of clearance is required (medical clearance vs. Pharmacy clearance to hold med vs. Both)? BOTH  4. Are there any medications that need to be held prior to surgery and how long? ASA AND PLAVIX   5. Practice name and name of physician performing surgery?  [facility] JASON [facility]  6. What is the office phone number?  [phone]   7.   What is the office fax number? [phone]  8.   Anesthesia type (None, local, MAC, general) ? CHOICE   Devra Dopp 03/28/2020, 10:18 AM  _________________________________________________________________   (provider comments below)

## 2020-03-28 NOTE — Progress Notes (Signed)
Please place orders in epic. Thank you! 

## 2020-03-28 NOTE — Telephone Encounter (Signed)
Pt returned called, pt is in the parking  Bringing in the clearance form now, they could not get the fax to go though   Best number is [phone]

## 2020-03-28 NOTE — Telephone Encounter (Signed)
   Primary Cardiologist: Sinclair Grooms, MD  Chart reviewed as part of pre-operative protocol coverage. Patient was contacted 03/28/2020 in reference to pre-operative risk assessment for pending surgery as outlined below.  [name] was last seen on 01/10/20 by Dr. Tamala Julian.  Since that day, [name] has done well. She can complete more than 4.0 METS without angina. She has a 0.9% risk of a major cardiac complication in the perioperative period.  Therefore, based on ACC/AHA guidelines, the patient would be at acceptable risk for the planned procedure without further cardiovascular testing.   The patient was advised that if she develops new symptoms prior to surgery to contact our office to arrange for a follow-up visit, and she verbalized understanding.  Through conversation with the surgical team, they are requesting to complete the surgery on ASA and plavix given the urgent need of the surgery. I will send this note to Dr. Tamala Julian just to confirm that it is too early to interrupt DAPT. I will update the surgical team of any new recommendations, but will send this medical clearance in the meantime.  I will route this recommendation to the requesting party via Epic fax function and remove from pre-op pool. Please call with questions.  [place], PA 03/28/2020, 5:06 PM

## 2020-03-28 NOTE — Telephone Encounter (Signed)
See pre op notes for further information.

## 2020-03-28 NOTE — Telephone Encounter (Signed)
The shoulder surgery will need to wait at least 3 months post stent and ideally 6 months. If earlier interruption, increased risk of stent thrombosis will have to be accepted.

## 2020-03-28 NOTE — Telephone Encounter (Signed)
Sherry from Dr. Raelene Bott office was calling to talk to Julaine Hua regarding the patient. Please call [phone]

## 2020-03-28 NOTE — Telephone Encounter (Signed)
See below

## 2020-03-28 NOTE — Patient Instructions (Addendum)
DUE TO COVID-19 ONLY ONE VISITOR IS ALLOWED TO COME WITH YOU AND STAY IN THE WAITING ROOM ONLY DURING PRE OP AND PROCEDURE DAY OF SURGERY. THE 1 VISITOR  MAY VISIT WITH YOU AFTER SURGERY IN YOUR PRIVATE ROOM DURING VISITING HOURS ONLY!  YOU NEED TO HAVE A COVID 19 TEST ON__10-8-21_____ @_______ , THIS TEST MUST BE DONE BEFORE SURGERY,  COVID TESTING SITE [address] [place] [address], IT IS ON THE RIGHT GOING OUT [address] APPROXIMATELY  2 MINUTES PAST [employer] ON THE RIGHT. ONCE YOUR COVID TEST IS COMPLETED,  PLEASE BEGIN THE QUARANTINE INSTRUCTIONS AS OUTLINED IN YOUR HANDOUT.                [name]  03/28/2020   Your procedure is scheduled on: 04-03-20    Report to [facility] Main  Entrance   Report to admitting at     1145  AM     Call this number if you have problems the morning of surgery [phone]    Remember: Do not eat food After Midnight. NO SOLID FOOD AFTER MIDNIGHT THE NIGHT PRIOR TO SURGERY. NOTHING BY MOUTH EXCEPT CLEAR LIQUIDS UNTIL   1100 am  . PLEASE FINISH ENSURE DRINK PER SURGEON ORDER  WHICH NEEDS TO BE COMPLETED AT      1100 am then nothing by mouth      CLEAR LIQUID DIET   Foods Allowed                                                                       Coffee and tea, regular and decaf                              Plain Jell-O any favor except red or purple                                            Fruit ices (not with fruit pulp)                                     Iced Popsicles                                     Carbonated beverages, regular and diet                                    Cranberry, grape and apple juices Sports drinks like Gatorade Lightly seasoned clear broth or consume(fat free) Sugar, honey syrup  _____________________________________________________________________   RUSH YOUR TEETH MORNING OF SURGERY AND RINSE YOUR MOUTH OUT, NO CHEWING GUM CANDY OR MINTS.     Take these  medicines the morning of surgery with A SIP OF WATER: protonix, metoprolol  DO NOT TAKE ANY DIABETIC MEDICATIONS DAY OF YOUR SURGERY  You may not have any metal on your body including hair pins and              piercings  Do not wear jewelry, make-up, lotions, powders or perfumes, deodorant             Do not wear nail polish on your fingernails.  Do not shave  48 hours prior to surgery.           Do not bring valuables to the hospital. [place].  Contacts, dentures or bridgework may not be worn into surgery.       Patients discharged the day of surgery will not be allowed to drive home. IF YOU ARE HAVING SURGERY AND GOING HOME THE SAME DAY, YOU MUST HAVE AN ADULT TO DRIVE YOU HOME AND BE WITH YOU FOR 24 HOURS. YOU MAY GO HOME BY TAXI OR UBER OR ORTHERWISE, BUT AN ADULT MUST ACCOMPANY YOU HOME AND STAY WITH YOU FOR 24 HOURS.  Name and phone number of your driver:  Special Instructions: N/A              Please read over the following fact sheets you were given: _____________________________________________________________________             [facility] - Preparing for Surgery Before surgery, you can play an important role.  Because skin is not sterile, your skin needs to be as free of germs as possible.  You can reduce the number of germs on your skin by washing with CHG (chlorahexidine gluconate) soap before surgery.  CHG is an antiseptic cleaner which kills germs and bonds with the skin to continue killing germs even after washing. Please DO NOT use if you have an allergy to CHG or antibacterial soaps.  If your skin becomes reddened/irritated stop using the CHG and inform your nurse when you arrive at Short Stay. Do not shave (including legs and underarms) for at least 48 hours prior to the first CHG shower.  You may shave your face/neck. Please follow these instructions carefully:  1.  Shower with CHG Soap  the night before surgery and the  morning of Surgery.  2.  If you choose to wash your hair, wash your hair first as usual with your  normal  shampoo.  3.  After you shampoo, rinse your hair and body thoroughly to remove the  shampoo.                           4.  Use CHG as you would any other liquid soap.  You can apply chg directly  to the skin and wash                       Gently with a scrungie or clean washcloth.  5.  Apply the CHG Soap to your body ONLY FROM THE NECK DOWN.   Do not use on face/ open                           Wound or open sores. Avoid contact with eyes, ears mouth and genitals (private parts).                       Wash face,  Genitals (private parts) with your normal soap.  6.  Wash thoroughly, paying special attention to the area where your surgery  will be performed.  7.  Thoroughly rinse your body with warm water from the neck down.  8.  DO NOT shower/wash with your normal soap after using and rinsing off  the CHG Soap.                9.  Pat yourself dry with a clean towel.            10.  Wear clean pajamas.            11.  Place clean sheets on your bed the night of your first shower and do not  sleep with pets. Day of Surgery : Do not apply any lotions/deodorants the morning of surgery.  Please wear clean clothes to the hospital/surgery center.  FAILURE TO FOLLOW THESE INSTRUCTIONS MAY RESULT IN THE CANCELLATION OF YOUR SURGERY PATIENT SIGNATURE_________________________________  NURSE SIGNATURE__________________________________  ________________________________________________________________________   [name]  An incentive spirometer is a tool that can help keep your lungs clear and active. This tool measures how well you are filling your lungs with each breath. Taking long deep breaths may help reverse or decrease the chance of developing breathing (pulmonary) problems (especially infection) following:  A long period of time when  you are unable to move or be active. BEFORE THE PROCEDURE   If the spirometer includes an indicator to show your best effort, your nurse or respiratory therapist will set it to a desired goal.  If possible, sit up straight or lean slightly forward. Try not to slouch.  Hold the incentive spirometer in an upright position. INSTRUCTIONS FOR USE  1. Sit on the edge of your bed if possible, or sit up as far as you can in bed or on a chair. 2. Hold the incentive spirometer in an upright position. 3. Breathe out normally. 4. Place the mouthpiece in your mouth and seal your lips tightly around it. 5. Breathe in slowly and as deeply as possible, raising the piston or the ball toward the top of the column. 6. Hold your breath for 3-5 seconds or for as long as possible. Allow the piston or ball to fall to the bottom of the column. 7. Remove the mouthpiece from your mouth and breathe out normally. 8. Rest for a few seconds and repeat Steps 1 through 7 at least 10 times every 1-2 hours when you are awake. Take your time and take a few normal breaths between deep breaths. 9. The spirometer may include an indicator to show your best effort. Use the indicator as a goal to work toward during each repetition. 10. After each set of 10 deep breaths, practice coughing to be sure your lungs are clear. If you have an incision (the cut made at the time of surgery), support your incision when coughing by placing a pillow or rolled up towels firmly against it. Once you are able to get out of bed, walk around indoors and cough well. You may stop using the incentive spirometer when instructed by your caregiver.  RISKS AND COMPLICATIONS  Take your time so you do not get dizzy or light-headed.  If you are in pain, you may need to take or ask for pain medication before doing incentive spirometry. It is harder to take a deep breath if you are having pain. AFTER USE  Rest and breathe slowly and easily.  It can be  helpful to keep track of a log of  your progress. Your caregiver can provide you with a simple table to help with this. If you are using the spirometer at home, follow these instructions: [place] IF:   You are having difficultly using the spirometer.  You have trouble using the spirometer as often as instructed.  Your pain medication is not giving enough relief while using the spirometer.  You develop fever of 100.5 F (38.1 C) or higher. SEEK IMMEDIATE MEDICAL CARE IF:   You cough up bloody sputum that had not been present before.  You develop fever of 102 F (38.9 C) or greater.  You develop worsening pain at or near the incision site. MAKE SURE YOU:   Understand these instructions.  Will watch your condition.  Will get help right away if you are not doing well or get worse. Document Released: 10/20/2006 Document Revised: 09/01/2011 Document Reviewed: 12/21/2006 ExitCare Patient Information 2014 [employer].   ________________________________________________________________________ [facility]- Preparing for Total Shoulder Arthroplasty    Before surgery, you can play an important role. Because skin is not sterile, your skin needs to be as free of germs as possible. You can reduce the number of germs on your skin by using the following products. . Benzoyl Peroxide Gel o Reduces the number of germs present on the skin o Applied twice a day to shoulder area starting two days before surgery    ==================================================================  Please follow these instructions carefully:  BENZOYL PEROXIDE 5% GEL  Please do not use if you have an allergy to benzoyl peroxide.   If your skin becomes reddened/irritated stop using the benzoyl peroxide.  Starting two days before surgery, apply as follows: 1. Apply benzoyl peroxide in the morning and at night. Apply after taking a shower. If you are not taking a shower clean entire shoulder front,  back, and side along with the armpit with a clean wet washcloth.  2. Place a quarter-sized dollop on your shoulder and rub in thoroughly, making sure to cover the front, back, and side of your shoulder, along with the armpit.   2 days before ____ AM   ____ PM              1 day before ____ AM   ____ PM                         3. Do this twice a day for two days.  (Last application is the night before surgery, AFTER using the CHG soap as described below).  4. Do NOT apply benzoyl peroxide gel on the day of surgery.

## 2020-03-28 NOTE — Telephone Encounter (Signed)
I s/w Judeen Hammans, surgery scheduler for Dr. Victorino December. Judeen Hammans stated she d/w Dr. Stann Mainland who stated this is not an elective surgery and is a surgery of urgent matter. Pt has a Proximal humerus Fx. Judeen Hammans states per Dr. Stann Mainland he would like the input from Dr. Tamala Julian if ok to do the surgery while being on ASA and Plavix due to the urgent matter. I assured Judeen Hammans that I will let pre op team know and we will let them know of the recommendations. Judeen Hammans did say she put urgent on the clearance . I apologized and told her that the person who input the information did not put that it was urgent. Judeen Hammans thanked me for the help. I will resend to pre op team for further evaluation.

## 2020-03-28 NOTE — Telephone Encounter (Signed)
I tried to reach the pt to discuss the recommendations per cardiologist in regards to shoulder surgery. Left message to please call the office to discuss further.

## 2020-03-28 NOTE — Telephone Encounter (Signed)
I s/w Caryl Pina for Dr. Victorino December with [facility]. I informed Caryl Pina the pt is going to need to have her surgery postponed. I  Did state that I did fax notes over earlier today. I will re-fax notes again since I was able to reach their office. I informed Caryl Pina that I did try to reach out to the pt earlier as well and the pt returned my call. I stated to Caryl Pina that I thought their office may want to call the pt and inform her her surgery will need need to be postponed until 06/24/20 or after, see previous notes. I felt in the case if the pt has questions about the surgery that those would need to be answered by their office. Caryl Pina assured me she will get this information to [facility] who is the surgery scheduler for Dr. Stann Mainland. I thanked Caryl Pina for all of her help. Pt will need an appt sometime Nov or Dec for pre op clearance.

## 2020-03-28 NOTE — Telephone Encounter (Signed)
Patient underwent cardiac catheterization 12/23/19.  She received PCI with DES x1.  She will need to continue her dual antiplatelet therapy uninterrupted until at least 06/24/20.  Please contact requesting office and let them know that her surgery will need to be postponed.  Thank you.  Primary Cardiologist:Henry Nicholes Stairs III, MD  Chart reviewed as part of pre-operative protocol coverage. Because of [name]'s past medical history and time since last visit, he/she will require a follow-up visit in order to better assess preoperative cardiovascular risk.  Pre-op covering staff: - Please schedule appointment and call patient to inform them. - Please contact requesting surgeon's office via preferred method (i.e, phone, fax) to inform them of need for appointment prior to surgery.  If applicable, this message will also be routed to pharmacy pool and/or primary cardiologist for input on holding anticoagulant/antiplatelet agent as requested below so that this information is available at time of patient's appointment.   Deberah Pelton, NP  03/28/2020, 10:38 AM

## 2020-03-29 NOTE — Progress Notes (Signed)
Anesthesia Chart Review   Case: [id number] Date/Time: 04/03/20 1400   Procedure: REVERSE SHOULDER ARTHROPLASTY (Right Shoulder) - 2.5 hrs   Anesthesia type: Choice   Pre-op diagnosis: Right proximal humerus fracture   Location: [facility] ROOM 07 / [facility]   Surgeons: Nicholes Stairs, MD      DISCUSSION:69 y.o. former smoker (quit 02/17/87) with h/o HTN, GERD, CAD (s/p DES 12/23/2019), DM II, right proximal humerus fracture scheduled for above procedure 04/03/2020 with Dr. Nicholes Stairs.   S/p DES 12/23/2019 on DAPT until 06/24/20 per cardiology.  Per Dr. Mallie Mussel, "The shoulder surgery will need to wait at least 3 months post stent and ideally 6 months. If earlier interruption, increased risk of stent thrombosis will have to be accepted."  Dr. Stann Mainland aware, due to urgent nature of procedure will remain on DAPT.   Per cardiology risk assessment 03/29/2020, "Chart reviewed as part of pre-operative protocol coverage. Patient was contacted 03/28/2020 in reference to pre-operative risk assessment for pending surgery as outlined below.  [name] was last seen on 01/10/20 by Dr. Tamala Julian.  Since that day, [name] has done well. She can complete more than 4.0 METS without angina. She has a 0.9% risk of a major cardiac complication in the perioperative period. Therefore, based on ACC/AHA guidelines, the patient would be at acceptable risk for the planned procedure without further cardiovascular testing."  "She will need to remain on her aspirin and Plavix throughout her surgery.  Due to her recent stent her dual antiplatelet therapy cannot be interrupted."  Anticipate pt can proceed with planned procedure barring acute status change.   VS: BP (!) 149/93   Pulse 66   Temp 36.6 C (Oral)   Resp 17   Ht 5\' 2"  (1.575 m)   Wt 117.5 kg   SpO2 98%   BMI 47.37 kg/m   PROVIDERS: [name], DO  Daneen Schick, MD is Cardiologist  LABS: Labs reviewed: Acceptable for surgery. (all labs  ordered are listed, but only abnormal results are displayed)  Labs Reviewed  BASIC METABOLIC PANEL - Abnormal; Notable for the following components:      Result Value   Glucose, Bld 105 (*)    Calcium 8.7 (*)    All other components within normal limits  HEMOGLOBIN A1C - Abnormal; Notable for the following components:   Hgb A1c MFr Bld 6.2 (*)    All other components within normal limits  GLUCOSE, CAPILLARY - Abnormal; Notable for the following components:   Glucose-Capillary 105 (*)    All other components within normal limits  CBC     IMAGES:   EKG: 12/30/2019 Rate 75 bpm  NSR Cannot rule out inferior infarct, age undetermined ST &T wave abnormality, consider anterior ischemia   CV: Cardiac Cath 12/23/2019  2-week history of progressive new onset angina.  99% large first diagonal treated with 2.75 x 15 Onyx postdilated to 3.0 mm and 0% stenosis.  Left main is widely patent  LAD contains mid eccentric 50% stenosis.  Circumflex gives 1 large tortuous obtuse marginal and is free of disease.  Dominant with luminal irregularities in the mid body.  No significant obstruction.  Very mild mid anterior wall hypokinesis.  EF 50 to 55% which is mildly reduced for female.  LVEDP is 19 mmHg.  RECOMMENDATIONS:   Dual antiplatelet therapy for at least 6 months.  Initially aspirin and Brilinta.  After 4 to 6 weeks Brilinta could be decreased in intensity to clopidogrel plus  aspirin.  High intensity statin therapy added.  Low-dose beta-blocker therapy added.  Overnight observation since we have both radial and femoral access during the procedure.  Past Medical History:  Diagnosis Date  . 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 Chronic pain syndrome 07/09/2017  . Coronary artery disease   . Depression   . Diabetes ([place]) 12/02/2013   no current meds   . Dyspnea on exertion 09/20/2015  . Gastro-esophageal reflux   . GERD (gastroesophageal  reflux disease) 05/25/2012  . Hyperlipidemia   . Hypertension   . Hypothyroidism 05/25/2012   Last Assessment & Plan:  States was started after a partial thyroidectomy but does not recall any abnormal lab value and no symptoms.  Discussed as is on low dose, would be reasonable to discontinue and recheck in 6-8 weeks which she would like to do. Last Assessment & Plan:  States was started after a partial thyroidectomy but does not recall any abnormal lab value and no symptoms.  Discussed as   . Lumbar spondylosis 07/09/2017  . Memory disorder 10/30/2015  . Obesity, morbid, BMI 40.0-49.9 ([place]) 06/02/2014  . Osteoporosis 05/02/2013   Overview:  DEXA 11/14 DEXA 11/14  . Restless legs syndrome 05/25/2012   Last Assessment & Plan:  patient reports using clonazepam about once weekly .  Discussed nonpharmacologic tips for treating RLS.  States other medicatiosn she had treid had not been effective (does not remember name) Will refill for now.  Advised if use increases, will readdress in future  . RLS (restless legs syndrome)   . Thyroid disease   . 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 URI, acute 09/20/2015  . Vitamin B 12 deficiency   . Vitamin D deficiency 05/25/2012    Past Surgical History:  Procedure Laterality Date  . ABDOMINAL HYSTERECTOMY    . APPENDECTOMY    . CATARACT EXTRACTION    . CHOLECYSTECTOMY    . CORONARY STENT INTERVENTION N/A 12/23/2019   Procedure: CORONARY STENT INTERVENTION;  Surgeon: Belva Crome, MD;  Location: [place] [facility];  Service: Cardiovascular;  Laterality: N/A;  . JOINT REPLACEMENT     2 total knee replacement  . LASIK    . LEFT HEART CATH AND CORONARY ANGIOGRAPHY N/A 12/23/2019   Procedure: LEFT HEART CATH AND CORONARY ANGIOGRAPHY;  Surgeon: Belva Crome, MD;  Location: [place] [facility];  Service: Cardiovascular;  Laterality: N/A;  . THYROIDECTOMY    . TONSILLECTOMY      MEDICATIONS: . aspirin EC 81 MG tablet  .  atorvastatin (LIPITOR) 80 MG tablet  . citalopram (CELEXA) 10 MG tablet  . clonazePAM (KLONOPIN) 1 MG tablet  . clopidogrel (PLAVIX) 75 MG tablet  . HYDROcodone-acetaminophen (NORCO) 10-325 MG tablet  . losartan (COZAAR) 50 MG tablet  . metoprolol succinate (TOPROL-XL) 25 MG 24 hr tablet  . nitroGLYCERIN (NITROSTAT) 0.4 MG SL tablet  . ondansetron (ZOFRAN ODT) 8 MG disintegrating tablet  . oxyCODONE-acetaminophen (PERCOCET) 10-325 MG tablet  . pantoprazole (PROTONIX) 40 MG tablet  . Vitamin D, Ergocalciferol, (DRISDOL) 1.25 MG (50000 UNIT) CAPS capsule   No current facility-administered medications for this encounter.     Konrad Felix, PA-C [facility] [phone]

## 2020-03-29 NOTE — Telephone Encounter (Signed)
Page sent to Dr. Tamala Julian for clarification.  Cath Lab nurse returned page 8:33 AM.  Message left with Dr. Tamala Julian to call back my cell phone.

## 2020-03-29 NOTE — Telephone Encounter (Signed)
Sherri from [facility] calling for clarification on whether the patient is cleared for surgery. She states the surgery is urgent, not elective, because she has a fracture in her shoulder.

## 2020-03-29 NOTE — Telephone Encounter (Signed)
Spoke with Dr. Tamala Julian about [name] and her drug-eluting stent placed 12-23-19.  She may have her proximal humerus fracture repaired while on dual antiplatelet therapy.  I will resend cardiac evaluation note to [facility] and remove her from the preoperative clearance for.

## 2020-03-29 NOTE — Telephone Encounter (Signed)
Forwarded to requesting party w/ Dr Thompson Caul comment via [facility]

## 2020-03-29 NOTE — Telephone Encounter (Signed)
° °  Primary Cardiologist: Sinclair Grooms, MD  Chart reviewed as part of pre-operative protocol coverage. Given past medical history and time since last visit, based on ACC/AHA guidelines, [name] would be at acceptable risk for the planned procedure without further cardiovascular testing.   She will need to remain on her aspirin and Plavix throughout her surgery.  Due to her recent stent her dual antiplatelet therapy cannot be interrupted.  I will route this recommendation to the requesting party via Epic fax function and remove from pre-op pool.  Please call with questions.  Jossie Ng. Yosselyn Tax NP-C    03/29/2020, 4:21 PM [facility] [place] [address] Office [phone] Fax [phone]

## 2020-03-30 ENCOUNTER — Other Ambulatory Visit (HOSPITAL_COMMUNITY)
Admission: RE | Admit: 2020-03-30 | Discharge: 2020-03-30 | Disposition: A | Discharge status: Home / Self Care | Payer: PPO | Source: Ambulatory Visit | Attending: Orthopedic Surgery | Admitting: Orthopedic Surgery

## 2020-03-30 ENCOUNTER — Ambulatory Visit: Payer: PPO | Admitting: Interventional Cardiology

## 2020-03-30 DIAGNOSIS — Z20822 Contact with and (suspected) exposure to covid-19: Secondary | ICD-10-CM | POA: Insufficient documentation

## 2020-03-30 DIAGNOSIS — Z01812 Encounter for preprocedural laboratory examination: Secondary | ICD-10-CM | POA: Insufficient documentation

## 2020-03-30 LAB — SARS CORONAVIRUS 2 (TAT 6-24 HRS): SARS Coronavirus 2: NEGATIVE

## 2020-04-02 ENCOUNTER — Encounter: Payer: PPO | Admitting: Physical Therapy

## 2020-04-02 NOTE — Anesthesia Preprocedure Evaluation (Addendum)
Anesthesia Evaluation  Patient identified by MRN, date of birth, ID band Patient awake    Reviewed: Allergy & Precautions, NPO status , Patient's Chart, lab work & pertinent test results, reviewed documented beta blocker date and time   History of Anesthesia Complications Negative for: history of anesthetic complications  Airway Mallampati: II  TM Distance: >3 FB Neck ROM: Full    Dental  (+) Teeth Intact   Pulmonary former smoker,    Pulmonary exam normal breath sounds clear to auscultation       Cardiovascular hypertension, Pt. on home beta blockers and Pt. on medications (-) angina+ CAD and + Cardiac Stents  Normal cardiovascular exam Rhythm:Regular Rate:Normal     Neuro/Psych PSYCHIATRIC DISORDERS Depression negative neurological ROS     GI/Hepatic Neg liver ROS, GERD  Medicated,  Endo/Other  diabetesHypothyroidism Morbid obesity  Renal/GU negative Renal ROS     Musculoskeletal  (+) Arthritis , Fibromyalgia -, narcotic dependentRight proximal humerus fracture   Abdominal   Peds  Hematology  (+) Blood dyscrasia (Plavix), ,   Anesthesia Other Findings Day of surgery medications reviewed with the patient.  Reproductive/Obstetrics                          Anesthesia Physical Anesthesia Plan  ASA: III  Anesthesia Plan: General   Post-op Pain Management:  Regional for Post-op pain   Induction: Intravenous  PONV Risk Score and Plan: 3 and Dexamethasone, Ondansetron and Treatment may vary due to age or medical condition  Airway Management Planned: Oral ETT  Additional Equipment:   Intra-op Plan:   Post-operative Plan: Extubation in OR  Informed Consent: I have reviewed the patients History and Physical, chart, labs and discussed the procedure including the risks, benefits and alternatives for the proposed anesthesia with the patient or authorized representative who has indicated  his/her understanding and acceptance.       Plan Discussed with: CRNA  Anesthesia Plan Comments: (See PAT note 03/28/2020, Konrad Felix, PA-C)       Anesthesia Quick Evaluation

## 2020-04-03 ENCOUNTER — Encounter (HOSPITAL_COMMUNITY)
Admission: RE | Disposition: A | Discharge status: Home / Self Care | Payer: Self-pay | Source: Home / Self Care | Attending: Orthopedic Surgery

## 2020-04-03 ENCOUNTER — Observation Stay (HOSPITAL_COMMUNITY): Payer: PPO

## 2020-04-03 ENCOUNTER — Ambulatory Visit (HOSPITAL_COMMUNITY): Payer: PPO | Admitting: Physician Assistant

## 2020-04-03 ENCOUNTER — Ambulatory Visit (HOSPITAL_COMMUNITY): Payer: PPO | Admitting: Anesthesiology

## 2020-04-03 ENCOUNTER — Observation Stay (HOSPITAL_COMMUNITY)
Admission: RE | Admit: 2020-04-03 | Discharge: 2020-04-04 | Disposition: A | Discharge status: Home / Self Care | Payer: PPO | Attending: Orthopedic Surgery | Admitting: Orthopedic Surgery

## 2020-04-03 ENCOUNTER — Other Ambulatory Visit: Payer: Self-pay

## 2020-04-03 ENCOUNTER — Encounter (HOSPITAL_COMMUNITY): Payer: Self-pay | Admitting: Orthopedic Surgery

## 2020-04-03 DIAGNOSIS — E785 Hyperlipidemia, unspecified: Secondary | ICD-10-CM | POA: Diagnosis not present

## 2020-04-03 DIAGNOSIS — E119 Type 2 diabetes mellitus without complications: Secondary | ICD-10-CM | POA: Diagnosis not present

## 2020-04-03 DIAGNOSIS — I10 Essential (primary) hypertension: Secondary | ICD-10-CM | POA: Insufficient documentation

## 2020-04-03 DIAGNOSIS — Z96611 Presence of right artificial shoulder joint: Secondary | ICD-10-CM | POA: Diagnosis not present

## 2020-04-03 DIAGNOSIS — M6281 Muscle weakness (generalized): Secondary | ICD-10-CM | POA: Insufficient documentation

## 2020-04-03 DIAGNOSIS — S42241A 4-part fracture of surgical neck of right humerus, initial encounter for closed fracture: Secondary | ICD-10-CM | POA: Diagnosis not present

## 2020-04-03 DIAGNOSIS — Z7982 Long term (current) use of aspirin: Secondary | ICD-10-CM | POA: Insufficient documentation

## 2020-04-03 DIAGNOSIS — Z23 Encounter for immunization: Secondary | ICD-10-CM | POA: Insufficient documentation

## 2020-04-03 DIAGNOSIS — G894 Chronic pain syndrome: Secondary | ICD-10-CM | POA: Insufficient documentation

## 2020-04-03 DIAGNOSIS — X58XXXA Exposure to other specified factors, initial encounter: Secondary | ICD-10-CM | POA: Diagnosis not present

## 2020-04-03 DIAGNOSIS — Z87891 Personal history of nicotine dependence: Secondary | ICD-10-CM | POA: Insufficient documentation

## 2020-04-03 DIAGNOSIS — S42201A Unspecified fracture of upper end of right humerus, initial encounter for closed fracture: Principal | ICD-10-CM | POA: Insufficient documentation

## 2020-04-03 DIAGNOSIS — Z79899 Other long term (current) drug therapy: Secondary | ICD-10-CM | POA: Diagnosis not present

## 2020-04-03 DIAGNOSIS — I2510 Atherosclerotic heart disease of native coronary artery without angina pectoris: Secondary | ICD-10-CM | POA: Insufficient documentation

## 2020-04-03 DIAGNOSIS — Z7902 Long term (current) use of antithrombotics/antiplatelets: Secondary | ICD-10-CM | POA: Diagnosis not present

## 2020-04-03 DIAGNOSIS — E039 Hypothyroidism, unspecified: Secondary | ICD-10-CM | POA: Diagnosis not present

## 2020-04-03 DIAGNOSIS — G8918 Other acute postprocedural pain: Secondary | ICD-10-CM | POA: Diagnosis not present

## 2020-04-03 DIAGNOSIS — Z471 Aftercare following joint replacement surgery: Secondary | ICD-10-CM | POA: Diagnosis not present

## 2020-04-03 DIAGNOSIS — Z955 Presence of coronary angioplasty implant and graft: Secondary | ICD-10-CM | POA: Insufficient documentation

## 2020-04-03 HISTORY — PX: REVERSE SHOULDER ARTHROPLASTY: SHX5054

## 2020-04-03 LAB — GLUCOSE, CAPILLARY
Glucose-Capillary: 97 mg/dL (ref 70–99)
Glucose-Capillary: 101 mg/dL — ABNORMAL HIGH (ref 70–99)

## 2020-04-03 LAB — TYPE AND SCREEN
ABO/RH(D): AB NEG
Antibody Screen: NEGATIVE

## 2020-04-03 LAB — SURGICAL PCR SCREEN
MRSA, PCR: NEGATIVE
Staphylococcus aureus: NEGATIVE

## 2020-04-03 LAB — ABO/RH: ABO/RH(D): AB NEG

## 2020-04-03 SURGERY — ARTHROPLASTY, SHOULDER, TOTAL, REVERSE
Anesthesia: General | Site: Shoulder | Laterality: Right

## 2020-04-03 MED ORDER — CLOPIDOGREL BISULFATE 75 MG PO TABS
75.0000 mg | ORAL_TABLET | Freq: Every day | ORAL | Status: DC
  Administered 2020-04-04: 75 mg via ORAL
  Filled 2020-04-03: qty 1

## 2020-04-03 MED ORDER — LIDOCAINE 2% (20 MG/ML) 5 ML SYRINGE
INTRAMUSCULAR | Status: DC | PRN
  Administered 2020-04-03: 60 mg via INTRAVENOUS

## 2020-04-03 MED ORDER — ATORVASTATIN CALCIUM 40 MG PO TABS
80.0000 mg | ORAL_TABLET | Freq: Every day | ORAL | Status: DC
  Administered 2020-04-04: 80 mg via ORAL
  Filled 2020-04-03: qty 2

## 2020-04-03 MED ORDER — ASPIRIN EC 81 MG PO TBEC
81.0000 mg | DELAYED_RELEASE_TABLET | Freq: Every day | ORAL | Status: DC
  Administered 2020-04-04: 81 mg via ORAL
  Filled 2020-04-03: qty 1

## 2020-04-03 MED ORDER — PANTOPRAZOLE SODIUM 40 MG PO TBEC
40.0000 mg | DELAYED_RELEASE_TABLET | Freq: Every day | ORAL | Status: DC
  Administered 2020-04-04: 40 mg via ORAL
  Filled 2020-04-03: qty 1

## 2020-04-03 MED ORDER — INFLUENZA VAC A&B SA ADJ QUAD 0.5 ML IM PRSY
0.5000 mL | PREFILLED_SYRINGE | INTRAMUSCULAR | Status: AC
  Administered 2020-04-04: 0.5 mL via INTRAMUSCULAR
  Filled 2020-04-03: qty 0.5

## 2020-04-03 MED ORDER — CEFAZOLIN SODIUM-DEXTROSE 2-4 GM/100ML-% IV SOLN
2.0000 g | INTRAVENOUS | Status: AC
  Administered 2020-04-03: 2 g via INTRAVENOUS
  Filled 2020-04-03: qty 100

## 2020-04-03 MED ORDER — METOPROLOL SUCCINATE ER 25 MG PO TB24
12.5000 mg | ORAL_TABLET | Freq: Every day | ORAL | Status: DC
  Administered 2020-04-04: 12.5 mg via ORAL
  Filled 2020-04-03: qty 1

## 2020-04-03 MED ORDER — SUGAMMADEX SODIUM 200 MG/2ML IV SOLN
INTRAVENOUS | Status: DC | PRN
  Administered 2020-04-03: 200 mg via INTRAVENOUS

## 2020-04-03 MED ORDER — FENTANYL CITRATE (PF) 100 MCG/2ML IJ SOLN: 25.0000 ug | INTRAMUSCULAR | Status: DC | PRN

## 2020-04-03 MED ORDER — VANCOMYCIN HCL 1000 MG IV SOLR
INTRAVENOUS | Status: DC | PRN
  Administered 2020-04-03: 1000 mg via TOPICAL

## 2020-04-03 MED ORDER — PHENOL 1.4 % MT LIQD: 1.0000 | OROMUCOSAL | Status: DC | PRN

## 2020-04-03 MED ORDER — ACETAMINOPHEN 325 MG PO TABS: 325.0000 mg | ORAL_TABLET | Freq: Four times a day (QID) | ORAL | Status: DC | PRN

## 2020-04-03 MED ORDER — ORAL CARE MOUTH RINSE: 15.0000 mL | Freq: Once | OROMUCOSAL | Status: AC

## 2020-04-03 MED ORDER — MENTHOL 3 MG MT LOZG: 1.0000 | LOZENGE | OROMUCOSAL | Status: DC | PRN

## 2020-04-03 MED ORDER — GLYCOPYRROLATE 0.2 MG/ML IJ SOLN
INTRAMUSCULAR | Status: DC | PRN
  Administered 2020-04-03: .2 mg via INTRAVENOUS

## 2020-04-03 MED ORDER — CEFAZOLIN SODIUM-DEXTROSE 1-4 GM/50ML-% IV SOLN
1.0000 g | Freq: Four times a day (QID) | INTRAVENOUS | Status: AC
  Administered 2020-04-03 – 2020-04-04 (×3): 1 g via INTRAVENOUS
  Filled 2020-04-03 (×3): qty 50

## 2020-04-03 MED ORDER — TRANEXAMIC ACID-NACL 1000-0.7 MG/100ML-% IV SOLN
1000.0000 mg | INTRAVENOUS | Status: AC
  Administered 2020-04-03: 1000 mg via INTRAVENOUS
  Filled 2020-04-03: qty 100

## 2020-04-03 MED ORDER — 0.9 % SODIUM CHLORIDE (POUR BTL) OPTIME
TOPICAL | Status: DC | PRN
  Administered 2020-04-03: 1000 mL

## 2020-04-03 MED ORDER — TRANEXAMIC ACID 1000 MG/10ML IV SOLN
2000.0000 mg | Freq: Once | INTRAVENOUS | Status: AC
  Administered 2020-04-03: 2000 mg via TOPICAL
  Filled 2020-04-03: qty 20

## 2020-04-03 MED ORDER — LOSARTAN POTASSIUM 50 MG PO TABS
50.0000 mg | ORAL_TABLET | Freq: Every day | ORAL | Status: DC
  Administered 2020-04-04: 50 mg via ORAL
  Filled 2020-04-03: qty 1

## 2020-04-03 MED ORDER — TRANEXAMIC ACID-NACL 1000-0.7 MG/100ML-% IV SOLN
1000.0000 mg | Freq: Once | INTRAVENOUS | Status: AC
  Administered 2020-04-03: 1000 mg via INTRAVENOUS
  Filled 2020-04-03: qty 100

## 2020-04-03 MED ORDER — ONDANSETRON HCL 4 MG PO TABS: 4.0000 mg | ORAL_TABLET | Freq: Four times a day (QID) | ORAL | Status: DC | PRN

## 2020-04-03 MED ORDER — ONDANSETRON HCL 4 MG/2ML IJ SOLN
4.0000 mg | Freq: Once | INTRAMUSCULAR | Status: AC | PRN
  Administered 2020-04-03: 4 mg via INTRAVENOUS

## 2020-04-03 MED ORDER — BUPIVACAINE LIPOSOME 1.3 % IJ SUSP
INTRAMUSCULAR | Status: DC | PRN
  Administered 2020-04-03: 10 mL via PERINEURAL

## 2020-04-03 MED ORDER — PROPOFOL 10 MG/ML IV BOLUS
INTRAVENOUS | Status: AC
  Filled 2020-04-03: qty 20

## 2020-04-03 MED ORDER — ACETAMINOPHEN 500 MG PO TABS
500.0000 mg | ORAL_TABLET | Freq: Four times a day (QID) | ORAL | Status: DC
  Administered 2020-04-03 – 2020-04-04 (×2): 500 mg via ORAL
  Filled 2020-04-03 (×3): qty 1

## 2020-04-03 MED ORDER — BUPIVACAINE HCL (PF) 0.5 % IJ SOLN
INTRAMUSCULAR | Status: DC | PRN
  Administered 2020-04-03: 15 mL via PERINEURAL

## 2020-04-03 MED ORDER — MIDAZOLAM HCL 2 MG/2ML IJ SOLN
1.0000 mg | INTRAMUSCULAR | Status: DC
  Administered 2020-04-03: 2 mg via INTRAVENOUS
  Filled 2020-04-03: qty 2

## 2020-04-03 MED ORDER — FENTANYL CITRATE (PF) 100 MCG/2ML IJ SOLN
INTRAMUSCULAR | Status: AC
  Filled 2020-04-03: qty 2

## 2020-04-03 MED ORDER — DEXAMETHASONE SODIUM PHOSPHATE 10 MG/ML IJ SOLN
INTRAMUSCULAR | Status: DC | PRN
  Administered 2020-04-03: 10 mg via INTRAVENOUS

## 2020-04-03 MED ORDER — HYDROCODONE-ACETAMINOPHEN 5-325 MG PO TABS
1.0000 | ORAL_TABLET | ORAL | Status: DC | PRN
  Administered 2020-04-04: 1 via ORAL
  Filled 2020-04-03: qty 1

## 2020-04-03 MED ORDER — METOCLOPRAMIDE HCL 5 MG/ML IJ SOLN: 5.0000 mg | Freq: Three times a day (TID) | INTRAMUSCULAR | Status: DC | PRN

## 2020-04-03 MED ORDER — DOCUSATE SODIUM 100 MG PO CAPS
100.0000 mg | ORAL_CAPSULE | Freq: Two times a day (BID) | ORAL | Status: DC
  Administered 2020-04-03 – 2020-04-04 (×2): 100 mg via ORAL
  Filled 2020-04-03 (×2): qty 1

## 2020-04-03 MED ORDER — ROCURONIUM BROMIDE 10 MG/ML (PF) SYRINGE
PREFILLED_SYRINGE | INTRAVENOUS | Status: DC | PRN
  Administered 2020-04-03: 50 mg via INTRAVENOUS

## 2020-04-03 MED ORDER — ROCURONIUM BROMIDE 10 MG/ML (PF) SYRINGE
PREFILLED_SYRINGE | INTRAVENOUS | Status: AC
  Filled 2020-04-03: qty 10

## 2020-04-03 MED ORDER — MORPHINE SULFATE (PF) 2 MG/ML IV SOLN: 0.5000 mg | INTRAVENOUS | Status: DC | PRN

## 2020-04-03 MED ORDER — NITROGLYCERIN 0.4 MG SL SUBL: 0.4000 mg | SUBLINGUAL_TABLET | SUBLINGUAL | Status: DC | PRN

## 2020-04-03 MED ORDER — FENTANYL CITRATE (PF) 100 MCG/2ML IJ SOLN
50.0000 ug | INTRAMUSCULAR | Status: DC
  Administered 2020-04-03: 100 ug via INTRAVENOUS
  Filled 2020-04-03: qty 2

## 2020-04-03 MED ORDER — FENTANYL CITRATE (PF) 100 MCG/2ML IJ SOLN
INTRAMUSCULAR | Status: DC | PRN
  Administered 2020-04-03: 50 ug via INTRAVENOUS

## 2020-04-03 MED ORDER — CHLORHEXIDINE GLUCONATE 0.12 % MT SOLN
15.0000 mL | Freq: Once | OROMUCOSAL | Status: AC
  Administered 2020-04-03: 15 mL via OROMUCOSAL

## 2020-04-03 MED ORDER — VANCOMYCIN HCL 1000 MG IV SOLR
INTRAVENOUS | Status: AC
  Filled 2020-04-03: qty 1000

## 2020-04-03 MED ORDER — ACETAMINOPHEN 500 MG PO TABS
1000.0000 mg | ORAL_TABLET | Freq: Once | ORAL | Status: AC
  Administered 2020-04-03: 1000 mg via ORAL
  Filled 2020-04-03: qty 2

## 2020-04-03 MED ORDER — CLONAZEPAM 1 MG PO TABS: 1.0000 mg | ORAL_TABLET | Freq: Every evening | ORAL | Status: DC | PRN

## 2020-04-03 MED ORDER — HYDROCODONE-ACETAMINOPHEN 7.5-325 MG PO TABS: 1.0000 | ORAL_TABLET | ORAL | Status: DC | PRN

## 2020-04-03 MED ORDER — PROPOFOL 10 MG/ML IV BOLUS
INTRAVENOUS | Status: DC | PRN
  Administered 2020-04-03: 150 mg via INTRAVENOUS

## 2020-04-03 MED ORDER — METOCLOPRAMIDE HCL 5 MG PO TABS: 5.0000 mg | ORAL_TABLET | Freq: Three times a day (TID) | ORAL | Status: DC | PRN

## 2020-04-03 MED ORDER — ONDANSETRON HCL 4 MG/2ML IJ SOLN: 4.0000 mg | Freq: Four times a day (QID) | INTRAMUSCULAR | Status: DC | PRN

## 2020-04-03 MED ORDER — LIDOCAINE 2% (20 MG/ML) 5 ML SYRINGE
INTRAMUSCULAR | Status: AC
  Filled 2020-04-03: qty 5

## 2020-04-03 MED ORDER — LACTATED RINGERS IV SOLN: INTRAVENOUS | Status: DC

## 2020-04-03 MED ORDER — GABAPENTIN 300 MG PO CAPS
300.0000 mg | ORAL_CAPSULE | Freq: Three times a day (TID) | ORAL | Status: DC
  Administered 2020-04-03 – 2020-04-04 (×2): 300 mg via ORAL
  Filled 2020-04-03 (×2): qty 1

## 2020-04-03 SURGICAL SUPPLY — 59 items
BAG ZIPLOCK 12X15 (MISCELLANEOUS) ×2 IMPLANT
BIT DRILL FLUTED 3.0 STRL (BIT) ×2 IMPLANT
BLADE SAG 18X100X1.27 (BLADE) ×2 IMPLANT
CHLORAPREP W/TINT 26 (MISCELLANEOUS) IMPLANT
COVER BACK TABLE 60X90IN (DRAPES) ×2 IMPLANT
COVER SURGICAL LIGHT HANDLE (MISCELLANEOUS) ×2 IMPLANT
COVER WAND RF STERILE (DRAPES) IMPLANT
CUP SUT UNIV REVERS 36 NEUTRAL (Cup) ×2 IMPLANT
DRAPE ORTHO SPLIT 77X108 STRL (DRAPES) ×4
DRAPE SURG 17X11 SM STRL (DRAPES) ×2 IMPLANT
DRAPE SURG ORHT 6 SPLT 77X108 (DRAPES) ×2 IMPLANT
DRAPE U-SHAPE 47X51 STRL (DRAPES) ×2 IMPLANT
DRSG AQUACEL AG ADV 3.5X 6 (GAUZE/BANDAGES/DRESSINGS) IMPLANT
DRSG AQUACEL AG ADV 3.5X10 (GAUZE/BANDAGES/DRESSINGS) ×2 IMPLANT
DURAPREP 26ML APPLICATOR (WOUND CARE) ×2 IMPLANT
ELECT REM PT RETURN 15FT ADLT (MISCELLANEOUS) IMPLANT
FACESHIELD WRAPAROUND (MASK) IMPLANT
GLENOID UNI REV MOD 24 +2 LAT (Joint) ×2 IMPLANT
GLENOSPHERE 33+4 LAT/24 UNI RV (Joint) ×2 IMPLANT
GLOVE BIO SURGEON STRL SZ7.5 (GLOVE) ×8 IMPLANT
GLOVE BIOGEL PI IND STRL 8 (GLOVE) ×2 IMPLANT
GLOVE BIOGEL PI INDICATOR 8 (GLOVE) ×2
GOWN STRL REUS W/ TWL XL LVL3 (GOWN DISPOSABLE) ×2 IMPLANT
GOWN STRL REUS W/TWL XL LVL3 (GOWN DISPOSABLE) ×4
INSERT HUMERAL 36 +3/33 COMBO (Joint) ×2 IMPLANT
KIT BASIN OR (CUSTOM PROCEDURE TRAY) ×2 IMPLANT
KIT TURNOVER KIT A (KITS) IMPLANT
MANIFOLD NEPTUNE II (INSTRUMENTS) ×2 IMPLANT
NEEDLE TAPERED W/ NITINOL LOOP (MISCELLANEOUS) IMPLANT
NS IRRIG 1000ML POUR BTL (IV SOLUTION) ×2 IMPLANT
PACK SHOULDER (CUSTOM PROCEDURE TRAY) ×2 IMPLANT
PENCIL SMOKE EVACUATOR (MISCELLANEOUS) IMPLANT
PIN SET MODULAR GLENOID SYSTEM (PIN) ×2 IMPLANT
PROTECTOR NERVE ULNAR (MISCELLANEOUS) ×2 IMPLANT
RESTRAINT HEAD UNIVERSAL NS (MISCELLANEOUS) IMPLANT
SCREW CENTRAL MOD 30MM (Screw) ×2 IMPLANT
SCREW PERI LOCK 5.5X16 (Screw) ×4 IMPLANT
SCREW PERI LOCK 5.5X32 (Screw) ×4 IMPLANT
SLING ARM FOAM STRAP MED (SOFTGOODS) IMPLANT
SLING ARM IMMOBILIZER LRG (SOFTGOODS) ×2 IMPLANT
SMARTMIX MINI TOWER (MISCELLANEOUS)
SPONGE LAP 4X18 RFD (DISPOSABLE) IMPLANT
STAPLER VISISTAT 35W (STAPLE) ×2 IMPLANT
STEM HUMERAL UNI REVERS SZ6 (Stem) ×2 IMPLANT
STRIP CLOSURE SKIN 1/2X4 (GAUZE/BANDAGES/DRESSINGS) ×2 IMPLANT
SUCTION FRAZIER HANDLE 10FR (MISCELLANEOUS) ×2
SUCTION TUBE FRAZIER 10FR DISP (MISCELLANEOUS) ×1 IMPLANT
SUT FIBERWIRE #2 38 T-5 BLUE (SUTURE)
SUT MON AB 3-0 SH 27 (SUTURE) ×2
SUT MON AB 3-0 SH27 (SUTURE) ×1 IMPLANT
SUT VIC AB 0 CT1 36 (SUTURE) ×2 IMPLANT
SUT VIC AB 1 CT1 36 (SUTURE) ×2 IMPLANT
SUT VIC AB 2-0 CT1 27 (SUTURE) ×2
SUT VIC AB 2-0 CT1 TAPERPNT 27 (SUTURE) ×1 IMPLANT
SUTURE FIBERWR #2 38 T-5 BLUE (SUTURE) IMPLANT
SUTURE TAPE 1.3 40 TPR END (SUTURE) ×3 IMPLANT
SUTURETAPE 1.3 40 TPR END (SUTURE) ×6
TOWEL OR 17X26 10 PK STRL BLUE (TOWEL DISPOSABLE) ×2 IMPLANT
TOWER SMARTMIX MINI (MISCELLANEOUS) IMPLANT

## 2020-04-03 NOTE — Anesthesia Procedure Notes (Signed)
Anesthesia Regional Block: Interscalene brachial plexus block   Pre-Anesthetic Checklist: ,, timeout performed, Correct Patient, Correct Site, Correct Laterality, Correct Procedure, Correct Position, site marked, Risks and benefits discussed,  Surgical consent,  Pre-op evaluation,  At surgeon's request and post-op pain management  Laterality: Right  Prep: chloraprep       Needles:  Injection technique: Single-shot  Needle Type: Echogenic Stimulator Needle     Needle Length: 5cm  Needle Gauge: 22     Additional Needles:   Procedures:,,,, ultrasound used (permanent image in chart),,,,  Narrative:  Start time: 04/03/2020 1:24 PM End time: 04/03/2020 1:29 PM Injection made incrementally with aspirations every 5 mL.  Performed by: Personally  Anesthesiologist: Catalina Gravel, MD  Additional Notes: Functioning IV was confirmed and monitors were applied.  A 42mm 22ga Arrow echogenic stimulator needle was used. Sterile prep and drape, hand hygiene, and sterile gloves were used.  Negative aspiration and negative test dose prior to incremental administration of local anesthetic. The patient tolerated the procedure well.  Ultrasound guidance: relevent anatomy identified, needle position confirmed, local anesthetic spread visualized around nerve(s), vascular puncture avoided.  Image printed for medical record.

## 2020-04-03 NOTE — Op Note (Signed)
Date: 04/03/2020   PRE-OPERATIVE DIAGNOSIS:  Right 4 part proximal humerus fracture   POST-OPERATIVE DIAGNOSIS:  Same   PROCEDURE:  1. REVERSE SHOULDER ARTHROPLASTY    SURGEON:  Nicholes Stairs, MD   ASSISTANT: Jonelle Sidle, PA-C  Assistant attestation:  PA Mcclung was utilized throughout the entire procedure for positioning the patient, making the approach to the proximal humerus including placing deep retractors.  Was also utilized for implantation of the final components.  He was also utilized for safe and expeditious closure of the entire incision and transfer back to PACU.   ANESTHESIA:   General with a block   ESTIMATED BLOOD LOSS: See anesthesia record   PREOPERATIVE INDICATIONS:  [name] is a RHD 69 yo female who sustained a comminuted right proximal humerus fracture with head splitting component following a fall.  Due to the comminution and displaced nature of the fracture and head splitting components we discussed operative management.  We discussed moving forward with reverse shoulder arthroplasty for his injury to allow earlier weight bearing on a walker and/or cane as needed and lower risk of AVN, non union or progression of shoulder arthritis following the fracture. Thus we elected to proceed with reverse shoulder arthroplasty for the plan.  The risks benefits and alternatives were discussed with the patient preoperatively including but not limited to the risks of infection, bleeding, nerve injury, cardiopulmonary complications, the need for revision surgery, dislocation, brachial plexus palsy, incomplete relief of pain, among others, and the patient was willing to proceed. The patient did provided informed consent.   OPERATIVE IMPLANTS:  Arthrex reverse Size 6 standard reverse stem press-fit. 33+4 glenosphere with a 24+2 baseplate. 6.5 mm central compression screw.  4 peripheral locking screws in the baseplate  On the humeral side we placed a standard  humeral tray with a +3 polyethylene insert  OPERATIVE FINDINGS:   Comminuted proximal humerus fracture with head splitting component.  There was a metaphyseal split noted laterally with the greater tuberosity attached.  There was a fragment of posterior inferior tuberosity that was noted along the inferior aspect of the glenoid neck.  This seemed to have some teres minor attachment.  We did also encounter some increased bleeding throughout the procedure due to her dual antiplatelet therapy secondary to recent percutaneous cardiac intervention.  I we did address this with IV TXA as well as topical TXA and meticulous hemostasis.   OPERATIVE PROCEDURE: The patient was brought to the operating room and placed in the supine position. General anesthesia was administered. IV antibiotics were given. Time out was performed. The upper extremity was prepped and draped in usual sterile fashion. The patient was in a beachchair position. Deltopectoral approach was carried out. After dissection through skin and subcutaneous fat, the cephalic vein was identified with the deltopectoral interval.  This was mobilized and taken laterally.   The fracture was identified and working through the fracture in the biceps groove the lesser and greater tuberosities were freed up and tagged with #2 Fiber wire sutures.  The humeral head was fragmented and removed from the wound.    Due to the coronal plane split in the humeral head and a concomitant axial fracture just at the surgical neck we were able to preserve the bulk of the greater tuberosity. .   I then performed circumferential releases of the humerus.  I then moved to sizing the humerus.  There was minimal calcar bone loss and this was used to reference the height of the stem, as  was the upper border of the pectoralis major tendon.  The canal was reamed and found to fit best with a 6 mm fracture stem.   We next turned to the glenoid.  Deep retractors were placed, and I  resected the labrum as well as the residual long head of biceps, and then placed a guidepin into the center position on the glenoid, with slight inferior declination. I then reamed over the guidepin, and this created a small metaphyseal cancellus blush inferiorly, removing just the cartilage to the subchondral bone superiorly. The base plate was selected and impacted place, and then I secured it centrally with a nonlocking screw, and I had excellent purchase both inferiorly and superiorly. I placed a short locking screws on anterior aspect,  And posterior aspect.   I then turned my attention to the glenosphere, and impacted this into place.    The glenoid sphere was completely seated, and had engagement of the [facility] taper. I then turned my attention back to the humerus.    The 6 mm fracture stem was seated to the appropriate height to allow approximate 5.6 cm from the top of the Pectoralis major tendon to the top of the glenosphere.  The stem was placed in 30 degrees of retroversion.   Once the stem was impacted into place we trialed poly liners.  Using the standard humeral metaglen,  The shoulder had excellent motion, and was stable.  The final poly (+3 mm) was impacted and again showed good motion and stability.  Next, I irrigated the wounds copiously.    The greater tuberosity was brought back to the humeral stem suture holes and secured with bone graft from the humeral head as augment.  Likewise, the subscapularis which had previously been tagged for later repair was brought back to the anterior aspect of the humeral tray utilizing the suture cup holes. The axillary nerve was palpated at the end of implanting, and found to be in continuity and not under undo tension.   I then irrigated the shoulder copiously once more, I did also place 1 g of vancomycin powder into the deltopectoral interval, then repaired the deltopectoral interval with Vicryl followed by subcutaneous monocryl and then staples and  gauze for the skin. The patient was awakened and returned back in stable and satisfactory condition. There were no complications and she tolerated the procedure well.  All counts were correct.  The patient awakened from general anesthesia with no complications and transferred to PACU in stable condition.   Postoperative Plan: [name]  will be nonweightbearing to the right upper extremity for approximately 6 weeks.  She will be quiet in the sling for the first 2 weeks and we will begin some passive range of motion in the outpatient setting.  She can move the hand, elbow, and wrist as tolerated and use that while in the sling for activities of daily living.  Otherwise she will continue on her dual antiplatelet therapy as per before surgery.  No additional DVT prophylaxis.  Furthermore, we will admit her overnight for lab analysis in the a.m. and for evaluation by occupational therapy before discharge.

## 2020-04-03 NOTE — Brief Op Note (Signed)
04/03/2020  4:21 PM  PATIENT:  [name]  69 y.o. female  PRE-OPERATIVE DIAGNOSIS:  Right 4 part proximal humerus fracture  POST-OPERATIVE DIAGNOSIS:  Right 4 part proximal humerus fracture  PROCEDURE:  Procedure(s) with comments: REVERSE SHOULDER ARTHROPLASTY (Right) - 2.5 hrs  SURGEON:  Surgeon(s) and Role:    * Nicholes Stairs, MD - Primary  PHYSICIAN ASSISTANT: Jonelle Sidle, PA-C.  ANESTHESIA:   regional and general  EBL:  200 mL   BLOOD ADMINISTERED:none  DRAINS: none   LOCAL MEDICATIONS USED:  NONE  SPECIMEN:  No Specimen  DISPOSITION OF SPECIMEN:  N/A  COUNTS:  YES  TOURNIQUET:  * No tourniquets in log *  DICTATION: .Note written in EPIC  PLAN OF CARE: Admit for overnight observation  PATIENT DISPOSITION:  PACU - hemodynamically stable.   Delay start of Pharmacological VTE agent (>24hrs) due to surgical blood loss or risk of bleeding: not applicable

## 2020-04-03 NOTE — Transfer of Care (Signed)
Immediate Anesthesia Transfer of Care Note  Patient: [name]  Procedure(s) Performed: Procedure(s) with comments: REVERSE SHOULDER ARTHROPLASTY (Right) - 2.5 hrs  Patient Location: PACU  Anesthesia Type:General  Level of Consciousness: Alert, Awake, Oriented  Airway & Oxygen Therapy: Patient Spontanous Breathing  Post-op Assessment: Report given to RN  Post vital signs: Reviewed and stable  Last Vitals:  Vitals:   04/03/20 1335 04/03/20 1336  BP: (!) 164/98   Pulse: 68 67  Resp: 18 17  Temp:    SpO2: 255% 258%    Complications: No apparent anesthesia complications

## 2020-04-03 NOTE — Anesthesia Postprocedure Evaluation (Signed)
Anesthesia Post Note  Patient: [name]  Procedure(s) Performed: REVERSE SHOULDER ARTHROPLASTY (Right Shoulder)     Patient location during evaluation: PACU Anesthesia Type: General and Regional Level of consciousness: awake and alert Pain management: pain level controlled Vital Signs Assessment: post-procedure vital signs reviewed and stable Respiratory status: spontaneous breathing, nonlabored ventilation, respiratory function stable and patient connected to nasal cannula oxygen Cardiovascular status: blood pressure returned to baseline and stable Postop Assessment: no apparent nausea or vomiting Anesthetic complications: no   No complications documented.  Last Vitals:  Vitals:   04/03/20 1645 04/03/20 1700  BP: (!) 148/111 (!) 148/80  Pulse: 79 77  Resp: (!) 23 (!) 23  Temp:    SpO2: 98% 95%    Last Pain:  Vitals:   04/03/20 1700  TempSrc:   PainSc: 0-No pain                 Anushri Casalino S

## 2020-04-03 NOTE — Progress Notes (Signed)
Assisted Dr. Turk with right, ultrasound guided, interscalene canal block. Side rails up, monitors on throughout procedure. See vital signs in flow sheet. Tolerated Procedure well.  

## 2020-04-03 NOTE — Anesthesia Procedure Notes (Signed)
Procedure Name: Intubation Date/Time: 04/03/2020 2:07 PM Performed by: Gerald Leitz, CRNA Pre-anesthesia Checklist: Patient identified, Patient being monitored, Timeout performed, Emergency Drugs available and Suction available Patient Re-evaluated:Patient Re-evaluated prior to induction Oxygen Delivery Method: Circle system utilized Preoxygenation: Pre-oxygenation with 100% oxygen Induction Type: IV induction Ventilation: Mask ventilation without difficulty Laryngoscope Size: Mac and 3 Grade View: Grade I Tube type: Oral Tube size: 7.0 mm Number of attempts: 1 Placement Confirmation: ETT inserted through vocal cords under direct vision,  positive ETCO2 and breath sounds checked- equal and bilateral Secured at: 21 cm Tube secured with: Tape Dental Injury: Teeth and Oropharynx as per pre-operative assessment

## 2020-04-03 NOTE — H&P (Signed)
ORTHOPAEDIC H and P  REQUESTING PHYSICIAN: Nicholes Stairs, MD  PCP:  [name], DO  Chief Complaint: Right shoulder fracture  HPI: [name] is a 69 y.o. female who complains of right shoulder pain following a fall prior to today's encounter.  She was evaluated in my office and found to have a four-part proximal humerus fracture with a head splitting component.  She is here for operative treatment.  We have previously discussed the recommendation to proceed with a reverse shoulder arthroplasty due to the involvement of the humeral head and high likelihood of avascular necrosis.  All questions were solicited and answered to her satisfaction.  No new complaints today.  Past Medical History:  Diagnosis Date  . 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 Chronic pain syndrome 07/09/2017  . Coronary artery disease   . Depression   . Diabetes ([place]) 12/02/2013   no current meds   . Dyspnea on exertion 09/20/2015  . Gastro-esophageal reflux   . GERD (gastroesophageal reflux disease) 05/25/2012  . Hyperlipidemia   . Hypertension   . Hypothyroidism 05/25/2012   Last Assessment & Plan:  States was started after a partial thyroidectomy but does not recall any abnormal lab value and no symptoms.  Discussed as is on low dose, would be reasonable to discontinue and recheck in 6-8 weeks which she would like to do. Last Assessment & Plan:  States was started after a partial thyroidectomy but does not recall any abnormal lab value and no symptoms.  Discussed as   . Lumbar spondylosis 07/09/2017  . Memory disorder 10/30/2015  . Obesity, morbid, BMI 40.0-49.9 ([place]) 06/02/2014  . Osteoporosis 05/02/2013   Overview:  DEXA 11/14 DEXA 11/14  . Restless legs syndrome 05/25/2012   Last Assessment & Plan:  patient reports using clonazepam about once weekly .  Discussed nonpharmacologic tips for treating RLS.  States other medicatiosn she had treid had not been  effective (does not remember name) Will refill for now.  Advised if use increases, will readdress in future  . RLS (restless legs syndrome)   . Thyroid disease   . 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 URI, acute 09/20/2015  . Vitamin B 12 deficiency   . Vitamin D deficiency 05/25/2012   Past Surgical History:  Procedure Laterality Date  . ABDOMINAL HYSTERECTOMY    . APPENDECTOMY    . CATARACT EXTRACTION    . CHOLECYSTECTOMY    . CORONARY STENT INTERVENTION N/A 12/23/2019   Procedure: CORONARY STENT INTERVENTION;  Surgeon: Belva Crome, MD;  Location: [facility];  Service: Cardiovascular;  Laterality: N/A;  . JOINT REPLACEMENT     2 total knee replacement  . LASIK    . LEFT HEART CATH AND CORONARY ANGIOGRAPHY N/A 12/23/2019   Procedure: LEFT HEART CATH AND CORONARY ANGIOGRAPHY;  Surgeon: Belva Crome, MD;  Location: [facility];  Service: Cardiovascular;  Laterality: N/A;  . THYROIDECTOMY    . TONSILLECTOMY     Social History   Socioeconomic History  . Marital status: Divorced    Spouse name: Not on file  . Number of children: Not on file  . Years of education: Not on file  . Highest education level: Not on file  Occupational History  . Not on file  Tobacco Use  . Smoking status: Former Smoker    Types: Cigarettes  Quit date: 02/17/1987    Years since quitting: 33.1  . Smokeless tobacco: Never Used  Vaping Use  . Vaping Use: Never used  Substance and Sexual Activity  . Alcohol use: No  . Drug use: No  . Sexual activity: Not Currently  Other Topics Concern  . Not on file  Social History Narrative   Lives at home alone   Right-handed   Social Determinants of Health   Financial Resource Strain:   . Difficulty of Paying Living Expenses: Not on file  Food Insecurity:   . Worried About Charity fundraiser in the Last Year: Not on file  . Ran Out of Food in the Last Year: Not on file  Transportation Needs:   .  Lack of Transportation (Medical): Not on file  . Lack of Transportation (Non-Medical): Not on file  Physical Activity:   . Days of Exercise per Week: Not on file  . Minutes of Exercise per Session: Not on file  Stress:   . Feeling of Stress : Not on file  Social Connections:   . Frequency of Communication with Friends and Family: Not on file  . Frequency of Social Gatherings with Friends and Family: Not on file  . Attends Religious Services: Not on file  . Active Member of Clubs or Organizations: Not on file  . Attends Archivist Meetings: Not on file  . Marital Status: Not on file   Family History  Problem Relation Age of Onset  . Cancer Mother   . Hypertension Mother   . Cancer Father   . Diabetes Sister   . Heart disease Brother   . Diabetes Brother   . Diabetes Brother   . Breast cancer Neg Hx    No Known Allergies Prior to Admission medications   Medication Sig Start Date End Date Taking? Authorizing Provider  aspirin EC 81 MG tablet Take 1 tablet (81 mg total) by mouth daily. Swallow whole. 12/21/19  Yes Belva Crome, MD  atorvastatin (LIPITOR) 80 MG tablet Take 1 tablet (80 mg total) by mouth daily. 12/25/19  Yes Theora Gianotti, NP  clonazePAM (KLONOPIN) 1 MG tablet Take 1 mg by mouth at bedtime as needed (restless leg).  03/05/20  Yes [provider]  clopidogrel (PLAVIX) 75 MG tablet Take 1 tablet (75 mg total) by mouth daily. 12/30/19  Yes Belva Crome, MD  HYDROcodone-acetaminophen [facility]) 10-325 MG tablet Take 1 tablet by mouth 2 (two) times daily as needed for pain. 03/22/20  Yes [provider]  losartan (COZAAR) 50 MG tablet Take 50 mg by mouth daily.    Yes [provider]  metoprolol succinate (TOPROL-XL) 25 MG 24 hr tablet Take 0.5 tablets (12.5 mg total) by mouth daily. 12/25/19  Yes Theora Gianotti, NP  ondansetron (ZOFRAN ODT) 8 MG disintegrating tablet Take 1 tablet (8 mg total) by mouth every 8 (eight)  hours as needed for nausea or vomiting. 03/22/20  Yes Deno Etienne, DO  pantoprazole (PROTONIX) 40 MG tablet Take 1 tablet (40 mg total) by mouth daily. 12/29/19  Yes Belva Crome, MD  citalopram (CELEXA) 10 MG tablet Take 10 mg by mouth daily. Patient not taking: Reported on 03/27/2020 03/05/20   [provider]  nitroGLYCERIN (NITROSTAT) 0.4 MG SL tablet Place 1 tablet (0.4 mg total) under the tongue every 5 (five) minutes as needed for chest pain. 12/21/19   Belva Crome, MD  oxyCODONE-acetaminophen (PERCOCET) 10-325 MG tablet Take 1 tablet  by mouth every 6 (six) hours as needed for pain. Patient not taking: Reported on 03/27/2020 03/22/20   Deno Etienne, DO  Vitamin D, Ergocalciferol, (DRISDOL) 1.25 MG (50000 UNIT) CAPS capsule Take 50,000 Units by mouth once a week. Patient not taking: Reported on 03/27/2020 10/20/19   [provider]   No results found.  Positive ROS: All other systems have been reviewed and were otherwise negative with the exception of those mentioned in the HPI and as above.  Physical Exam: General: Alert, no acute distress Cardiovascular: No pedal edema Respiratory: No cyanosis, no use of accessory musculature GI: No organomegaly, abdomen is soft and non-tender Skin: No lesions in the area of chief complaint Neurologic: Sensation intact distally Psychiatric: Patient is competent for consent with normal mood and affect Lymphatic: No axillary or cervical lymphadenopathy  MUSCULOSKELETAL:  Right shoulder and upper extremity:  Mild bruising about the axilla and down to the medial humerus.  This is improved since last visit.  Otherwise distally neurovascularly intact.  Assessment: Right four-part proximal humerus fracture with head splitting component  Plan: -Plan will be to proceed today with reverse shoulder arthroplasty.  We discussed this in the office in detail.  No new recommendations at this time.  We did discuss that given her recent percutaneous  cardiac intervention she will need to be on dual antiplatelet therapies throughout this procedure.  We will monitor closely for blood loss and utilize IV TXA.  -After discussion of the risk of bleeding, infection, damage to surrounding nerves and vessels, periprosthetic fracture, future dislocation, need for further surgery in the future, and the risk of anesthesia.  She has provided informed consent.  -We will likely need an overnight stay given the need for postoperative bleeding monitoring.    Nicholes Stairs, MD Cell [phone]    04/03/2020 12:35 PM

## 2020-04-04 ENCOUNTER — Encounter (HOSPITAL_COMMUNITY): Payer: Self-pay | Admitting: Orthopedic Surgery

## 2020-04-04 ENCOUNTER — Telehealth: Payer: Self-pay | Admitting: Interventional Cardiology

## 2020-04-04 DIAGNOSIS — S42201A Unspecified fracture of upper end of right humerus, initial encounter for closed fracture: Secondary | ICD-10-CM | POA: Diagnosis not present

## 2020-04-04 LAB — BASIC METABOLIC PANEL
Anion gap: 7 (ref 5–15)
BUN: 9 mg/dL (ref 8–23)
CO2: 22 mmol/L (ref 22–32)
Calcium: 8.2 mg/dL — ABNORMAL LOW (ref 8.9–10.3)
Chloride: 108 mmol/L (ref 98–111)
Creatinine, Ser: 0.49 mg/dL (ref 0.44–1.00)
GFR, Estimated: >60 mL/min (ref >60)
Glucose, Bld: 171 mg/dL — ABNORMAL HIGH (ref 70–99)
Potassium: 3.7 mmol/L (ref 3.5–5.1)
Sodium: 137 mmol/L (ref 135–145)

## 2020-04-04 LAB — HEMOGLOBIN AND HEMATOCRIT, BLOOD
HCT: 33 % — ABNORMAL LOW (ref 36.0–46.0)
Hemoglobin: 10.9 g/dL — ABNORMAL LOW (ref 12.0–15.0)

## 2020-04-04 MED ORDER — DOCUSATE SODIUM 100 MG PO CAPS
100.0000 mg | ORAL_CAPSULE | Freq: Two times a day (BID) | ORAL | 0 refills | Status: DC
Start: 2020-04-04 — End: 2020-10-16

## 2020-04-04 MED ORDER — ACETAMINOPHEN 500 MG PO TABS
500.0000 mg | ORAL_TABLET | Freq: Four times a day (QID) | ORAL | 0 refills | Status: DC
Start: 2020-04-04 — End: 2020-10-25

## 2020-04-04 MED ORDER — ONDANSETRON HCL 4 MG PO TABS: 4.0000 mg | ORAL_TABLET | Freq: Four times a day (QID) | ORAL | 0 refills | Status: DC | PRN

## 2020-04-04 MED ORDER — OXYCODONE-ACETAMINOPHEN 10-325 MG PO TABS
1.0000 | ORAL_TABLET | ORAL | 0 refills | Status: DC | PRN
Start: 2020-04-04 — End: 2020-10-25

## 2020-04-04 NOTE — Discharge Summary (Signed)
Patient ID: [name] MRN: [id number] DOB/AGE: Nov 10, 1950 69 y.o.  Admit date: 04/03/2020 Discharge date:   Primary Diagnosis: Right proximal humerus fracture Admission Diagnoses: Right proximal humerus fracture , s/p right reverse total shoulder arthroplasty for fracture Past Medical History:  Diagnosis Date  . 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 Chronic pain syndrome 07/09/2017  . Coronary artery disease   . Depression   . Diabetes ([place]) 12/02/2013   no current meds   . Dyspnea on exertion 09/20/2015  . Gastro-esophageal reflux   . GERD (gastroesophageal reflux disease) 05/25/2012  . Hyperlipidemia   . Hypertension   . Hypothyroidism 05/25/2012   Last Assessment & Plan:  States was started after a partial thyroidectomy but does not recall any abnormal lab value and no symptoms.  Discussed as is on low dose, would be reasonable to discontinue and recheck in 6-8 weeks which she would like to do. Last Assessment & Plan:  States was started after a partial thyroidectomy but does not recall any abnormal lab value and no symptoms.  Discussed as   . Lumbar spondylosis 07/09/2017  . Memory disorder 10/30/2015  . Obesity, morbid, BMI 40.0-49.9 ([place]) 06/02/2014  . Osteoporosis 05/02/2013   Overview:  DEXA 11/14 DEXA 11/14  . Restless legs syndrome 05/25/2012   Last Assessment & Plan:  patient reports using clonazepam about once weekly .  Discussed nonpharmacologic tips for treating RLS.  States other medicatiosn she had treid had not been effective (does not remember name) Will refill for now.  Advised if use increases, will readdress in future  . RLS (restless legs syndrome)   . Thyroid disease   . 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 URI, acute 09/20/2015  . Vitamin B 12 deficiency   . Vitamin D deficiency 05/25/2012   Discharge Diagnoses:   Active Problems:   Closed fracture of right proximal  humerus  Estimated body mass index is 46.09 kg/m as calculated from the following:   Height as of this encounter: 5\' 2"  (1.575 m).   Weight as of this encounter: 114.3 kg.  Procedure:  Procedure(s) (LRB): REVERSE SHOULDER ARTHROPLASTY (Right)   Consults: None  HPI:  Patient is a 69 year old female that had a fall on 03/21/2020 resulting in a right proximal humerus fracture. She was seen in the clinic and recommended a right reverse total shoulder due to the complexity of her fracture and high risk of poor healing. She had a stent placed 3 months ago and is on aspirin and plavix at baseline. Patient underwent a right reverse total shoulder arthroplasty on 04/03/2020.  Laboratory Data: Admission on 04/03/2020  Component Date Value Ref Range Status  . MRSA, PCR 04/03/2020 NEGATIVE  NEGATIVE Final  . Staphylococcus aureus 04/03/2020 NEGATIVE  NEGATIVE Final   Comment: (NOTE) The Xpert SA Assay (FDA approved for NASAL specimens in patients 59 years of age and older), is one component of a comprehensive surveillance program. It is not intended to diagnose infection nor to guide or monitor treatment. Performed at [facility], [place] [address] [place], [place] [address]   . Glucose-Capillary 04/03/2020 97  70 - 99 mg/dL Final   Glucose reference range applies only to samples taken after fasting for at least 8 hours.  . Comment 1 04/03/2020 Notify RN   Final  . Comment 2 04/03/2020 Document in Chart   Final  .  ABO/RH(D) 04/03/2020 AB NEG   Final  . Antibody Screen 04/03/2020 NEG   Final  . Sample Expiration 04/03/2020    Final                   Value:04/06/2020,2359 Performed at [facility], [place] [address] [place], [place] [address]   . ABO/RH(D) 04/03/2020    Final                   Value:AB NEG Performed at [facility], [place] [address], [place], [place] [address]   . Glucose-Capillary 04/03/2020 101* 70 - 99 mg/dL Final    Glucose reference range applies only to samples taken after fasting for at least 8 hours.  . Hemoglobin 04/04/2020 10.9* 12.0 - 15.0 g/dL Final  . HCT 04/04/2020 33.0* 36 - 46 % Final   Performed at [facility], [place] [address], [place], [place] [address]  . Sodium 04/04/2020 137  135 - 145 mmol/L Final  . Potassium 04/04/2020 3.7  3.5 - 5.1 mmol/L Final  . Chloride 04/04/2020 108  98 - 111 mmol/L Final  . CO2 04/04/2020 22  22 - 32 mmol/L Final  . Glucose, Bld 04/04/2020 171* 70 - 99 mg/dL Final   Glucose reference range applies only to samples taken after fasting for at least 8 hours.  . BUN 04/04/2020 9  8 - 23 mg/dL Final  . Creatinine, Ser 04/04/2020 0.49  0.44 - 1.00 mg/dL Final  . Calcium 04/04/2020 8.2* 8.9 - 10.3 mg/dL Final  . GFR, Estimated 04/04/2020 >60  >60 mL/min Final  . Anion gap 04/04/2020 7  5 - 15 Final   Performed at [facility], [place] [address], [place], [place] [address]  Hospital Outpatient Visit on 03/30/2020  Component Date Value Ref Range Status  . SARS Coronavirus 2 03/30/2020 NEGATIVE  NEGATIVE Final   Comment: (NOTE) SARS-CoV-2 target nucleic acids are NOT DETECTED.  The SARS-CoV-2 RNA is generally detectable in upper and lower respiratory specimens during the acute phase of infection. Negative results do not preclude SARS-CoV-2 infection, do not rule out co-infections with other pathogens, and should not be used as the sole basis for treatment or other patient management decisions. Negative results must be combined with clinical observations, patient history, and epidemiological information. The expected result is Negative.  Fact Sheet for Patients: SugarRoll.be  Fact Sheet for Healthcare Providers: https://www.woods-mathews.com/  This test is not yet approved or cleared by the [place] FDA and  has been authorized for detection and/or diagnosis of SARS-CoV-2 by FDA  under an Emergency Use Authorization (EUA). This EUA will remain  in effect (meaning this test can be used) for the duration of the COVID-19 declaration under Se                          ction 564(b)(1) of the Act, 21 U.S.C. section 360bbb-3(b)(1), unless the authorization is terminated or revoked sooner.  Performed at [facility], [place] [address], [place], [place] [address]   Hospital Outpatient Visit on 03/28/2020  Component Date Value Ref Range Status  . Sodium 03/28/2020 141  135 - 145 mmol/L Final  . Potassium 03/28/2020 3.9  3.5 - 5.1 mmol/L Final  . Chloride 03/28/2020 104  98 - 111 mmol/L Final  . CO2 03/28/2020 25  22 - 32 mmol/L Final  . Glucose, Bld 03/28/2020 105* 70 - 99 mg/dL Final  Glucose reference range applies only to samples taken after fasting for at least 8 hours.  . BUN 03/28/2020 16  8 - 23 mg/dL Final  . Creatinine, Ser 03/28/2020 0.67  0.44 - 1.00 mg/dL Final  . Calcium 03/28/2020 8.7* 8.9 - 10.3 mg/dL Final  . GFR calc non Af Amer 03/28/2020 >60  >60 mL/min Final  . Anion gap 03/28/2020 12  5 - 15 Final   Performed at [facility], [place] [address], [place], [place] [address]  . Hgb A1c MFr Bld 03/28/2020 6.2* 4.8 - 5.6 % Final   Comment: (NOTE) Pre diabetes:          5.7%-6.4%  Diabetes:              >6.4%  Glycemic control for   <7.0% adults with diabetes   . Mean Plasma Glucose 03/28/2020 131.24  mg/dL Final   Performed at [facility], [place] [address] [place], [place] [address]  . WBC 03/28/2020 5.7  4.0 - 10.5 K/uL Final  . RBC 03/28/2020 4.12  3.87 - 5.11 MIL/uL Final  . Hemoglobin 03/28/2020 12.5  12.0 - 15.0 g/dL Final  . HCT 03/28/2020 39.1  36 - 46 % Final  . MCV 03/28/2020 94.9  80.0 - 100.0 fL Final  . MCH 03/28/2020 30.3  26.0 - 34.0 pg Final  . MCHC 03/28/2020 32.0  30.0 - 36.0 g/dL Final  . RDW 03/28/2020 13.0  11.5 - 15.5 % Final  . Platelets 03/28/2020 184  150 - 400 K/uL Final  . nRBC 03/28/2020 0.0   0.0 - 0.2 % Final   Performed at [facility], [place] [address], [place], [place] [address]  . Glucose-Capillary 03/28/2020 105* 70 - 99 mg/dL Final   Glucose reference range applies only to samples taken after fasting for at least 8 hours.     X-Rays:DG Shoulder Right  Result Date: 03/21/2020 CLINICAL DATA:  Status post fall EXAM: RIGHT SHOULDER - 2+ VIEW COMPARISON:  X-ray right shoulder 08/03/2015 FINDINGS: Medially and cranially displaced acute fracture of the right humeral neck (likely involving both the surgical and anatomical neck). There is intra-articular extension of the fracture to the humeral head. The femoral head and glenoid appear to be aligned. Soft tissues are unremarkable. IMPRESSION: 1. Displaced right humeral neck fracture that extends to involve the humeral head. Consider CT for further evaluation. 2. Please see separately dictated right humerus x-ray 03/21/2020. Electronically Signed   By: Iven Finn M.D.   On: 03/21/2020 19:42   DG Forearm Right  Result Date: 03/21/2020 CLINICAL DATA:  Fall with extremity pain EXAM: RIGHT FOREARM - 2 VIEW COMPARISON:  None. FINDINGS: There is no evidence of fracture or other focal bone lesions. Soft tissues are unremarkable. IMPRESSION: Negative. Electronically Signed   By: Donavan Foil M.D.   On: 03/21/2020 19:16   CT HUMERUS RIGHT WO CONTRAST  Result Date: 03/21/2020 CLINICAL DATA:  Follow-up fracture of humerus demonstrated on radiographs. EXAM: CT OF THE RIGHT HUMERUS WITHOUT CONTRAST TECHNIQUE: Multidetector CT imaging was performed according to the standard protocol. Multiplanar CT image reconstructions were also generated. COMPARISON:  Right humerus radiographs 03/21/2020 FINDINGS: Bones/Joint/Cartilage Comminuted impacted fractures involving the right humeral head and neck. There is valgus orientation of the fracture fragments. Fracture lines extend to the glenohumeral articular surface with cortical depression at  the articular surface. No apparent dislocation. The visualized right clavicle and right scapula appear intact. Midshaft and distal humerus appear intact.  Moderate joint effusion. Ligaments Suboptimally assessed by CT. Muscles and Tendons Musculature appears intact with mild fatty atrophy. Soft tissues Soft tissue hematoma anterior to the shoulder and proximal right humerus. IMPRESSION: Comminuted impacted fractures involving the right humeral head and neck with valgus orientation of the fracture fragments. Fracture lines extend to the glenohumeral articular surface with cortical depression. Moderate joint effusion. Soft tissue hematoma anterior to the shoulder and proximal right humerus. Electronically Signed   By: Lucienne Capers M.D.   On: 03/21/2020 23:48   DG Shoulder Right Port  Result Date: 04/03/2020 CLINICAL DATA:  Status post right shoulder arthroplasty. EXAM: PORTABLE RIGHT SHOULDER COMPARISON:  March 21, 2020. FINDINGS: The right glenoid and humeral components appear to be well situated. Expected postoperative changes are seen in the surrounding soft tissues. No dislocation is noted. IMPRESSION: Status post right total shoulder arthroplasty. Electronically Signed   By: Marijo Conception M.D.   On: 04/03/2020 17:26   DG Humerus Right  Result Date: 03/21/2020 CLINICAL DATA:  Fall with arm pain EXAM: RIGHT HUMERUS - 2+ VIEW COMPARISON:  None. FINDINGS: AC joint appears intact. Acute proximal humerus fracture peers to involve the head and neck. Mild valgus angulation of distal fracture fragment with impaction and mild displacement at the fracture site. IMPRESSION: Acute proximal right humerus fracture that appears to involve the humeral head and neck. Electronically Signed   By: Donavan Foil M.D.   On: 03/21/2020 19:16    EKG: Orders placed or performed during the hospital encounter of 12/23/19  . EKG 12-Lead immediately post procedure  . EKG 12-Lead  . EKG 12-Lead  . EKG 12-Lead  . EKG  12-Lead immediately post procedure  . EKG 12-Lead  . EKG     Hospital Course: KHALILA BUECHNER is a 69 y.o. who was admitted to Hospital. They were brought to the operating room on 04/03/2020 and underwent Procedure(s): Gas.  Patient tolerated the procedure well and was later transferred to the recovery room and then to the orthopaedic floor for postoperative care.  They were given PO analgesics and a nerve block for pain control following their surgery.  They were given 24 hours of postoperative antibiotics of  Anti-infectives (From admission, onward)   Start     Dose/Rate Route Frequency Ordered Stop   04/03/20 2000  ceFAZolin (ANCEF) IVPB 1 g/50 mL premix        1 g 100 mL/hr over 30 Minutes Intravenous Every 6 hours 04/03/20 1843 04/04/20 0853   04/03/20 1519  vancomycin (VANCOCIN) powder  Status:  Discontinued          As needed 04/03/20 1519 04/03/20 1835   04/03/20 1200  ceFAZolin (ANCEF) IVPB 2g/100 mL premix        2 g 200 mL/hr over 30 Minutes Intravenous On call to O.R. 04/03/20 1150 04/03/20 1416     and started on DVT prophylaxis in the form of aspirin and plavix (home medication) .  OT were ordered for to review sling use and safe ambulation.  Discharge planning consulted to help with postop disposition and equipment needs.  Patient had an uneventful night on the evening of surgery as pain was controlled well via nerve block..  Dressing was changed on day one and the incision was healing with no signs of infection and staples present.  Hemoglobin was monitored after surgery and was stable at 10.9.  Patient was seen in rounds and was ready to go home.   Diet: Regular  diet Activity:NWB to the right upper extremity Follow-up:in 2 weeks Disposition - Home Discharged Condition: good   Discharge Instructions    Call MD / Call 911   Complete by: As directed    If you experience chest pain or shortness of breath, CALL 911 and be transported to the  hospital emergency room.  If you develope a fever above 101 F, pus (white drainage) or increased drainage or redness at the wound, or calf pain, call your surgeon's office.   Constipation Prevention   Complete by: As directed    Drink plenty of fluids.  Prune juice may be helpful.  You may use a stool softener, such as Colace (over the counter) 100 mg twice a day.  Use MiraLax (over the counter) for constipation as needed.   Diet - low sodium heart healthy   Complete by: As directed    Increase activity slowly as tolerated   Complete by: As directed      Allergies as of 04/04/2020   No Known Allergies     Medication List    STOP taking these medications   aspirin EC 81 MG tablet   clopidogrel 75 MG tablet Commonly known as: PLAVIX   HYDROcodone-acetaminophen 10-325 MG tablet Commonly known as: NORCO   metoprolol succinate 25 MG 24 hr tablet Commonly known as: TOPROL-XL   nitroGLYCERIN 0.4 MG SL tablet Commonly known as: NITROSTAT     TAKE these medications   acetaminophen 500 MG tablet Commonly known as: TYLENOL Take 1 tablet (500 mg total) by mouth every 6 (six) hours.   atorvastatin 80 MG tablet Commonly known as: LIPITOR Take 1 tablet (80 mg total) by mouth daily.   citalopram 10 MG tablet Commonly known as: CELEXA Take 10 mg by mouth daily.   clonazePAM 1 MG tablet Commonly known as: KLONOPIN Take 1 mg by mouth at bedtime as needed (restless leg).   docusate sodium 100 MG capsule Commonly known as: COLACE Take 1 capsule (100 mg total) by mouth 2 (two) times daily.   losartan 50 MG tablet Commonly known as: COZAAR Take 50 mg by mouth daily.   ondansetron 4 MG tablet Commonly known as: ZOFRAN Take 1 tablet (4 mg total) by mouth every 6 (six) hours as needed for nausea.   ondansetron 8 MG disintegrating tablet Commonly known as: Zofran ODT Take 1 tablet (8 mg total) by mouth every 8 (eight) hours as needed for nausea or vomiting.     oxyCODONE-acetaminophen 10-325 MG tablet Commonly known as: Percocet Take 1 tablet by mouth every 4 (four) hours as needed for up to 30 doses for pain (Take one tablet every 4 to 6 hours as needed for moderate-severe pain). What changed:   when to take this  reasons to take this   pantoprazole 40 MG tablet Commonly known as: PROTONIX Take 1 tablet (40 mg total) by mouth daily.   Vitamin D (Ergocalciferol) 1.25 MG (50000 UNIT) Caps capsule Commonly known as: DRISDOL Take 50,000 Units by mouth once a week.       Follow-up Information    Nicholes Stairs, MD In 2 weeks.   Specialty: Orthopedic Surgery Why: For suture removal, For wound re-check Contact information: [address] [place] [address] [place] [id number] [phone]               Signed: Jonelle Sidle PA-C Orthopaedic Surgery 04/04/2020, 8:58 AM

## 2020-04-04 NOTE — Discharge Instructions (Signed)
  Orthopedic Discharge Instructions:   Weightbearing: You will be Non-weightbearing meaning no lifting with your right shoulder or hand. You will avoid any active motion of your shoulder as well.  You should work on finger, wrist, and elbow motion as tolerated to prevent stiffness. You should stay in your sling at all times except for showering and your elbow exercises.   Pain Control :  Take Oxycodone every 4-6 hours for moderate to severe pain as directed on the bottle.  Zofran has been sent to your pharmacy for nausea as needed.   Blood Clot Prevention:  Continue to take your aspirin and plavix from prior to surgery.  Wound Care:  Maintain your post-operative dressing for 2 weeks until re-evaluation in clinic. Your dressing is waterproof and you can shower avoiding excessive water exposure of the area.  Do not submerge your incisions in a bath, pool, or lake until you are re-evaluated in the clinic in 2 weeks.   Follow up:  Return to the clinic in 2 weeks with Dr. Stann Mainland or Jonelle Sidle PA-C for staple removal and evaluation.  Please call with any concerns including pain medication refills, fever, spreading redness, or other signs of infection.  [facility] [phone]

## 2020-04-04 NOTE — Plan of Care (Signed)
Patient discharged all care plans met/discontinued. Deanna Artis RN

## 2020-04-04 NOTE — Telephone Encounter (Signed)
Patient states she just got out of the hospital for her shoulder surgery and would like to verify what medication she is supposed to be taking.

## 2020-04-04 NOTE — Progress Notes (Signed)
Subjective: Patient is a 69 year old female 1 day s/p R RTSA for fracture.  Patient reports pain as mild.  Patient reports she is doing well in her sling with minimal pain as her block is still active. She reports decreased sensation in her right thumb and index finger. She denies fever or chills.   Objective:   VITALS:   Vitals:   04/03/20 2232 04/03/20 2330 04/04/20 0316 04/04/20 0635  BP: (!) 142/75 134/83 132/73 (!) 123/96  Pulse: 86 80 80 65  Resp: 16 19 20 17   Temp: 98.4 F (36.9 C) 98.4 F (36.9 C) 97.7 F (36.5 C) 97.7 F (36.5 C)  TempSrc: Oral Oral Oral Oral  SpO2: 94% 94% 95% 99%  Weight:      Height:       Constitutional: General Appearance: healthy-appearing, well-nourished, and well-developed. In hospital bed with sling.  Level of Distress: no acute distress. Eyes: Lens (normal) clear: both eyes. Head: Head: normocephalic and atraumatic. Lungs: Respiratory effort: no dyspnea. Skin: Inspection and palpation: no rash, lesions Neurologic: Cranial Nerves: grossly intact. Sensation: grossly intact. Psychiatric: Insight: good judgement and insight. Mental Status: normal mood and affect and active and alert. Orientation: to time, place, and person.    Right Upper Extremity:   INSPECTION & PALPATION: No gross deformity of the shoulder with tenderness to palpation over the surgical site. Resolving ecchymosis at the distal humerus. Aquacell dressing intact with some blood noted.     SENSORY: sensation is intact to light touch in:  superficial radial nerve distribution (dorsal first web space) median nerve distribution (tip of index finger)  - decreased sensation of thumb/index ulnar nerve distribution (tip of small finger)    Axillary nerve distribution (lateral shoulder) - decreased sensation   ROM: painless passive range of motion of the elbow and wrist. Able to make a composite fist.   MOTOR:  + motor posterior interosseous nerve (thumb IP extension) +  anterior interosseous nerve (thumb IP flexion, index finger DIP flexion) + radial nerve (wrist extension) + median nerve (palpable firing thenar mass) + ulnar nerve (palpable firing of first dorsal interosseous muscle)    VASCULAR: 2+ radial pulse, brisk capillary refill < 2 sec, fingers warm and well-perfused     Lab Results  Component Value Date   WBC 5.7 03/28/2020   HGB 10.9 (L) 04/04/2020   HCT 33.0 (L) 04/04/2020   MCV 94.9 03/28/2020   PLT 184 03/28/2020   BMET    Component Value Date/Time   NA 137 04/04/2020 0328   K 3.7 04/04/2020 0328   CL 108 04/04/2020 0328   CO2 22 04/04/2020 0328   GLUCOSE 171 (H) 04/04/2020 0328   BUN 9 04/04/2020 0328   CREATININE 0.49 04/04/2020 0328   CALCIUM 8.2 (L) 04/04/2020 0328   GFRNONAA >60 04/04/2020 0328   GFRAA >60 12/24/2019 0506     Assessment/Plan: 1 Day Post-Op   Active Problems:   Closed fracture of right proximal humerus   Advance diet Up with therapy Aquacell dressing changed today due to small amount of bleeding.  Pain currently well controlled from block. expected Decreased sensation due to block .  Plan for discharge today around noon.   Weightbearing Status: NWB to the right upper extremity, patient should be in sling at all times including sleeping. She may take it off for hygiene and to work on elbow, wrist, and digit ROM.   DVT Prophylaxis: Patient will continue home regiment of aspirin and plavix  Faythe Casa 04/04/2020, 8:14 AM  Jonelle Sidle PA-C  Physician Assistant with Dr. Lillia Abed [place]

## 2020-04-04 NOTE — Plan of Care (Signed)
  Problem: Education: Goal: Knowledge of General Education information will improve Description: Including pain rating scale, medication(s)/side effects and non-pharmacologic comfort measures Outcome: Progressing   Problem: Clinical Measurements: Goal: Will remain free from infection Outcome: Progressing   Problem: Activity: Goal: Risk for activity intolerance will decrease Outcome: Progressing   

## 2020-04-04 NOTE — Evaluation (Signed)
Occupational Therapy Evaluation Patient Details Name: [name] MRN: [id number] DOB: [birth year] Today's Date: 04/04/2020    History of Present Illness Patient is a 69 year old female that had a fall on 03/21/2020 resulting in a right proximal humerus fracture. Patient s/p R reverse shoulder arthropasty.   Clinical Impression   Mrs. Dyana Magner is a 69 year old woman s/p shoulder replacement without functional use of right dominant upper extremity secondary to effects of surgery, interscalene block and shoulder precautions. Therapist provided education and instruction to patient and cargiver in regards to exercises, precautions, positioning, donning upper extremity clothing and bathing while maintaining shoulder precautions, ice and edema management and donning/doffing sling. Patient and caregiver verbalized understanding of all instruction. Handouts provided to maximize education retention. Patient to follow up with MD for further therapy needs.      Follow Up Recommendations  Follow surgeon's recommendation for DC plan and follow-up therapies    Equipment Recommendations  None recommended by OT    Recommendations for Other Services       Precautions / Restrictions Precautions Precautions: Shoulder Type of Shoulder Precautions: NO AROM, No PROM, okay for elbow, wrist, and hand ROM Shoulder Interventions: Shoulder sling/immobilizer;At all times;Off for dressing/bathing/exercises Required Braces or Orthoses: Sling Restrictions Weight Bearing Restrictions: Yes RUE Weight Bearing: Non weight bearing      Mobility Bed Mobility Overal bed mobility: Modified Independent                Transfers                 General transfer comment: min guard to ambulate in room for safety.    Balance Overall balance assessment: No apparent balance deficits (not formally assessed)                                         ADL either performed or  assessed with clinical judgement   ADL Overall ADL's : Needs assistance/impaired Eating/Feeding: Set up   Grooming: Modified independent   Upper Body Bathing: Minimal assistance;Sitting   Lower Body Bathing: Moderate assistance   Upper Body Dressing : Minimal assistance;Adhering to UE precautions;Sitting   Lower Body Dressing: Minimal assistance;Sit to/from stand   Toilet Transfer: Min guard   [place] and Hygiene: Supervision/safety;Sit to/from stand       Functional mobility during ADLs: Min guard       Vision Patient Visual Report: No change from baseline       Perception     Praxis      Pertinent Vitals/Pain Pain Assessment: No/denies pain     Hand Dominance Right   Extremity/Trunk Assessment Upper Extremity Assessment Upper Extremity Assessment: RUE deficits/detail RUE Deficits / Details: Decreased ROM and strength secondary to block and shoulder precautions. Grossly able to open and close fingers, extend and flex wrist, partial forearm ROM.           Communication Communication Communication: No difficulties   Cognition Arousal/Alertness: Awake/alert Behavior During Therapy: WFL for tasks assessed/performed Overall Cognitive Status: Within Functional Limits for tasks assessed                                     General Comments       Exercises     Shoulder Instructions Shoulder Instructions Donning/doffing shirt without  moving shoulder: Minimal assistance;Patient able to independently direct caregiver;Caregiver independent with task Method for sponge bathing under operated UE: Patient able to independently direct caregiver;Caregiver independent with task Donning/doffing sling/immobilizer: Caregiver independent with task;Patient able to independently direct caregiver Correct positioning of sling/immobilizer: Patient able to independently direct caregiver;Caregiver independent with task ROM for elbow, wrist  and digits of operated UE: Patient able to independently direct caregiver;Caregiver independent with task Sling wearing schedule (on at all times/off for ADL's): Patient able to independently direct caregiver;Caregiver independent with task;Independent Proper positioning of operated UE when showering: Independent;Patient able to independently direct caregiver;Caregiver independent with task Dressing change: Independent;Patient able to independently direct caregiver;Caregiver independent with task Positioning of UE while sleeping: Independent;Patient able to independently direct caregiver;Caregiver independent with task    Home Living Family/patient expects to be discharged to:: Private residence Living Arrangements: Alone Available Help at Discharge: Family;Available 24 hours/day   Home Access: Stairs to enter                                Prior Functioning/Environment Level of Independence: Independent                 OT Problem List: Impaired UE functional use;Decreased strength;Decreased range of motion      OT Treatment/Interventions:      OT Goals(Current goals can be found in the care plan section) Acute Rehab OT Goals Patient Stated Goal: To go hme OT Goal Formulation: All assessment and education complete, DC therapy  OT Frequency:     Barriers to D/C:            Co-evaluation              AM-PAC OT "6 Clicks" Daily Activity     Outcome Measure Help from another person eating meals?: A Little Help from another person taking care of personal grooming?: A Little Help from another person toileting, which includes using toliet, bedpan, or urinal?: None Help from another person bathing (including washing, rinsing, drying)?: A Little Help from another person to put on and taking off regular upper body clothing?: A Little Help from another person to put on and taking off regular lower body clothing?: A Little 6 Click Score: 19   End of Session  Nurse Communication:  (Okay to see per Rn)  Activity Tolerance: Patient tolerated treatment well Patient left: in chair;with call bell/phone within reach;with family/visitor present  OT Visit Diagnosis: Muscle weakness (generalized) (M62.81)                Time: 5188-4166 OT Time Calculation (min): 31 min Charges:  OT General Charges $OT Visit: 1 Visit OT Evaluation $OT Eval Low Complexity: 1 Low OT Treatments $Self Care/Home Management : 8-22 mins  Tajon Moring, OTR/L [place]  Office [phone] Pager: [phone]   Lenward Chancellor 04/04/2020, 10:12 AM

## 2020-04-04 NOTE — Telephone Encounter (Signed)
I just s/w the pt who tells me that the she just had her shoulder surgery today and was told to stop her ASA and Plavix. I read the d/c instructions and I do see in the med list of what not to take it is stated for pt to stop ASA and Plavix. I assured the pt that I will send this note to Dr. Thompson Caul nurse Anderson Malta to d/w further w/Dr. Tamala Julian. Assured the pt Anderson Malta will call her back after she d/w Dr. Tamala Julian in regards to ASA and Plavix. Pt thanked me for the call and the help.

## 2020-04-04 NOTE — Telephone Encounter (Signed)
Spoke with Caryl Pina, RN at [facility] and she said to disregard the discharge summary and that it was not accurate.  She said pt is to continue ASA and Plavix.  Spoke with pt and made her aware.  Pt appreciative for call.   Noticed in note from [place], [place]:  DVT Prophylaxis: Patient will continue home regiment of aspirin and plavix

## 2020-04-06 ENCOUNTER — Encounter: Payer: PPO | Admitting: Physical Therapy

## 2020-04-09 ENCOUNTER — Encounter: Payer: PPO | Admitting: Physical Therapy

## 2020-04-13 ENCOUNTER — Encounter: Payer: PPO | Admitting: Physical Therapy

## 2020-04-16 ENCOUNTER — Encounter: Payer: PPO | Admitting: Physical Therapy

## 2020-04-16 DIAGNOSIS — Z4789 Encounter for other orthopedic aftercare: Secondary | ICD-10-CM | POA: Diagnosis not present

## 2020-04-16 DIAGNOSIS — M7542 Impingement syndrome of left shoulder: Secondary | ICD-10-CM | POA: Diagnosis not present

## 2020-04-16 NOTE — Progress Notes (Signed)
CARDIOLOGY OFFICE NOTE  Date:  04/30/2020    [name] Date of Birth: [birth year] Medical Record #[id number]  PCP:  [name], DO  Cardiologist:  Tamala Julian  Chief Complaint  Patient presents with  . Follow-up    Seen for Dr. Tamala Julian    History of Present Illness: [name] is a 69 y.o. female who presents today for a follow up visit. Seen for Dr. Tamala Julian.   She has a history of HTN, obesity, HLD, DM type II and known CAD with prior DES stent to the LCX in 12/2019. Intolerant of Brilinta due to dyspnea. Transitioned over to Plavix. Did have late femoral bleed - had small radial artery that required femoral approach.    Last seen in July by Dr. Tamala Julian - quite anxious at her post hospital visit. No recurrent angina.   Comes in today. Here alone. She had a fall about 6 weeks ago - broke her right shoulder - has had surgery with her being on DAPT. Slow recovery - starting PT tomorrow. She is still using some narcotic. She is bruising.  Has been doing some walking - she is just now getting out more. No chest pain. Not short of breath.    Past Medical History:  Diagnosis Date  . 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 Chronic pain syndrome 07/09/2017  . Coronary artery disease   . Depression   . Diabetes ([place]) 12/02/2013   no current meds   . Dyspnea on exertion 09/20/2015  . Gastro-esophageal reflux   . GERD (gastroesophageal reflux disease) 05/25/2012  . Hyperlipidemia   . Hypertension   . Hypothyroidism 05/25/2012   Last Assessment & Plan:  States was started after a partial thyroidectomy but does not recall any abnormal lab value and no symptoms.  Discussed as is on low dose, would be reasonable to discontinue and recheck in 6-8 weeks which she would like to do. Last Assessment & Plan:  States was started after a partial thyroidectomy but does not recall any abnormal lab value and no symptoms.  Discussed as   . Lumbar spondylosis  07/09/2017  . Memory disorder 10/30/2015  . Obesity, morbid, BMI 40.0-49.9 ([place]) 06/02/2014  . Osteoporosis 05/02/2013   Overview:  DEXA 11/14 DEXA 11/14  . Restless legs syndrome 05/25/2012   Last Assessment & Plan:  patient reports using clonazepam about once weekly .  Discussed nonpharmacologic tips for treating RLS.  States other medicatiosn she had treid had not been effective (does not remember name) Will refill for now.  Advised if use increases, will readdress in future  . RLS (restless legs syndrome)   . Thyroid disease   . 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 URI, acute 09/20/2015  . Vitamin B 12 deficiency   . Vitamin D deficiency 05/25/2012    Past Surgical History:  Procedure Laterality Date  . ABDOMINAL HYSTERECTOMY    . APPENDECTOMY    . CATARACT EXTRACTION    . CHOLECYSTECTOMY    . CORONARY STENT INTERVENTION N/A 12/23/2019   Procedure: CORONARY STENT INTERVENTION;  Surgeon: Belva Crome, MD;  Location: [facility];  Service: Cardiovascular;  Laterality: N/A;  . JOINT REPLACEMENT     2 total knee replacement  . LASIK    . LEFT HEART CATH AND CORONARY ANGIOGRAPHY N/A 12/23/2019   Procedure: LEFT HEART CATH AND CORONARY ANGIOGRAPHY;  Surgeon: Belva Crome, MD;  Location: [facility];  Service: Cardiovascular;  Laterality: N/A;  . REVERSE SHOULDER ARTHROPLASTY Right 04/03/2020   Procedure: REVERSE SHOULDER ARTHROPLASTY;  Surgeon: Nicholes Stairs, MD;  Location: [facility];  Service: Orthopedics;  Laterality: Right;  2.5 hrs  . THYROIDECTOMY    . TONSILLECTOMY       Medications: Current Meds  Medication Sig  . acetaminophen (TYLENOL) 500 MG tablet Take 1 tablet (500 mg total) by mouth every 6 (six) hours.  Marland Kitchen aspirin EC 81 MG tablet Take 81 mg by mouth daily. Swallow whole.  Marland Kitchen atorvastatin (LIPITOR) 80 MG tablet Take 1 tablet (80 mg total) by mouth daily.  . clonazePAM (KLONOPIN) 1 MG tablet Take 1 mg by mouth at  bedtime as needed (restless leg).   . clopidogrel (PLAVIX) 75 MG tablet Take 75 mg by mouth daily.  Marland Kitchen docusate sodium (COLACE) 100 MG capsule Take 1 capsule (100 mg total) by mouth 2 (two) times daily.  Marland Kitchen losartan (COZAAR) 50 MG tablet Take 50 mg by mouth daily.   . metoprolol succinate (TOPROL-XL) 25 MG 24 hr tablet Take 25 mg by mouth daily.  . ondansetron (ZOFRAN ODT) 8 MG disintegrating tablet Take 1 tablet (8 mg total) by mouth every 8 (eight) hours as needed for nausea or vomiting.  Marland Kitchen oxyCODONE-acetaminophen (PERCOCET) 10-325 MG tablet Take 1 tablet by mouth every 4 (four) hours as needed for up to 30 doses for pain (Take one tablet every 4 to 6 hours as needed for moderate-severe pain).  . pantoprazole (PROTONIX) 40 MG tablet Take 1 tablet (40 mg total) by mouth daily.  . Vitamin D, Ergocalciferol, (DRISDOL) 1.25 MG (50000 UNIT) CAPS capsule Take 50,000 Units by mouth once a week.      Allergies: No Known Allergies  Social History: The patient  reports that she quit smoking about 33 years ago. Her smoking use included cigarettes. She has never used smokeless tobacco. She reports that she does not drink alcohol and does not use drugs.   Family History: The patient's family history includes Cancer in her father and mother; Diabetes in her brother, brother, and sister; Heart disease in her brother; Hypertension in her mother.   Review of Systems: Please see the history of present illness.   All other systems are reviewed and negative.   Physical Exam: VS:  BP 130/60   Pulse 64   Ht 5\' 2"  (1.575 m)   Wt 247 lb (112 kg)   SpO2 98%   BMI 45.18 kg/m  .  BMI Body mass index is 45.18 kg/m.  Wt Readings from Last 3 Encounters:  04/30/20 247 lb (112 kg)  04/03/20 252 lb (114.3 kg)  03/28/20 259 lb (117.5 kg)    General: Pleasant. Alert and in no acute distress. She is down 10 pounds since July. Remains obese.  Cardiac: Regular rate and rhythm. No murmurs, rubs, or gallops. No  edema.  Respiratory:  Lungs are clear to auscultation bilaterally with normal work of breathing.  GI: Soft and nontender.  MS: No deformity or atrophy. Gait and ROM intact.  Skin: Warm and dry. Color is normal.  Neuro:  Strength and sensation are intact and no gross focal deficits noted.  Psych: Alert, appropriate and with normal affect.   LABORATORY DATA:  EKG:  EKG is not ordered today.    Lab Results  Component Value Date   WBC 5.7 03/28/2020   HGB 10.9 (L) 04/04/2020  HCT 33.0 (L) 04/04/2020   PLT 184 03/28/2020   GLUCOSE 171 (H) 04/04/2020   CHOL 133 02/03/2020   TRIG 75 02/03/2020   HDL 67 02/03/2020   LDLCALC 51 02/03/2020   ALT 14 02/03/2020   AST 14 02/03/2020   NA 137 04/04/2020   K 3.7 04/04/2020   CL 108 04/04/2020   CREATININE 0.49 04/04/2020   BUN 9 04/04/2020   CO2 22 04/04/2020   TSH 0.97 03/21/2016   HGBA1C 6.2 (H) 03/28/2020     BNP (last 3 results) No results for input(s): BNP in the last 8760 hours.  ProBNP (last 3 results) No results for input(s): PROBNP in the last 8760 hours.   Other Studies Reviewed Today:  CORONARY STENT INTERVENTION 12/2019  LEFT HEART CATH AND CORONARY ANGIOGRAPHY  Conclusion   2-week history of progressive new onset angina.  99% large first diagonal treated with 2.75 x 15 Onyx postdilated to 3.0 mm and 0% stenosis.  Left main is widely patent  LAD contains mid eccentric 50% stenosis.  Circumflex gives 1 large tortuous obtuse marginal and is free of disease.  Dominant with luminal irregularities in the mid body.  No significant obstruction.  Very mild mid anterior wall hypokinesis.  EF 50 to 55% which is mildly reduced for female.  LVEDP is 19 mmHg.  RECOMMENDATIONS:   Dual antiplatelet therapy for at least 6 months.  Initially aspirin and Brilinta.  After 4 to 6 weeks Brilinta could be decreased in intensity to clopidogrel plus aspirin.  High intensity statin therapy added.  Low-dose beta-blocker  therapy added.  Overnight observation since we have both radial and femoral access during the procedure.    ASSESSMENT & PLAN:    1. CAD - s/p prior PCI - remains on DAPT. No worrisome symptoms. Needs aggressive CV risk factor modification - has had set back with her shoulder fracture and subsequent surgery.   2. HTN - BP looks good. No changes made today.   3. HLD - on statin therapy. LDL is at goal.   4. Obesity - weight is down 10 pounds - encouragement is given.   5. Anxiety - does not really seem to be an issue.   6. Shoulder fracture with subsequent surgery. Starting PT tomorrow.   7. Mild anemia - ?blood loss from prior surgery - will recheck today. She is bruising - she denies any active bleeding noted.   Current medicines are reviewed with the patient today.  The patient does not have concerns regarding medicines other than what has been noted above.  The following changes have been made:  See above.  Labs/ tests ordered today include:    Orders Placed This Encounter  Procedures  . Basic metabolic panel  . CBC     Disposition:   FU with Dr. Tamala Julian in 3 to 4 months.    Patient is agreeable to this plan and will call if any problems develop in the interim.   SignedTruitt Merle, NP  04/30/2020 12:16 PM  [facility] [address] [place], [place]  [address] Phone: [phone] Fax: [phone]

## 2020-04-20 ENCOUNTER — Encounter: Payer: PPO | Admitting: Physical Therapy

## 2020-04-24 ENCOUNTER — Ambulatory Visit: Payer: PPO | Attending: Orthopedic Surgery | Admitting: Physical Therapy

## 2020-04-24 ENCOUNTER — Encounter: Payer: Self-pay | Admitting: Physical Therapy

## 2020-04-24 ENCOUNTER — Ambulatory Visit: Payer: PPO | Admitting: Physical Therapy

## 2020-04-24 ENCOUNTER — Other Ambulatory Visit: Payer: Self-pay

## 2020-04-24 DIAGNOSIS — M25611 Stiffness of right shoulder, not elsewhere classified: Secondary | ICD-10-CM | POA: Diagnosis not present

## 2020-04-24 DIAGNOSIS — R600 Localized edema: Secondary | ICD-10-CM | POA: Insufficient documentation

## 2020-04-24 DIAGNOSIS — M25511 Pain in right shoulder: Secondary | ICD-10-CM | POA: Diagnosis not present

## 2020-04-24 DIAGNOSIS — R252 Cramp and spasm: Secondary | ICD-10-CM

## 2020-04-24 NOTE — Therapy (Signed)
[place] [place], [place], [address] Phone: [phone]   Fax:  [phone]  Physical Therapy Evaluation  Patient Details  Name: [name] MRN: [id number] Date of Birth: 10-03-1950 Referring Provider (PT): Victorino December   Encounter Date: 04/24/2020   PT End of Session - 04/24/20 1000    Visit Number 1    Date for PT Re-Evaluation 06/24/20    Authorization Type [employer]    PT Start Time 0925    PT Stop Time 1005    PT Time Calculation (min) 40 min    Activity Tolerance Patient limited by pain    Behavior During Therapy [facility] for tasks assessed/performed           Past Medical History:  Diagnosis Date  . 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 Chronic pain syndrome 07/09/2017  . Coronary artery disease   . Depression   . Diabetes ([place]) 12/02/2013   no current meds   . Dyspnea on exertion 09/20/2015  . Gastro-esophageal reflux   . GERD (gastroesophageal reflux disease) 05/25/2012  . Hyperlipidemia   . Hypertension   . Hypothyroidism 05/25/2012   Last Assessment & Plan:  States was started after a partial thyroidectomy but does not recall any abnormal lab value and no symptoms.  Discussed as is on low dose, would be reasonable to discontinue and recheck in 6-8 weeks which she would like to do. Last Assessment & Plan:  States was started after a partial thyroidectomy but does not recall any abnormal lab value and no symptoms.  Discussed as   . Lumbar spondylosis 07/09/2017  . Memory disorder 10/30/2015  . Obesity, morbid, BMI 40.0-49.9 ([place]) 06/02/2014  . Osteoporosis 05/02/2013   Overview:  DEXA 11/14 DEXA 11/14  . Restless legs syndrome 05/25/2012   Last Assessment & Plan:  patient reports using clonazepam about once weekly .  Discussed nonpharmacologic tips for treating RLS.  States other medicatiosn she had treid had not been effective (does not remember name)  Will refill for now.  Advised if use increases, will readdress in future  . RLS (restless legs syndrome)   . Thyroid disease   . 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 URI, acute 09/20/2015  . Vitamin B 12 deficiency   . Vitamin D deficiency 05/25/2012    Past Surgical History:  Procedure Laterality Date  . ABDOMINAL HYSTERECTOMY    . APPENDECTOMY    . CATARACT EXTRACTION    . CHOLECYSTECTOMY    . CORONARY STENT INTERVENTION N/A 12/23/2019   Procedure: CORONARY STENT INTERVENTION;  Surgeon: Belva Crome, MD;  Location: [place] [facility];  Service: Cardiovascular;  Laterality: N/A;  . JOINT REPLACEMENT     2 total knee replacement  . LASIK    . LEFT HEART CATH AND CORONARY ANGIOGRAPHY N/A 12/23/2019   Procedure: LEFT HEART CATH AND CORONARY ANGIOGRAPHY;  Surgeon: Belva Crome, MD;  Location: [facility];  Service: Cardiovascular;  Laterality: N/A;  . REVERSE SHOULDER ARTHROPLASTY Right 04/03/2020   Procedure: REVERSE SHOULDER ARTHROPLASTY;  Surgeon: Nicholes Stairs, MD;  Location: [facility];  Service: Orthopedics;  Laterality: Right;  2.5 hrs  . THYROIDECTOMY    . TONSILLECTOMY      There were no vitals filed for this visit.    Subjective Assessment - 04/24/20 0931    Subjective  Patient had a fall on September 29th, she reports that she fell on the right shoulder.  She sustained fractures of the right humerus.  She was in a sling for a few weeks.  She underwent right reverse total shoulder arthroplasty on 04/03/20.  She has been in the sling since that time, she does a few exercises that the MD gave her.  She is right handed    Limitations Lifting;Writing;House hold activities    Patient Stated Goals have less pain, dress and do hair without difficulty, have better motions    Currently in Pain? Yes    Pain Score 0-No pain    Pain Location Shoulder    Pain Orientation Right    Pain Descriptors / Indicators Aching;Sore;Tightness     Pain Type Acute pain;Surgical pain    Pain Onset 1 to 4 weeks ago    Pain Frequency Constant    Aggravating Factors  without pain medication, with movements pain up to 6/10    Pain Relieving Factors pain medication, in sling, not moving pain can be 0/10    Effect of Pain on Daily Activities all ADL's are difficult, has had to have help with dressing and all other ADL's until today              [facility] PT Assessment - 04/24/20 0001      Assessment   Medical Diagnosis S/P right reveres total shoulder arthroplasty    Referring Provider (PT) Victorino December    Onset Date/Surgical Date 04/03/20    Hand Dominance Right    Prior Therapy no      Precautions   Precautions Shoulder    Type of Shoulder Precautions reverse total shoulder      Balance Screen   Has the patient fallen in the past 6 months Yes    How many times? 1    Has the patient had a decrease in activity level because of a fear of falling?  Yes    Is the patient reluctant to leave their home because of a fear of falling?  No      Home Environment   Additional Comments does housework, cooks and cleans      Prior Function   Level of Independence Independent    Vocation Part time employment    Vocation Requirements mostly on the computer    Leisure no exercise      Posture/Postural Control   Posture Comments fwd head, rounded shoulders, right shoulder elevated due to guarding      AROM   AROM Assessment Site Elbow    Right/Left Elbow Right    Right Elbow Flexion 110    Right Elbow Extension 28      PROM   PROM Assessment Site Elbow    Right/Left Shoulder Right    Right Shoulder Flexion 80 Degrees    Right Shoulder ABduction 40 Degrees    Right Shoulder Internal Rotation 30 Degrees    Right Shoulder External Rotation 0 Degrees    Right/Left Elbow Right    Right Elbow Flexion 120    Right Elbow Extension 20      Strength   Overall Strength Comments not tested due to recent surgery      Palpation   Palpation  comment scar anterior shoulder is slightly rigid, mild tenderness here and int he bicep, bicep is very tight , some guarding in the upper trap area  Objective measurements completed on examination: See above findings.                 PT Short Term Goals - 04/24/20 1004      PT SHORT TERM GOAL #1   Title independent with initial HEP    Time 2    Period Weeks    Status New             PT Long Term Goals - 04/24/20 1004      PT LONG TERM GOAL #1   Title understand posture and body mechanics and the protocol for her surgery    Time 8    Period Weeks    Status New      PT LONG TERM GOAL #2   Title decrease pain overall 50%    Time 8    Period Weeks    Status New      PT LONG TERM GOAL #3   Title increase AROM of right shoulder flexion to 130 degrees    Time 8    Period Weeks    Status New      PT LONG TERM GOAL #4   Title report able to do seatbelt wihtout pain >3/10    Time 8    Period Weeks    Status New      PT LONG TERM GOAL #5   Title report no difficulty dressing and doing hair    Time 8    Period Weeks    Status New                  Plan - 04/24/20 1001    Clinical Impression Statement Patient had a fall 03/21/20 sustaining fractures of the right proximal humerus, she underwent a right reverse total shoulder arthroplasty on 04/03/20.  She is right handed.  She is in a sling.  She has some bruising still int he elbow area, she has guarding in the right biceps and the upper trap, as well as the pectoral area, all are tender, the scar has some rigidity to it.  She is painful and limited with PROM.    Stability/Clinical Decision Making Evolving/Moderate complexity    Clinical Decision Making Low    Rehab Potential Good    PT Frequency 2x / week    PT Duration 8 weeks    PT Treatment/Interventions ADLs/Self Care Home Management;Cryotherapy;Electrical Stimulation;Iontophoresis 4mg /ml Dexamethasone;Moist  Heat;Ultrasound;Therapeutic exercise;Neuromuscular re-education;Patient/family education;Manual techniques;Vasopneumatic Device;Therapeutic activities    PT Next Visit Plan slowly work on ROM    Consulted and Agree with Plan of Care Patient           Patient will benefit from skilled therapeutic intervention in order to improve the following deficits and impairments:  Decreased range of motion, Increased muscle spasms, Impaired UE functional use, Decreased endurance, Decreased activity tolerance, Pain, Improper body mechanics, Postural dysfunction, Decreased strength, Decreased scar mobility, Impaired flexibility, Increased edema  Visit Diagnosis: Acute pain of right shoulder - Plan: PT plan of care cert/re-cert  Stiffness of right shoulder, not elsewhere classified - Plan: PT plan of care cert/re-cert  Cramp and spasm - Plan: PT plan of care cert/re-cert  Localized edema - Plan: PT plan of care cert/re-cert     Problem List Patient Active Problem List   Diagnosis Date Noted  . Closed fracture of right proximal humerus 04/03/2020  . Groin hematoma 12/24/2019  . Angina pectoris ([place]) 12/23/2019  . Long-term current use of opiate analgesic 07/09/2017  . Chronic  pain syndrome 07/09/2017  . Lumbar spondylosis 07/09/2017  . 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 Memory disorder 10/30/2015  . 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 Dyspnea on exertion 09/20/2015  . Unstable angina ([place]) 09/20/2015  . URI, acute 09/20/2015  . Obesity, morbid, BMI 40.0-49.9 ([place]) 06/02/2014  . Hypertension   . Hyperlipidemia   . Diabetes ([place]) 12/02/2013  . Depression 05/27/2013  . Osteoporosis 05/02/2013  . GERD (gastroesophageal reflux disease) 05/25/2012  . Hypothyroidism 05/25/2012  . Restless legs syndrome 05/25/2012  . Vitamin D deficiency 05/25/2012    Sumner Boast., PT 04/24/2020, 10:07 AM  [place]. [place], [place],  [address] Phone: [phone]   Fax:  [phone]  Name: LONIE RUMMELL MRN: [id number] Date of Birth: 11/12/1950

## 2020-04-24 NOTE — Patient Instructions (Signed)
Access Code: [email or web address] URL: [email or web address] Date: 04/24/2020 Prepared by: Lum Babe  Exercises Standing Elbow Flexion Extension AROM - 2 x daily - 7 x weekly - 1 sets - 10 reps - 3 hold Seated Shoulder Shrugs - 2 x daily - 7 x weekly - 1 sets - 10 reps - 3 hold Seated Scapular Retraction - 2 x daily - 7 x weekly - 1 sets - 10 reps - 3 hold Seated Shoulder External Rotation PROM on Table - 2 x daily - 7 x weekly - 1 sets - 10 reps - 10 hold Seated Shoulder Flexion Slide at Table Top with Forearm in Neutral - 2 x daily - 7 x weekly - 1 sets - 10 reps - 10 hold

## 2020-04-30 ENCOUNTER — Other Ambulatory Visit: Payer: Self-pay

## 2020-04-30 ENCOUNTER — Encounter: Payer: Self-pay | Admitting: Nurse Practitioner

## 2020-04-30 ENCOUNTER — Ambulatory Visit: Payer: PPO | Admitting: Nurse Practitioner

## 2020-04-30 VITALS — BP 130/60 | HR 64 | Ht 62.0 in | Wt 247.0 lb

## 2020-04-30 DIAGNOSIS — E785 Hyperlipidemia, unspecified: Secondary | ICD-10-CM

## 2020-04-30 DIAGNOSIS — I25110 Atherosclerotic heart disease of native coronary artery with unstable angina pectoris: Secondary | ICD-10-CM

## 2020-04-30 DIAGNOSIS — I10 Essential (primary) hypertension: Secondary | ICD-10-CM

## 2020-04-30 NOTE — Patient Instructions (Addendum)
After Visit Summary:  We will be checking the following labs today - BMET and CBC   Medication Instructions:    Continue with your current medicines.    If you need a refill on your cardiac medications before your next appointment, please call your pharmacy.     Testing/Procedures To Be Arranged:  N/A  Follow-Up:   See Dr. Tamala Julian in 3 to 4 months.     At [facility], you and your health needs are our priority.  As part of our continuing mission to provide you with exceptional heart care, we have created designated Provider Care Teams.  These Care Teams include your primary Cardiologist (physician) and Advanced Practice Providers (APPs -  Physician Assistants and Nurse Practitioners) who all work together to provide you with the care you need, when you need it.  Special Instructions:  . Stay safe, wash your hands for at least 20 seconds and wear a mask when needed.  . It was good to talk with you today.    Call the [place] office at [phone] if you have any questions, problems or concerns.

## 2020-05-01 ENCOUNTER — Ambulatory Visit: Payer: PPO | Admitting: Physical Therapy

## 2020-05-01 ENCOUNTER — Encounter: Payer: Self-pay | Admitting: Physical Therapy

## 2020-05-01 DIAGNOSIS — M25511 Pain in right shoulder: Secondary | ICD-10-CM | POA: Diagnosis not present

## 2020-05-01 DIAGNOSIS — M25611 Stiffness of right shoulder, not elsewhere classified: Secondary | ICD-10-CM

## 2020-05-01 DIAGNOSIS — R600 Localized edema: Secondary | ICD-10-CM

## 2020-05-01 LAB — CBC
Hematocrit: 39.2 % (ref 34.0–46.6)
Hemoglobin: 13.2 g/dL (ref 11.1–15.9)
MCH: 30.4 pg (ref 26.6–33.0)
MCHC: 33.7 g/dL (ref 31.5–35.7)
MCV: 90 fL (ref 79–97)
Platelets: 214 10*3/uL (ref 150–450)
RBC: 4.34 x10E6/uL (ref 3.77–5.28)
RDW: 13.5 % (ref 11.7–15.4)
WBC: 7.2 10*3/uL (ref 3.4–10.8)

## 2020-05-01 LAB — BASIC METABOLIC PANEL
BUN/Creatinine Ratio: 21 (ref 12–28)
BUN: 13 mg/dL (ref 8–27)
CO2: 23 mmol/L (ref 20–29)
Calcium: 8.6 mg/dL — ABNORMAL LOW (ref 8.7–10.3)
Chloride: 108 mmol/L — ABNORMAL HIGH (ref 96–106)
Creatinine, Ser: 0.62 mg/dL (ref 0.57–1.00)
GFR calc Af Amer: 105 mL/min/{1.73_m2} (ref >59)
GFR calc non Af Amer: 91 mL/min/{1.73_m2} (ref >59)
Glucose: 100 mg/dL — ABNORMAL HIGH (ref 65–99)
Potassium: 4 mmol/L (ref 3.5–5.2)
Sodium: 143 mmol/L (ref 134–144)

## 2020-05-01 NOTE — Therapy (Signed)
[place]. [place], [place], [address] Phone: [phone]   Fax:  [phone]  Physical Therapy Treatment  Patient Details  Name: [name] MRN: [id number] Date of Birth: 05/30/1951 Referring Provider (PT): Victorino December   Encounter Date: 05/01/2020   PT End of Session - 05/01/20 1049    Visit Number 2    Date for PT Re-Evaluation 06/24/20    PT Start Time 0017    PT Stop Time 1055    PT Time Calculation (min) 40 min    Activity Tolerance Patient limited by pain    Behavior During Therapy [facility] for tasks assessed/performed           Past Medical History:  Diagnosis Date  . 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 Chronic pain syndrome 07/09/2017  . Coronary artery disease   . Depression   . Diabetes ([place]) 12/02/2013   no current meds   . Dyspnea on exertion 09/20/2015  . Gastro-esophageal reflux   . GERD (gastroesophageal reflux disease) 05/25/2012  . Hyperlipidemia   . Hypertension   . Hypothyroidism 05/25/2012   Last Assessment & Plan:  States was started after a partial thyroidectomy but does not recall any abnormal lab value and no symptoms.  Discussed as is on low dose, would be reasonable to discontinue and recheck in 6-8 weeks which she would like to do. Last Assessment & Plan:  States was started after a partial thyroidectomy but does not recall any abnormal lab value and no symptoms.  Discussed as   . Lumbar spondylosis 07/09/2017  . Memory disorder 10/30/2015  . Obesity, morbid, BMI 40.0-49.9 ([place]) 06/02/2014  . Osteoporosis 05/02/2013   Overview:  DEXA 11/14 DEXA 11/14  . Restless legs syndrome 05/25/2012   Last Assessment & Plan:  patient reports using clonazepam about once weekly .  Discussed nonpharmacologic tips for treating RLS.  States other medicatiosn she had treid had not been effective (does not remember name) Will refill for now.  Advised if use  increases, will readdress in future  . RLS (restless legs syndrome)   . Thyroid disease   . 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 URI, acute 09/20/2015  . Vitamin B 12 deficiency   . Vitamin D deficiency 05/25/2012    Past Surgical History:  Procedure Laterality Date  . ABDOMINAL HYSTERECTOMY    . APPENDECTOMY    . CATARACT EXTRACTION    . CHOLECYSTECTOMY    . CORONARY STENT INTERVENTION N/A 12/23/2019   Procedure: CORONARY STENT INTERVENTION;  Surgeon: Belva Crome, MD;  Location: [facility];  Service: Cardiovascular;  Laterality: N/A;  . JOINT REPLACEMENT     2 total knee replacement  . LASIK    . LEFT HEART CATH AND CORONARY ANGIOGRAPHY N/A 12/23/2019   Procedure: LEFT HEART CATH AND CORONARY ANGIOGRAPHY;  Surgeon: Belva Crome, MD;  Location: [facility];  Service: Cardiovascular;  Laterality: N/A;  . REVERSE SHOULDER ARTHROPLASTY Right 04/03/2020   Procedure: REVERSE SHOULDER ARTHROPLASTY;  Surgeon: Nicholes Stairs, MD;  Location: [facility];  Service: Orthopedics;  Laterality: Right;  2.5 hrs  . THYROIDECTOMY    . TONSILLECTOMY      There were no vitals filed for this visit.   Subjective Assessment - 05/01/20 1046    Subjective Said her shoulder is doing well with minimal  pain. Did take a pain pill before coming to PT today. Expressed she has a major fear of falling again because of her first fall.    Currently in Pain? No/denies                             [facility] Adult PT Treatment/Exercise - 05/01/20 0001      Modalities   Modalities Vasopneumatic   R shoulder, 10 mins     Vasopneumatic   Number Minutes Vasopneumatic  10 minutes    Vasopnuematic Location  Shoulder   Right   Vasopneumatic Pressure Medium    Vasopneumatic Temperature  36      Manual Therapy   Manual Therapy Passive ROM    Manual therapy comments R shoulder all directions to tolerance                    PT Short  Term Goals - 04/24/20 1004      PT SHORT TERM GOAL #1   Title independent with initial HEP    Time 2    Period Weeks    Status New             PT Long Term Goals - 04/24/20 1004      PT LONG TERM GOAL #1   Title understand posture and body mechanics and the protocol for her surgery    Time 8    Period Weeks    Status New      PT LONG TERM GOAL #2   Title decrease pain overall 50%    Time 8    Period Weeks    Status New      PT LONG TERM GOAL #3   Title increase AROM of right shoulder flexion to 130 degrees    Time 8    Period Weeks    Status New      PT LONG TERM GOAL #4   Title report able to do seatbelt wihtout pain >3/10    Time 8    Period Weeks    Status New      PT LONG TERM GOAL #5   Title report no difficulty dressing and doing hair    Time 8    Period Weeks    Status New                 Plan - 05/01/20 1050    Clinical Impression Statement Patient's pain increased to a 6/10 during PROM. She was gaurded and needed multiple reminders to relax her arm throughout. She is limited in all directions with PROM specifically ER and abduction.    PT Treatment/Interventions ADLs/Self Care Home Management;Cryotherapy;Electrical Stimulation;Iontophoresis 4mg /ml Dexamethasone;Moist Heat;Ultrasound;Therapeutic exercise;Neuromuscular re-education;Patient/family education;Manual techniques;Vasopneumatic Device;Therapeutic activities    PT Next Visit Plan [place] to work on ROM           Patient will benefit from skilled therapeutic intervention in order to improve the following deficits and impairments:  Decreased range of motion, Increased muscle spasms, Impaired UE functional use, Decreased endurance, Decreased activity tolerance, Pain, Improper body mechanics, Postural dysfunction, Decreased strength, Decreased scar mobility, Impaired flexibility, Increased edema  Visit Diagnosis: Acute pain of right shoulder  Localized edema  Stiffness of right  shoulder, not elsewhere classified     Problem List Patient Active Problem List   Diagnosis Date Noted  . Closed fracture of right proximal humerus 04/03/2020  . Groin hematoma 12/24/2019  . Angina pectoris (  [place]) 12/23/2019  . Long-term current use of opiate analgesic 07/09/2017  . Chronic pain syndrome 07/09/2017  . Lumbar spondylosis 07/09/2017  . 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 Memory disorder 10/30/2015  . 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 Dyspnea on exertion 09/20/2015  . Unstable angina ([place]) 09/20/2015  . URI, acute 09/20/2015  . Obesity, morbid, BMI 40.0-49.9 ([place]) 06/02/2014  . Hypertension   . Hyperlipidemia   . Diabetes ([place]) 12/02/2013  . Depression 05/27/2013  . Osteoporosis 05/02/2013  . GERD (gastroesophageal reflux disease) 05/25/2012  . Hypothyroidism 05/25/2012  . Restless legs syndrome 05/25/2012  . Vitamin D deficiency 05/25/2012    Lavenia Atlas, SPTA 05/01/2020, 10:52 AM  [place]. [place], [place], [address] Phone: [phone]   Fax:  [phone]  Name: INGER WIEST MRN: [id number] Date of Birth: 1951-02-15

## 2020-05-04 ENCOUNTER — Encounter: Payer: Self-pay | Admitting: Physical Therapy

## 2020-05-04 ENCOUNTER — Other Ambulatory Visit: Payer: Self-pay

## 2020-05-04 ENCOUNTER — Ambulatory Visit: Payer: PPO | Admitting: Physical Therapy

## 2020-05-04 DIAGNOSIS — R600 Localized edema: Secondary | ICD-10-CM

## 2020-05-04 DIAGNOSIS — M25511 Pain in right shoulder: Secondary | ICD-10-CM

## 2020-05-04 DIAGNOSIS — M25611 Stiffness of right shoulder, not elsewhere classified: Secondary | ICD-10-CM

## 2020-05-04 DIAGNOSIS — R252 Cramp and spasm: Secondary | ICD-10-CM

## 2020-05-04 NOTE — Therapy (Signed)
[place]. [place], [place], [address] Phone: [phone]   Fax:  [phone]  Physical Therapy Treatment  Patient Details  Name: [name] MRN: [id number] Date of Birth: June 25, 1950 Referring Provider (PT): Victorino December   Encounter Date: 05/04/2020   PT End of Session - 05/04/20 1135    Visit Number 3    Date for PT Re-Evaluation 06/24/20    PT Start Time 1100    PT Stop Time 1141    PT Time Calculation (min) 41 min    Activity Tolerance Patient limited by pain    Behavior During Therapy [facility] for tasks assessed/performed           Past Medical History:  Diagnosis Date  . 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 Chronic pain syndrome 07/09/2017  . Coronary artery disease   . Depression   . Diabetes ([place]) 12/02/2013   no current meds   . Dyspnea on exertion 09/20/2015  . Gastro-esophageal reflux   . GERD (gastroesophageal reflux disease) 05/25/2012  . Hyperlipidemia   . Hypertension   . Hypothyroidism 05/25/2012   Last Assessment & Plan:  States was started after a partial thyroidectomy but does not recall any abnormal lab value and no symptoms.  Discussed as is on low dose, would be reasonable to discontinue and recheck in 6-8 weeks which she would like to do. Last Assessment & Plan:  States was started after a partial thyroidectomy but does not recall any abnormal lab value and no symptoms.  Discussed as   . Lumbar spondylosis 07/09/2017  . Memory disorder 10/30/2015  . Obesity, morbid, BMI 40.0-49.9 ([place]) 06/02/2014  . Osteoporosis 05/02/2013   Overview:  DEXA 11/14 DEXA 11/14  . Restless legs syndrome 05/25/2012   Last Assessment & Plan:  patient reports using clonazepam about once weekly .  Discussed nonpharmacologic tips for treating RLS.  States other medicatiosn she had treid had not been effective (does not remember name) Will refill for now.  Advised if use  increases, will readdress in future  . RLS (restless legs syndrome)   . Thyroid disease   . 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 URI, acute 09/20/2015  . Vitamin B 12 deficiency   . Vitamin D deficiency 05/25/2012    Past Surgical History:  Procedure Laterality Date  . ABDOMINAL HYSTERECTOMY    . APPENDECTOMY    . CATARACT EXTRACTION    . CHOLECYSTECTOMY    . CORONARY STENT INTERVENTION N/A 12/23/2019   Procedure: CORONARY STENT INTERVENTION;  Surgeon: Belva Crome, MD;  Location: [place] [facility];  Service: Cardiovascular;  Laterality: N/A;  . JOINT REPLACEMENT     2 total knee replacement  . LASIK    . LEFT HEART CATH AND CORONARY ANGIOGRAPHY N/A 12/23/2019   Procedure: LEFT HEART CATH AND CORONARY ANGIOGRAPHY;  Surgeon: Belva Crome, MD;  Location: [place] [facility];  Service: Cardiovascular;  Laterality: N/A;  . REVERSE SHOULDER ARTHROPLASTY Right 04/03/2020   Procedure: REVERSE SHOULDER ARTHROPLASTY;  Surgeon: Nicholes Stairs, MD;  [facility];  Service: Orthopedics;  Laterality: Right;  2.5 hrs  . THYROIDECTOMY    . TONSILLECTOMY      There were no vitals filed for this visit.   Subjective Assessment - 05/04/20 1103    Subjective Feeling good today. Reports exercises are going well  at home. Some pain in elbow when she flexes it. Thinks pain is related to how she slept last night.    Currently in Pain? Yes    Pain Score 5                              [facility] Adult PT Treatment/Exercise - 05/04/20 0001      Modalities   Modalities Vasopneumatic      Vasopneumatic   Number Minutes Vasopneumatic  10 minutes    Vasopnuematic Location  Shoulder   Right   Vasopneumatic Pressure Medium    Vasopneumatic Temperature  36      Manual Therapy   Manual Therapy Passive ROM    Manual therapy comments R shoulder all directions to tolerance                    PT Short Term Goals - 05/04/20 1138       PT SHORT TERM GOAL #1   Title independent with initial HEP    Status Achieved             PT Long Term Goals - 05/04/20 1138      PT LONG TERM GOAL #1   Title understand posture and body mechanics and the protocol for her surgery    Status Achieved      PT LONG TERM GOAL #2   Title decrease pain overall 50%    Baseline 20% decrease since surgery    Status Partially Met      PT LONG TERM GOAL #3   Title increase AROM of right shoulder flexion to 130 degrees    Status On-going      PT LONG TERM GOAL #4   Title report able to do seatbelt wihtout pain >3/10    Status On-going      PT LONG TERM GOAL #5   Title report no difficulty dressing and doing hair    Status On-going                 Plan - 05/04/20 1135    Clinical Impression Statement Patient is very gaurded and needs many reminders to relax during PROM. With verbal cuing and tactile inhibition she is able to relax somewhat but still really struggles to. ER and abduction are still particularly painful for her.    PT Treatment/Interventions ADLs/Self Care Home Management;Cryotherapy;Electrical Stimulation;Iontophoresis 67m/ml Dexamethasone;Moist Heat;Ultrasound;Therapeutic exercise;Neuromuscular re-education;Patient/family education;Manual techniques;Vasopneumatic Device;Therapeutic activities    PT Next Visit Plan [place] to work on ROM per protocol           Patient will benefit from skilled therapeutic intervention in order to improve the following deficits and impairments:  Decreased range of motion, Increased muscle spasms, Impaired UE functional use, Decreased endurance, Decreased activity tolerance, Pain, Improper body mechanics, Postural dysfunction, Decreased strength, Decreased scar mobility, Impaired flexibility, Increased edema  Visit Diagnosis: No diagnosis found.     Problem List Patient Active Problem List   Diagnosis Date Noted  . Closed fracture of right proximal humerus 04/03/2020   . Groin hematoma 12/24/2019  . Angina pectoris ([place] 12/23/2019  . Long-term current use of opiate analgesic 07/09/2017  . Chronic pain syndrome 07/09/2017  . Lumbar spondylosis 07/09/2017  . 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 Memory disorder 10/30/2015  . 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 Dyspnea on exertion 09/20/2015  . Unstable angina ([place] 09/20/2015  . URI, acute 09/20/2015  . Obesity, morbid, BMI  40.0-49.9 ([place]) 06/02/2014  . Hypertension   . Hyperlipidemia   . Diabetes ([place]) 12/02/2013  . Depression 05/27/2013  . Osteoporosis 05/02/2013  . GERD (gastroesophageal reflux disease) 05/25/2012  . Hypothyroidism 05/25/2012  . Restless legs syndrome 05/25/2012  . Vitamin D deficiency 05/25/2012    Lavenia Atlas, SPTA 05/04/2020, 11:40 AM  [place]. [place], [place], [address] Phone: [phone]   Fax:  [phone]  Name: KENAE LINDQUIST MRN: [id number] Date of Birth: 05-27-1951

## 2020-05-08 ENCOUNTER — Other Ambulatory Visit: Payer: Self-pay

## 2020-05-08 ENCOUNTER — Encounter: Payer: Self-pay | Admitting: Physical Therapy

## 2020-05-08 ENCOUNTER — Ambulatory Visit: Payer: PPO | Admitting: Physical Therapy

## 2020-05-08 DIAGNOSIS — M25511 Pain in right shoulder: Secondary | ICD-10-CM | POA: Diagnosis not present

## 2020-05-08 DIAGNOSIS — R600 Localized edema: Secondary | ICD-10-CM

## 2020-05-08 NOTE — Therapy (Signed)
[place]. [place], [place], [address] Phone: [phone]   Fax:  [phone]  Physical Therapy Treatment  Patient Details  Name: [name] MRN: [id number] Date of Birth: 02/24/1951 Referring Provider (PT): Victorino December   Encounter Date: 05/08/2020   PT End of Session - 05/08/20 1056    Visit Number 4    Date for PT Re-Evaluation 06/24/20    PT Start Time 1015    PT Stop Time 1100    PT Time Calculation (min) 45 min    Activity Tolerance Patient limited by pain    Behavior During Therapy [facility] for tasks assessed/performed           Past Medical History:  Diagnosis Date   Anosmia 10/30/2015   Atypical facial pain 10/30/2015   Left upper face   Chronic pain syndrome 07/09/2017   Coronary artery disease    Depression    Diabetes ([place]) 12/02/2013   no current meds    Dyspnea on exertion 09/20/2015   Gastro-esophageal reflux    GERD (gastroesophageal reflux disease) 05/25/2012   Hyperlipidemia    Hypertension    Hypothyroidism 05/25/2012   Last Assessment & Plan:  States was started after a partial thyroidectomy but does not recall any abnormal lab value and no symptoms.  Discussed as is on low dose, would be reasonable to discontinue and recheck in 6-8 weeks which she would like to do. Last Assessment & Plan:  States was started after a partial thyroidectomy but does not recall any abnormal lab value and no symptoms.  Discussed as    Lumbar spondylosis 07/09/2017   Memory disorder 10/30/2015   Obesity, morbid, BMI 40.0-49.9 ([place]) 06/02/2014   Osteoporosis 05/02/2013   Overview:  DEXA 11/14 DEXA 11/14   Restless legs syndrome 05/25/2012   Last Assessment & Plan:  patient reports using clonazepam about once weekly .  Discussed nonpharmacologic tips for treating RLS.  States other medicatiosn she had treid had not been effective (does not remember name) Will refill for now.  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   URI, acute 09/20/2015   Vitamin B 12 deficiency    Vitamin D deficiency 05/25/2012    Past Surgical History:  Procedure Laterality Date   ABDOMINAL HYSTERECTOMY     APPENDECTOMY     CATARACT EXTRACTION     CHOLECYSTECTOMY     CORONARY STENT INTERVENTION N/A 12/23/2019   Procedure: CORONARY STENT INTERVENTION;  Surgeon: Belva Crome, MD;  Location: [place] [facility];  Service: Cardiovascular;  Laterality: N/A;   JOINT REPLACEMENT     2 total knee replacement   LASIK     LEFT HEART CATH AND CORONARY ANGIOGRAPHY N/A 12/23/2019   Procedure: LEFT HEART CATH AND CORONARY ANGIOGRAPHY;  Surgeon: Belva Crome, MD;  Location: [place] [facility];  Service: Cardiovascular;  Laterality: N/A;   REVERSE SHOULDER ARTHROPLASTY Right 04/03/2020   Procedure: REVERSE SHOULDER ARTHROPLASTY;  Surgeon: Nicholes Stairs, MD;  [facility];  Service: Orthopedics;  Laterality: Right;  2.5 hrs   THYROIDECTOMY     TONSILLECTOMY      There were no vitals filed for this visit.   Subjective Assessment - 05/08/20 1019    Subjective Feeling good. Reported trying to pay attention to  armswing while walking at home without the sling on.    Currently in Pain? No/denies                             [facility] Adult PT Treatment/Exercise - 05/08/20 0001      Exercises   Exercises Shoulder      Shoulder Exercises: Stretch   Other Shoulder Stretches UT str. #2 DB 5x10" ea      Modalities   Modalities Vasopneumatic      Vasopneumatic   Number Minutes Vasopneumatic  10 minutes    Vasopnuematic Location  Shoulder    Vasopneumatic Pressure Medium    Vasopneumatic Temperature  34      Manual Therapy   Manual Therapy Passive ROM    Manual therapy comments R shoulder all directions to tolerance, soft tissue on  insertion of biceps                     PT Short Term Goals - 05/04/20 1138      PT SHORT TERM GOAL #1   Title independent with initial HEP    Status Achieved             PT Long Term Goals - 05/04/20 1138      PT LONG TERM GOAL #1   Title understand posture and body mechanics and the protocol for her surgery    Status Achieved      PT LONG TERM GOAL #2   Title decrease pain overall 50%    Baseline 20% decrease since surgery    Status Partially Met      PT LONG TERM GOAL #3   Title increase AROM of right shoulder flexion to 130 degrees    Status On-going      PT LONG TERM GOAL #4   Title report able to do seatbelt wihtout pain >3/10    Status On-going      PT LONG TERM GOAL #5   Title report no difficulty dressing and doing hair    Status On-going                 Plan - 05/08/20 1056    Clinical Impression Statement Patient remains very gaurded and unable to relax during PROM. Was able to get patient to relax a little with some gentle soft tissue work to biceps insertion area as well as some ther ex activities per protocol.    PT Treatment/Interventions ADLs/Self Care Home Management;Cryotherapy;Electrical Stimulation;Iontophoresis 92m/ml Dexamethasone;Moist Heat;Ultrasound;Therapeutic exercise;Neuromuscular re-education;Patient/family education;Manual techniques;Vasopneumatic Device;Therapeutic activities    PT Next Visit Plan [place] to work on ROM per protocol           Patient will benefit from skilled therapeutic intervention in order to improve the following deficits and impairments:  Decreased range of motion, Increased muscle spasms, Impaired UE functional use, Decreased endurance, Decreased activity tolerance, Pain, Improper body mechanics, Postural dysfunction, Decreased strength, Decreased scar mobility, Impaired flexibility, Increased edema  Visit Diagnosis: Acute pain of right shoulder  Localized edema     Problem List Patient  Active Problem List   Diagnosis Date Noted   Closed fracture of right proximal humerus 04/03/2020   Groin hematoma 12/24/2019   Ang[place] pectoris ([place] 12/23/2019   Long-term current use of opiate analgesic 07/09/2017   Chronic pain syndrome 07/09/2017   Lumbar spondylosis 07/09/2017   Atypical facial pain 10/30/2015   Memory disorder 10/30/2015   Anosmia 10/30/2015  Dyspnea on exertion 09/20/2015   Unstable angina ([place]) 09/20/2015   URI, acute 09/20/2015   Obesity, morbid, BMI 40.0-49.9 ([place]) 06/02/2014   Hypertension    Hyperlipidemia    Diabetes ([place]) 12/02/2013   Depression 05/27/2013   Osteoporosis 05/02/2013   GERD (gastroesophageal reflux disease) 05/25/2012   Hypothyroidism 05/25/2012   Restless legs syndrome 05/25/2012   Vitamin D deficiency 05/25/2012    Lavenia Atlas, SPTA 05/08/2020, 11:02 AM  [place]. [place], [place], [address] Phone: [phone]   Fax:  [phone]  Name: LAPORSHIA HOGEN MRN: [id number] Date of Birth: 1950/10/13

## 2020-05-11 ENCOUNTER — Other Ambulatory Visit: Payer: Self-pay

## 2020-05-11 ENCOUNTER — Ambulatory Visit: Payer: PPO | Admitting: Physical Therapy

## 2020-05-11 ENCOUNTER — Encounter: Payer: Self-pay | Admitting: Physical Therapy

## 2020-05-11 DIAGNOSIS — M25511 Pain in right shoulder: Secondary | ICD-10-CM

## 2020-05-11 DIAGNOSIS — R252 Cramp and spasm: Secondary | ICD-10-CM

## 2020-05-11 DIAGNOSIS — R600 Localized edema: Secondary | ICD-10-CM

## 2020-05-11 DIAGNOSIS — M25611 Stiffness of right shoulder, not elsewhere classified: Secondary | ICD-10-CM

## 2020-05-11 NOTE — Patient Instructions (Signed)
Access Code: [email or web address] URL: [email or web address] Date: 05/11/2020 Prepared by: Lum Babe  Exercises Supine Shoulder Flexion AAROM with Hands Clasped - 1 x daily - 7 x weekly - 1 sets - 10 reps - 3 hold Seated Shoulder External Rotation AAROM with Dowel - 1 x daily - 7 x weekly - 1 sets - 10 reps - 3 hold Seated Shoulder Abduction AAROM with Dowel - 1 x daily - 7 x weekly - 1 sets - 10 reps - 3 hold Standing Bilateral Shoulder Internal Rotation AAROM with Dowel - 1 x daily - 7 x weekly - 1 sets - 10 reps - 3 hold Standing Shoulder Extension with Dowel - 1 x daily - 7 x weekly - 1 sets - 10 reps - 30 hold

## 2020-05-11 NOTE — Therapy (Signed)
[place]. [place], [place], [address] Phone: [phone]   Fax:  [phone]  Physical Therapy Treatment  Patient Details  Name: [name] MRN: [id number] Date of Birth: [birth year] Referring Provider (PT): Victorino December   Encounter Date: 05/11/2020   PT End of Session - 05/11/20 1034    Visit Number 5    Date for PT Re-Evaluation 06/24/20    Authorization Type [employer]    PT Start Time 1157    PT Stop Time 1045    PT Time Calculation (min) 50 min    Activity Tolerance Patient tolerated treatment well    Behavior During Therapy [facility] for tasks assessed/performed           Past Medical History:  Diagnosis Date   Anosmia 10/30/2015   Atypical facial pain 10/30/2015   Left upper face   Chronic pain syndrome 07/09/2017   Coronary artery disease    Depression    Diabetes ([place]) 12/02/2013   no current meds    Dyspnea on exertion 09/20/2015   Gastro-esophageal reflux    GERD (gastroesophageal reflux disease) 05/25/2012   Hyperlipidemia    Hypertension    Hypothyroidism 05/25/2012   Last Assessment & Plan:  States was started after a partial thyroidectomy but does not recall any abnormal lab value and no symptoms.  Discussed as is on low dose, would be reasonable to discontinue and recheck in 6-8 weeks which she would like to do. Last Assessment & Plan:  States was started after a partial thyroidectomy but does not recall any abnormal lab value and no symptoms.  Discussed as    Lumbar spondylosis 07/09/2017   Memory disorder 10/30/2015   Obesity, morbid, BMI 40.0-49.9 ([place]) 06/02/2014   Osteoporosis 05/02/2013   Overview:  DEXA 11/14 DEXA 11/14   Restless legs syndrome 05/25/2012   Last Assessment & Plan:  patient reports using clonazepam about once weekly .  Discussed nonpharmacologic tips for treating RLS.  States other medicatiosn she had treid had not been effective (does not  remember name) Will refill for now.  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   URI, acute 09/20/2015   Vitamin B 12 deficiency    Vitamin D deficiency 05/25/2012    Past Surgical History:  Procedure Laterality Date   ABDOMINAL HYSTERECTOMY     APPENDECTOMY     CATARACT EXTRACTION     CHOLECYSTECTOMY     CORONARY STENT INTERVENTION N/A 12/23/2019   Procedure: CORONARY STENT INTERVENTION;  Surgeon: Belva Crome, MD;  Location: [place] [facility];  Service: Cardiovascular;  Laterality: N/A;   JOINT REPLACEMENT     2 total knee replacement   LASIK     LEFT HEART CATH AND CORONARY ANGIOGRAPHY N/A 12/23/2019   Procedure: LEFT HEART CATH AND CORONARY ANGIOGRAPHY;  Surgeon: Belva Crome, MD;  Location: [place] [facility];  Service: Cardiovascular;  Laterality: N/A;   REVERSE SHOULDER ARTHROPLASTY Right 04/03/2020   Procedure: REVERSE SHOULDER ARTHROPLASTY;  Surgeon: Nicholes Stairs, MD;  [facility];  Service: Orthopedics;  Laterality: Right;  2.5 hrs   THYROIDECTOMY     TONSILLECTOMY      There were no vitals filed for this visit.   Subjective Assessment - 05/11/20 1002    Subjective I  am doing oaky, sleeping in the bed again  the exercises are not too painful    Currently in Pain? No/denies              [facility] PT Assessment - 05/11/20 0001      PROM   Right Shoulder Flexion 105 Degrees    Right Shoulder ABduction 90 Degrees    Right Shoulder Internal Rotation 60 Degrees    Right Shoulder External Rotation 38 Degrees                         [facility] Adult PT Treatment/Exercise - 05/11/20 0001      Shoulder Exercises: Supine   Flexion AAROM;20 reps      Modalities   Modalities Vasopneumatic      Vasopneumatic   Number Minutes Vasopneumatic  10 minutes    Vasopnuematic Location  Shoulder     Vasopneumatic Pressure Medium    Vasopneumatic Temperature  34      Manual Therapy   Manual Therapy Passive ROM    Manual therapy comments R shoulder all directions to tolerance, soft tissue on insertion of biceps     Passive ROM all GH motions to pain                    PT Short Term Goals - 05/04/20 1138      PT SHORT TERM GOAL #1   Title independent with initial HEP    Status Achieved             PT Long Term Goals - 05/11/20 1036      PT LONG TERM GOAL #1   Title understand posture and body mechanics and the protocol for her surgery    Status Achieved      PT LONG TERM GOAL #2   Title decrease pain overall 50%    Status Partially Met      PT LONG TERM GOAL #3   Title increase AROM of right shoulder flexion to 130 degrees    Status On-going      PT LONG TERM GOAL #4   Title report able to do seatbelt wihtout pain >3/10    Status On-going                 Plan - 05/11/20 1034    Clinical Impression Statement Patient has made great increase in PROM, we are about at the protocol limits, I added AAROM today and she did well with this, with PROM her biggest issue is inability to relax, I think with the AAROM this may help her move better.    PT Next Visit Plan see how the AAROM does    Consulted and Agree with Plan of Care Patient           Patient will benefit from skilled therapeutic intervention in order to improve the following deficits and impairments:  Decreased range of motion, Increased muscle spasms, Impaired UE functional use, Decreased endurance, Decreased activity tolerance, Pain, Improper body mechanics, Postural dysfunction, Decreased strength, Decreased scar mobility, Impaired flexibility, Increased edema  Visit Diagnosis: Acute pain of right shoulder  Localized edema  Stiffness of right shoulder, not elsewhere classified  Cramp and spasm     Problem List Patient Active Problem List   Diagnosis Date Noted   Closed  fracture of right proximal humerus 04/03/2020   Groin hematoma 12/24/2019   Angina pectoris ([place]) 12/23/2019   Long-term current use of opiate  analgesic 07/09/2017   Chronic pain syndrome 07/09/2017   Lumbar spondylosis 07/09/2017   Atypical facial pain 10/30/2015   Memory disorder 10/30/2015   Anosmia 10/30/2015   Dyspnea on exertion 09/20/2015   Unstable angina ([place]) 09/20/2015   URI, acute 09/20/2015   Obesity, morbid, BMI 40.0-49.9 ([place]) 06/02/2014   Hypertension    Hyperlipidemia    Diabetes ([place]) 12/02/2013   Depression 05/27/2013   Osteoporosis 05/02/2013   GERD (gastroesophageal reflux disease) 05/25/2012   Hypothyroidism 05/25/2012   Restless legs syndrome 05/25/2012   Vitamin D deficiency 05/25/2012    Sumner Boast., PT 05/11/2020, 10:37 AM  [place]. [place], [place], [address] Phone: [phone]   Fax:  [phone]  Name: [name] MRN: [id number] Date of Birth: 1950/11/06

## 2020-05-15 ENCOUNTER — Ambulatory Visit: Payer: PPO | Admitting: Physical Therapy

## 2020-05-15 ENCOUNTER — Encounter: Payer: Self-pay | Admitting: Physical Therapy

## 2020-05-15 ENCOUNTER — Other Ambulatory Visit: Payer: Self-pay

## 2020-05-15 DIAGNOSIS — R252 Cramp and spasm: Secondary | ICD-10-CM

## 2020-05-15 DIAGNOSIS — R600 Localized edema: Secondary | ICD-10-CM

## 2020-05-15 DIAGNOSIS — M25511 Pain in right shoulder: Secondary | ICD-10-CM

## 2020-05-15 DIAGNOSIS — M25611 Stiffness of right shoulder, not elsewhere classified: Secondary | ICD-10-CM

## 2020-05-15 NOTE — Therapy (Signed)
[facility] [address] [place], [place], [address] Phone: [phone]   Fax:  [phone]  Physical Therapy Treatment  Patient Details  Name: [name] MRN: [id number] Date of Birth: 04/04/1951 Referring Provider (PT): Jason Rogers   Encounter Date: 05/15/2020   PT End of Session - 05/15/20 1048    Visit Number 6    Date for PT Re-Evaluation 06/24/20    Authorization Type [employer]    PT Start Time 1001    PT Stop Time 1100    PT Time Calculation (min) 59 min    Activity Tolerance Patient tolerated treatment well    Behavior During Therapy WFL for tasks assessed/performed           Past Medical History:  Diagnosis Date  . 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 Chronic pain syndrome 07/09/2017  . Coronary artery disease   . Depression   . Diabetes (HCC) 12/02/2013   no current meds   . Dyspnea on exertion 09/20/2015  . Gastro-esophageal reflux   . GERD (gastroesophageal reflux disease) 05/25/2012  . Hyperlipidemia   . Hypertension   . Hypothyroidism 05/25/2012   Last Assessment & Plan:  States was started after a partial thyroidectomy but does not recall any abnormal lab value and no symptoms.  Discussed as is on low dose, would be reasonable to discontinue and recheck in 6-8 weeks which she would like to do. Last Assessment & Plan:  States was started after a partial thyroidectomy but does not recall any abnormal lab value and no symptoms.  Discussed as   . Lumbar spondylosis 07/09/2017  . Memory disorder 10/30/2015  . Obesity, morbid, BMI 40.0-49.9 (HCC) 06/02/2014  . Osteoporosis 05/02/2013   Overview:  DEXA 11/14 DEXA 11/14  . Restless legs syndrome 05/25/2012   Last Assessment & Plan:  patient reports using clonazepam about once weekly .  Discussed nonpharmacologic tips for treating RLS.  States other medicatiosn she had treid had not been effective (does not  remember name) Will refill for now.  Advised if use increases, will readdress in future  . RLS (restless legs syndrome)   . Thyroid disease   . 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 URI, acute 09/20/2015  . Vitamin B 12 deficiency   . Vitamin D deficiency 05/25/2012    Past Surgical History:  Procedure Laterality Date  . ABDOMINAL HYSTERECTOMY    . APPENDECTOMY    . CATARACT EXTRACTION    . CHOLECYSTECTOMY    . CORONARY STENT INTERVENTION N/A 12/23/2019   Procedure: CORONARY STENT INTERVENTION;  Surgeon: Smith, Henry W, MD;  [facility];  Service: Cardiovascular;  Laterality: N/A;  . JOINT REPLACEMENT     2 total knee replacement  . LASIK    . LEFT HEART CATH AND CORONARY ANGIOGRAPHY N/A 12/23/2019   Procedure: LEFT HEART CATH AND CORONARY ANGIOGRAPHY;  Surgeon: Smith, Henry W, MD;  Location: [facility];  Service: Cardiovascular;  Laterality: N/A;  . REVERSE SHOULDER ARTHROPLASTY Right 04/03/2020   Procedure: REVERSE SHOULDER ARTHROPLASTY;  Surgeon: Rogers, Jason Patrick, MD;  Location: [facility];  Service: Orthopedics;  Laterality: Right;  2.5 hrs  . THYROIDECTOMY    . TONSILLECTOMY      There were no vitals filed for this visit.   Subjective Assessment - 05/15/20 1017    Subjective I   have had a sinus issue and HA over hte psat few days, have not been doing the exercises    Currently in Pain? No/denies              [facility] PT Assessment - 05/15/20 0001      PROM   Right Shoulder Flexion 130 Degrees                         [facility] Adult PT Treatment/Exercise - 05/15/20 0001      Shoulder Exercises: Supine   External Rotation AAROM;Right;20 reps    External Rotation Limitations with cane    Flexion AAROM;20 reps      Shoulder Exercises: Standing   Internal Rotation AAROM;20 reps    Internal Rotation Limitations behind back with cane    ABduction AAROM;20 reps    ABduction Limitations with cane     Extension AAROM;20 reps    Extension Limitations with cane    Other Standing Exercises ball rolling out and circles    Other Standing Exercises biceps with cane, triceps hand at chest elbow tucked in yelloe tband      Shoulder Exercises: Isometric Strengthening   Extension 3X5"    ADduction 3X5"      Vasopneumatic   Number Minutes Vasopneumatic  10 minutes    Vasopnuematic Location  Shoulder    Vasopneumatic Pressure Medium    Vasopneumatic Temperature  36      Manual Therapy   Manual Therapy Passive ROM    Manual therapy comments R shoulder all directions to tolerance, soft tissue on insertion of biceps     Passive ROM all GH motions to pain                    PT Short Term Goals - 05/04/20 1138      PT SHORT TERM GOAL #1   Title independent with initial HEP    Status Achieved             PT Long Term Goals - 05/15/20 1052      PT LONG TERM GOAL #2   Title decrease pain overall 50%    Status Partially Met      PT LONG TERM GOAL #3   Title increase AROM of right shoulder flexion to 130 degrees    Status On-going                 Plan - 05/15/20 1050    Clinical Impression Statement Patient reports that she has not been feeling well, has not done the exercises, reports feeling stiff and a little sore, I am starting some isometrics and AAROM, she seems to do well with her being in control and doing the AAROM, the PROM today was measured in supine with her clasping her hands and it was much improved from last week passively    PT Next Visit Plan slowly continue to progress with the protocol    Consulted and Agree with Plan of Care Patient           Patient will benefit from skilled therapeutic intervention in order to improve the following deficits and impairments:  Decreased range of motion, Increased muscle spasms, Impaired UE functional use, Decreased endurance, Decreased activity tolerance, Pain, Improper body mechanics, Postural dysfunction,  Decreased strength, Decreased scar mobility, Impaired flexibility, Increased edema  Visit Diagnosis: Acute pain of right shoulder  Localized edema  Stiffness of right shoulder, not elsewhere classified    Cramp and spasm     Problem List Patient Active Problem List   Diagnosis Date Noted  . Closed fracture of right proximal humerus 04/03/2020  . Groin hematoma 12/24/2019  . Angina pectoris (HCC) 12/23/2019  . Long-term current use of opiate analgesic 07/09/2017  . Chronic pain syndrome 07/09/2017  . Lumbar spondylosis 07/09/2017  . 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 Memory disorder 10/30/2015  . 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 Dyspnea on exertion 09/20/2015  . Unstable angina (HCC) 09/20/2015  . URI, acute 09/20/2015  . Obesity, morbid, BMI 40.0-49.9 (HCC) 06/02/2014  . Hypertension   . Hyperlipidemia   . Diabetes (HCC) 12/02/2013  . Depression 05/27/2013  . Osteoporosis 05/02/2013  . GERD (gastroesophageal reflux disease) 05/25/2012  . Hypothyroidism 05/25/2012  . Restless legs syndrome 05/25/2012  . Vitamin D deficiency 05/25/2012    ALBRIGHT,MICHAEL W., PT 05/15/2020, 10:53 AM  [facility] [address] [place], [place], [address] Phone: [phone]   Fax:  [phone]  Name: [name] MRN: [id number] Date of Birth: 01/08/1951   

## 2020-05-21 ENCOUNTER — Encounter: Payer: Self-pay | Admitting: Physical Therapy

## 2020-05-21 ENCOUNTER — Other Ambulatory Visit: Payer: Self-pay

## 2020-05-21 ENCOUNTER — Ambulatory Visit: Payer: PPO | Admitting: Physical Therapy

## 2020-05-21 DIAGNOSIS — M25511 Pain in right shoulder: Secondary | ICD-10-CM

## 2020-05-21 DIAGNOSIS — M25611 Stiffness of right shoulder, not elsewhere classified: Secondary | ICD-10-CM

## 2020-05-21 DIAGNOSIS — R252 Cramp and spasm: Secondary | ICD-10-CM

## 2020-05-21 DIAGNOSIS — R600 Localized edema: Secondary | ICD-10-CM

## 2020-05-21 NOTE — Therapy (Signed)
[facility]. [place], [place], [address] Phone: [phone]   Fax:  [phone]  Physical Therapy Treatment  Patient Details  Name: [name] MRN: [id number] Date of Birth: February 06, 1951 Referring Provider (PT): Victorino December   Encounter Date: 05/21/2020   PT End of Session - 05/21/20 1049    Visit Number 7    Date for PT Re-Evaluation 06/24/20    Authorization Type [employer]    PT Start Time 1007    PT Stop Time 1105    PT Time Calculation (min) 58 min    Activity Tolerance Patient tolerated treatment well    Behavior During Therapy [facility] for tasks assessed/performed           Past Medical History:  Diagnosis Date  . 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 Chronic pain syndrome 07/09/2017  . Coronary artery disease   . Depression   . Diabetes ([place]) 12/02/2013   no current meds   . Dyspnea on exertion 09/20/2015  . Gastro-esophageal reflux   . GERD (gastroesophageal reflux disease) 05/25/2012  . Hyperlipidemia   . Hypertension   . Hypothyroidism 05/25/2012   Last Assessment & Plan:  States was started after a partial thyroidectomy but does not recall any abnormal lab value and no symptoms.  Discussed as is on low dose, would be reasonable to discontinue and recheck in 6-8 weeks which she would like to do. Last Assessment & Plan:  States was started after a partial thyroidectomy but does not recall any abnormal lab value and no symptoms.  Discussed as   . Lumbar spondylosis 07/09/2017  . Memory disorder 10/30/2015  . Obesity, morbid, BMI 40.0-49.9 ([place]) 06/02/2014  . Osteoporosis 05/02/2013   Overview:  DEXA 11/14 DEXA 11/14  . Restless legs syndrome 05/25/2012   Last Assessment & Plan:  patient reports using clonazepam about once weekly .  Discussed nonpharmacologic tips for treating RLS.  States other medicatiosn she had treid had not been effective (does not  remember name) Will refill for now.  Advised if use increases, will readdress in future  . RLS (restless legs syndrome)   . Thyroid disease   . 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 URI, acute 09/20/2015  . Vitamin B 12 deficiency   . Vitamin D deficiency 05/25/2012    Past Surgical History:  Procedure Laterality Date  . ABDOMINAL HYSTERECTOMY    . APPENDECTOMY    . CATARACT EXTRACTION    . CHOLECYSTECTOMY    . CORONARY STENT INTERVENTION N/A 12/23/2019   Procedure: CORONARY STENT INTERVENTION;  Surgeon: Belva Crome, MD;  Location: [facility];  Service: Cardiovascular;  Laterality: N/A;  . JOINT REPLACEMENT     2 total knee replacement  . LASIK    . LEFT HEART CATH AND CORONARY ANGIOGRAPHY N/A 12/23/2019   Procedure: LEFT HEART CATH AND CORONARY ANGIOGRAPHY;  Surgeon: Belva Crome, MD;  Location: [facility];  Service: Cardiovascular;  Laterality: N/A;  . REVERSE SHOULDER ARTHROPLASTY Right 04/03/2020   Procedure: REVERSE SHOULDER ARTHROPLASTY;  Surgeon: Nicholes Stairs, MD;  Location: [facility];  Service: Orthopedics;  Laterality: Right;  2.5 hrs  . THYROIDECTOMY    . TONSILLECTOMY      There were no vitals filed for this visit.   Subjective Assessment - 05/21/20 1009    Subjective Not  too much pain    Currently in Pain? Yes    Pain Score 3     Pain Location Shoulder    Pain Orientation Right;Anterior    Aggravating Factors  reaching and moving                             [facility] Adult PT Treatment/Exercise - 05/21/20 0001      Shoulder Exercises: Seated   Extension Both;20 reps;Theraband    Theraband Level (Shoulder Extension) Level 1 (Yellow)    Row Both;20 reps;Theraband    Theraband Level (Shoulder Row) Level 1 (Yellow)    Other Seated Exercises some AAROM/PROM by the PT with her in sitting      Shoulder Exercises: Standing   Internal Rotation AAROM;20 reps    Internal Rotation Limitations  behind back with cane    ABduction AAROM;20 reps    ABduction Limitations with cane    Extension AAROM;20 reps    Extension Limitations with cane    Other Standing Exercises ball rolling out and circles      Shoulder Exercises: ROM/Strengthening   UBE (Upper Arm Bike) level 1 x 4 minutes    Nustep level 3 x 5 minutes    [place] flexion and circles      Shoulder Exercises: Isometric Strengthening   Extension 3X5"    ADduction 3X5"      Vasopneumatic   Number Minutes Vasopneumatic  10 minutes    Vasopnuematic Location  Shoulder    Vasopneumatic Pressure Medium    Vasopneumatic Temperature  36                    PT Short Term Goals - 05/04/20 1138      PT SHORT TERM GOAL #1   Title independent with initial HEP    Status Achieved             PT Long Term Goals - 05/15/20 1052      PT LONG TERM GOAL #2   Title decrease pain overall 50%    Status Partially Met      PT LONG TERM GOAL #3   Title increase AROM of right shoulder flexion to 130 degrees    Status On-going                 Plan - 05/21/20 1050    Clinical Impression Statement Patient is doing well, we are preogressing AAROM/AROM.  Her PROM is at the protocol limits.  She is very sore and tender with the activities but allows the motions, needs cues to not lean and compensate with the upper traps    PT Next Visit Plan slowly continue to progress with the protocol    Consulted and Agree with Plan of Care Patient           Patient will benefit from skilled therapeutic intervention in order to improve the following deficits and impairments:  Decreased range of motion, Increased muscle spasms, Impaired UE functional use, Decreased endurance, Decreased activity tolerance, Pain, Improper body mechanics, Postural dysfunction, Decreased strength, Decreased scar mobility, Impaired flexibility, Increased edema  Visit Diagnosis: Acute pain of right shoulder  Localized edema  Stiffness of right  shoulder, not elsewhere classified  Cramp and spasm     Problem List Patient Active Problem List   Diagnosis Date Noted  . Closed fracture of right proximal humerus 04/03/2020  . Groin hematoma 12/24/2019  . Angina  pectoris ([place]) 12/23/2019  . Long-term current use of opiate analgesic 07/09/2017  . Chronic pain syndrome 07/09/2017  . Lumbar spondylosis 07/09/2017  . 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 Memory disorder 10/30/2015  . 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 Dyspnea on exertion 09/20/2015  . Unstable angina ([place]) 09/20/2015  . URI, acute 09/20/2015  . Obesity, morbid, BMI 40.0-49.9 ([place]) 06/02/2014  . Hypertension   . Hyperlipidemia   . Diabetes ([place]) 12/02/2013  . Depression 05/27/2013  . Osteoporosis 05/02/2013  . GERD (gastroesophageal reflux disease) 05/25/2012  . Hypothyroidism 05/25/2012  . Restless legs syndrome 05/25/2012  . Vitamin D deficiency 05/25/2012    Sumner Boast., PT 05/21/2020, 10:52 AM  [place]. [place], [place], [address] Phone: [phone]   Fax:  [phone]  Name: YAMILETTE GARRETSON MRN: [id number] Date of Birth: [birth year]

## 2020-05-24 ENCOUNTER — Other Ambulatory Visit: Payer: Self-pay

## 2020-05-24 ENCOUNTER — Encounter: Payer: Self-pay | Admitting: Physical Therapy

## 2020-05-24 ENCOUNTER — Ambulatory Visit: Payer: PPO | Attending: Orthopedic Surgery | Admitting: Physical Therapy

## 2020-05-24 DIAGNOSIS — R600 Localized edema: Secondary | ICD-10-CM

## 2020-05-24 DIAGNOSIS — M25611 Stiffness of right shoulder, not elsewhere classified: Secondary | ICD-10-CM | POA: Diagnosis not present

## 2020-05-24 DIAGNOSIS — M25511 Pain in right shoulder: Secondary | ICD-10-CM

## 2020-05-24 DIAGNOSIS — R252 Cramp and spasm: Secondary | ICD-10-CM | POA: Diagnosis not present

## 2020-05-24 NOTE — Therapy (Signed)
[facility]. [place], [place], [address] Phone: [phone]   Fax:  [phone]  Physical Therapy Treatment  Patient Details  Name: [name] MRN: [id number] Date of Birth: April 14, 1951 Referring Provider (PT): Victorino December   Encounter Date: 05/24/2020   PT End of Session - 05/24/20 1027    Visit Number 8    Date for PT Re-Evaluation 06/24/20    PT Start Time 8250    PT Stop Time 1100    PT Time Calculation (min) 45 min    Activity Tolerance Patient tolerated treatment well    Behavior During Therapy [facility] for tasks assessed/performed           Past Medical History:  Diagnosis Date  . 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 Chronic pain syndrome 07/09/2017  . Coronary artery disease   . Depression   . Diabetes ([place]) 12/02/2013   no current meds   . Dyspnea on exertion 09/20/2015  . Gastro-esophageal reflux   . GERD (gastroesophageal reflux disease) 05/25/2012  . Hyperlipidemia   . Hypertension   . Hypothyroidism 05/25/2012   Last Assessment & Plan:  States was started after a partial thyroidectomy but does not recall any abnormal lab value and no symptoms.  Discussed as is on low dose, would be reasonable to discontinue and recheck in 6-8 weeks which she would like to do. Last Assessment & Plan:  States was started after a partial thyroidectomy but does not recall any abnormal lab value and no symptoms.  Discussed as   . Lumbar spondylosis 07/09/2017  . Memory disorder 10/30/2015  . Obesity, morbid, BMI 40.0-49.9 ([place]) 06/02/2014  . Osteoporosis 05/02/2013   Overview:  DEXA 11/14 DEXA 11/14  . Restless legs syndrome 05/25/2012   Last Assessment & Plan:  patient reports using clonazepam about once weekly .  Discussed nonpharmacologic tips for treating RLS.  States other medicatiosn she had treid had not been effective (does not remember name) Will refill for now.  Advised if use  increases, will readdress in future  . RLS (restless legs syndrome)   . Thyroid disease   . 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 URI, acute 09/20/2015  . Vitamin B 12 deficiency   . Vitamin D deficiency 05/25/2012    Past Surgical History:  Procedure Laterality Date  . ABDOMINAL HYSTERECTOMY    . APPENDECTOMY    . CATARACT EXTRACTION    . CHOLECYSTECTOMY    . CORONARY STENT INTERVENTION N/A 12/23/2019   Procedure: CORONARY STENT INTERVENTION;  Surgeon: Belva Crome, MD;  Location: [facility];  Service: Cardiovascular;  Laterality: N/A;  . JOINT REPLACEMENT     2 total knee replacement  . LASIK    . LEFT HEART CATH AND CORONARY ANGIOGRAPHY N/A 12/23/2019   Procedure: LEFT HEART CATH AND CORONARY ANGIOGRAPHY;  Surgeon: Belva Crome, MD;  Location: [facility];  Service: Cardiovascular;  Laterality: N/A;  . REVERSE SHOULDER ARTHROPLASTY Right 04/03/2020   Procedure: REVERSE SHOULDER ARTHROPLASTY;  Surgeon: Nicholes Stairs, MD;  [facility];  Service: Orthopedics;  Laterality: Right;  2.5 hrs  . THYROIDECTOMY    . TONSILLECTOMY      There were no vitals filed for this visit.   Subjective Assessment - 05/24/20 1018    Subjective Feeling good no shoulder pain. Reports some elbow  discomfort.    Currently in Pain? No/denies                             [facility] Adult PT Treatment/Exercise - 05/24/20 0001      Shoulder Exercises: Standing   Extension Both;20 reps;Theraband    Theraband Level (Shoulder Extension) Level 1 (Yellow)    Row Both;20 reps;Theraband    Theraband Level (Shoulder Row) Level 1 (Yellow)    Other Standing Exercises Physioball rolls (CW/CCW/FRWD-BACK/S-S 2x10   L hand over R   Other Standing Exercises Cane Flex/Ext/Scap/IR x10, Tricep ext. x10 (yellow TB)      Shoulder Exercises: ROM/Strengthening   UBE (Upper Arm Bike) L1 2 mins ea way    Nustep L2 74mns      Vasopneumatic    Number Minutes Vasopneumatic  10 minutes    Vasopnuematic Location  Shoulder    Vasopneumatic Pressure Medium    Vasopneumatic Temperature  36      Manual Therapy   Manual Therapy --    Manual therapy comments --    Passive ROM --                    PT Short Term Goals - 05/04/20 1138      PT SHORT TERM GOAL #1   Title independent with initial HEP    Status Achieved             PT Long Term Goals - 05/15/20 1052      PT LONG TERM GOAL #2   Title decrease pain overall 50%    Status Partially Met      PT LONG TERM GOAL #3   Title increase AROM of right shoulder flexion to 130 degrees    Status On-going                 Plan - 05/24/20 1027    Clinical Impression Statement Patient continues to do well with progression of AAROM/AROM interventions. She had no complaints of increased soreness or pain. Needed some verbal cuing to prevent compensations and isolate shoulder movements. Overall progressing well with protocol.    PT Treatment/Interventions ADLs/Self Care Home Management;Cryotherapy;Electrical Stimulation;Iontophoresis 478mml Dexamethasone;Moist Heat;Ultrasound;Therapeutic exercise;Neuromuscular re-education;Patient/family education;Manual techniques;Vasopneumatic Device;Therapeutic activities    PT Next Visit Plan Progress AAROM/AROM, Strengthening with protocol           Patient will benefit from skilled therapeutic intervention in order to improve the following deficits and impairments:  Decreased range of motion, Increased muscle spasms, Impaired UE functional use, Decreased endurance, Decreased activity tolerance, Pain, Improper body mechanics, Postural dysfunction, Decreased strength, Decreased scar mobility, Impaired flexibility, Increased edema  Visit Diagnosis: Localized edema  Stiffness of right shoulder, not elsewhere classified  Acute pain of right shoulder     Problem List Patient Active Problem List   Diagnosis Date Noted  .  Closed fracture of right proximal humerus 04/03/2020  . Groin hematoma 12/24/2019  . Angina pectoris ([facility]07/07/2019  . Long-term current use of opiate analgesic 07/09/2017  . Chronic pain syndrome 07/09/2017  . Lumbar spondylosis 07/09/2017  . 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 Memory disorder 10/30/2015  . 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 Dyspnea on exertion 09/20/2015  . Unstable angina ([facility]03/30/2017  . URI, acute 09/20/2015  . Obesity, morbid, BMI 40.0-49.9 ([facility]12/04/2014  . Hypertension   . Hyperlipidemia   . Diabetes ([facility]06/05/2014  . Depression 05/27/2013  . Osteoporosis 05/02/2013  .  GERD (gastroesophageal reflux disease) 05/25/2012  . Hypothyroidism 05/25/2012  . Restless legs syndrome 05/25/2012  . Vitamin D deficiency 05/25/2012    Lavenia Atlas, SPTA 05/24/2020, 10:59 AM  [facility]. [place], [place], [address] Phone: [phone]   Fax:  [phone]  Name: BENTLY MORATH MRN: [id number] Date of Birth: 03-03-1951

## 2020-05-28 ENCOUNTER — Other Ambulatory Visit: Payer: Self-pay

## 2020-05-28 ENCOUNTER — Encounter: Payer: Self-pay | Admitting: Physical Therapy

## 2020-05-28 ENCOUNTER — Ambulatory Visit: Payer: PPO | Admitting: Physical Therapy

## 2020-05-28 DIAGNOSIS — M25611 Stiffness of right shoulder, not elsewhere classified: Secondary | ICD-10-CM

## 2020-05-28 DIAGNOSIS — R600 Localized edema: Secondary | ICD-10-CM | POA: Diagnosis not present

## 2020-05-28 NOTE — Therapy (Signed)
[place]. [place], [place], [address] Phone: [phone]   Fax:  [phone]  Physical Therapy Treatment  Patient Details  Name: [name] MRN: [id number] Date of Birth: May 09, 1951 Referring Provider (PT): Victorino December   Encounter Date: 05/28/2020   PT End of Session - 05/28/20 0947    Visit Number 9    Date for PT Re-Evaluation 06/24/20    PT Start Time 0930    PT Stop Time 1019    PT Time Calculation (min) 49 min    Activity Tolerance Patient tolerated treatment well    Behavior During Therapy [facility] for tasks assessed/performed           Past Medical History:  Diagnosis Date  . 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 Chronic pain syndrome 07/09/2017  . Coronary artery disease   . Depression   . Diabetes ([place]) 12/02/2013   no current meds   . Dyspnea on exertion 09/20/2015  . Gastro-esophageal reflux   . GERD (gastroesophageal reflux disease) 05/25/2012  . Hyperlipidemia   . Hypertension   . Hypothyroidism 05/25/2012   Last Assessment & Plan:  States was started after a partial thyroidectomy but does not recall any abnormal lab value and no symptoms.  Discussed as is on low dose, would be reasonable to discontinue and recheck in 6-8 weeks which she would like to do. Last Assessment & Plan:  States was started after a partial thyroidectomy but does not recall any abnormal lab value and no symptoms.  Discussed as   . Lumbar spondylosis 07/09/2017  . Memory disorder 10/30/2015  . Obesity, morbid, BMI 40.0-49.9 ([place]) 06/02/2014  . Osteoporosis 05/02/2013   Overview:  DEXA 11/14 DEXA 11/14  . Restless legs syndrome 05/25/2012   Last Assessment & Plan:  patient reports using clonazepam about once weekly .  Discussed nonpharmacologic tips for treating RLS.  States other medicatiosn she had treid had not been effective (does not remember name) Will refill for now.  Advised if use  increases, will readdress in future  . RLS (restless legs syndrome)   . Thyroid disease   . 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 URI, acute 09/20/2015  . Vitamin B 12 deficiency   . Vitamin D deficiency 05/25/2012    Past Surgical History:  Procedure Laterality Date  . ABDOMINAL HYSTERECTOMY    . APPENDECTOMY    . CATARACT EXTRACTION    . CHOLECYSTECTOMY    . CORONARY STENT INTERVENTION N/A 12/23/2019   Procedure: CORONARY STENT INTERVENTION;  Surgeon: Belva Crome, MD;  Location: [place] [facility];  Service: Cardiovascular;  Laterality: N/A;  . JOINT REPLACEMENT     2 total knee replacement  . LASIK    . LEFT HEART CATH AND CORONARY ANGIOGRAPHY N/A 12/23/2019   Procedure: LEFT HEART CATH AND CORONARY ANGIOGRAPHY;  Surgeon: Belva Crome, MD;  Location: [facility] [facility];  Service: Cardiovascular;  Laterality: N/A;  . REVERSE SHOULDER ARTHROPLASTY Right 04/03/2020   Procedure: REVERSE SHOULDER ARTHROPLASTY;  Surgeon: Nicholes Stairs, MD;  Location: [facility];  Service: Orthopedics;  Laterality: Right;  2.5 hrs  . THYROIDECTOMY    . TONSILLECTOMY      There were no vitals filed for this visit.   Subjective Assessment - 05/28/20 0934    Subjective Experiencing pain in R bicep and tricep area.  C/o pain in her L shoulder. Said she was going to follow up with Dr about it.    Currently in Pain? Yes    Pain Score 3                              [facility] Adult PT Treatment/Exercise - 05/28/20 0001      Shoulder Exercises: Seated   Other Seated Exercises Bicep curls x15 #3 wt.bar      Shoulder Exercises: Standing   Extension Both;20 reps;Theraband    Theraband Level (Shoulder Extension) Level 1 (Yellow)    Row Both;20 reps;Theraband   20x3"   Theraband Level (Shoulder Row) Level 1 (Yellow)    Other Standing Exercises Physioball rolls (CW/CCW/FRWD-BACK/S-S) 2x10   L hand over R hand    Other Standing Exercises Cane  Flex/Ext/Scap/IR x10, Tricep ext. 2x10 (yellow TB), shoulder shrugs x15 (yellow tb)      Shoulder Exercises: ROM/Strengthening   UBE (Upper Arm Bike) L1 2.5 mins ea way    Nustep L3 6 mins      Shoulder Exercises: Stretch   Other Shoulder Stretches --      Modalities   Modalities Vasopneumatic      Vasopneumatic   Number Minutes Vasopneumatic  10 minutes    Vasopnuematic Location  Shoulder    Vasopneumatic Pressure Medium    Vasopneumatic Temperature  36                    PT Short Term Goals - 05/04/20 1138      PT SHORT TERM GOAL #1   Title independent with initial HEP    Status Achieved             PT Long Term Goals - 05/15/20 1052      PT LONG TERM GOAL #2   Title decrease pain overall 50%    Status Partially Met      PT LONG TERM GOAL #3   Title increase AROM of right shoulder flexion to 130 degrees    Status On-going                 Plan - 05/28/20 0948    Clinical Impression Statement Arriving at PT today patient was very tender and tight in Bil shoulders. After Pt session she felt alot better. Is doing well with progression of strenthening exercises. Does require a lot of verbal and tactile cue reminders to perform interventions correctly. Encouraged her to follow up with PCP about L shoulder pain that she said was really bothering her today and over the weekend.    PT Treatment/Interventions ADLs/Self Care Home Management;Cryotherapy;Electrical Stimulation;Iontophoresis 61m/ml Dexamethasone;Moist Heat;Ultrasound;Therapeutic exercise;Neuromuscular re-education;Patient/family education;Manual techniques;Vasopneumatic Device;Therapeutic activities    PT Next Visit Plan Progress AAROM/AROM, Strengthening with protocol           Patient will benefit from skilled therapeutic intervention in order to improve the following deficits and impairments:  Decreased range of motion, Increased muscle spasms, Impaired UE functional use, Decreased  endurance, Decreased activity tolerance, Pain, Improper body mechanics, Postural dysfunction, Decreased strength, Decreased scar mobility, Impaired flexibility, Increased edema  Visit Diagnosis: Stiffness of right shoulder, not elsewhere classified  Localized edema     Problem List Patient Active Problem List   Diagnosis Date Noted  . Closed fracture of right proximal humerus 04/03/2020  . Groin hematoma 12/24/2019  . Angina pectoris ([place] 12/23/2019  . Long-term current use of opiate analgesic  07/09/2017  . Chronic pain syndrome 07/09/2017  . Lumbar spondylosis 07/09/2017  . 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 Memory disorder 10/30/2015  . 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 Dyspnea on exertion 09/20/2015  . Unstable angina ([place]) 09/20/2015  . URI, acute 09/20/2015  . Obesity, morbid, BMI 40.0-49.9 ([place]) 06/02/2014  . Hypertension   . Hyperlipidemia   . Diabetes ([place]) 12/02/2013  . Depression 05/27/2013  . Osteoporosis 05/02/2013  . GERD (gastroesophageal reflux disease) 05/25/2012  . Hypothyroidism 05/25/2012  . Restless legs syndrome 05/25/2012  . Vitamin D deficiency 05/25/2012    Lavenia Atlas, SPTA 05/28/2020, 10:14 AM  [place]. [place], [place], [address] Phone: [phone]   Fax:  [phone]  Name: [name] MRN: [id number] Date of Birth: July 01, 1950

## 2020-05-30 ENCOUNTER — Ambulatory Visit: Payer: PPO | Admitting: Physical Therapy

## 2020-05-30 ENCOUNTER — Encounter: Payer: Self-pay | Admitting: Physical Therapy

## 2020-05-30 ENCOUNTER — Other Ambulatory Visit: Payer: Self-pay

## 2020-05-30 DIAGNOSIS — M25511 Pain in right shoulder: Secondary | ICD-10-CM

## 2020-05-30 DIAGNOSIS — R600 Localized edema: Secondary | ICD-10-CM | POA: Diagnosis not present

## 2020-05-30 DIAGNOSIS — M25611 Stiffness of right shoulder, not elsewhere classified: Secondary | ICD-10-CM

## 2020-05-30 NOTE — Therapy (Signed)
[place]. [place], [place], [address] Phone: [phone]   Fax:  [phone] Progress Note Reporting Period 11/2/21to 05/30/20 for the first 10 visits  See note below for Objective Data and Assessment of Progress/Goals.      Physical Therapy Treatment  Patient Details  Name: [name] MRN: [id number] Date of Birth: 05-30-1951 Referring Provider (PT): Victorino December   Encounter Date: 05/30/2020   PT End of Session - 05/30/20 1050    Visit Number 10    Date for PT Re-Evaluation 06/24/20    Authorization Type [employer]    PT Start Time 1009    PT Stop Time 1100    PT Time Calculation (min) 51 min    Activity Tolerance Patient tolerated treatment well    Behavior During Therapy [facility] for tasks assessed/performed           Past Medical History:  Diagnosis Date  . 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 Chronic pain syndrome 07/09/2017  . Coronary artery disease   . Depression   . Diabetes ([place]) 12/02/2013   no current meds   . Dyspnea on exertion 09/20/2015  . Gastro-esophageal reflux   . GERD (gastroesophageal reflux disease) 05/25/2012  . Hyperlipidemia   . Hypertension   . Hypothyroidism 05/25/2012   Last Assessment & Plan:  States was started after a partial thyroidectomy but does not recall any abnormal lab value and no symptoms.  Discussed as is on low dose, would be reasonable to discontinue and recheck in 6-8 weeks which she would like to do. Last Assessment & Plan:  States was started after a partial thyroidectomy but does not recall any abnormal lab value and no symptoms.  Discussed as   . Lumbar spondylosis 07/09/2017  . Memory disorder 10/30/2015  . Obesity, morbid, BMI 40.0-49.9 ([place]) 06/02/2014  . Osteoporosis 05/02/2013   Overview:  DEXA 11/14 DEXA 11/14  . Restless legs syndrome 05/25/2012   Last Assessment & Plan:  patient reports using  clonazepam about once weekly .  Discussed nonpharmacologic tips for treating RLS.  States other medicatiosn she had treid had not been effective (does not remember name) Will refill for now.  Advised if use increases, will readdress in future  . RLS (restless legs syndrome)   . Thyroid disease   . 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 URI, acute 09/20/2015  . Vitamin B 12 deficiency   . Vitamin D deficiency 05/25/2012    Past Surgical History:  Procedure Laterality Date  . ABDOMINAL HYSTERECTOMY    . APPENDECTOMY    . CATARACT EXTRACTION    . CHOLECYSTECTOMY    . CORONARY STENT INTERVENTION N/A 12/23/2019   Procedure: CORONARY STENT INTERVENTION;  Surgeon: Belva Crome, MD;  Location: [place] [facility];  Service: Cardiovascular;  Laterality: N/A;  . JOINT REPLACEMENT     2 total knee replacement  . LASIK    . LEFT HEART CATH AND CORONARY ANGIOGRAPHY N/A 12/23/2019   Procedure: LEFT HEART CATH AND CORONARY ANGIOGRAPHY;  Surgeon: Belva Crome, MD;  Location: [place] [facility];  Service: Cardiovascular;  Laterality: N/A;  . REVERSE SHOULDER ARTHROPLASTY Right 04/03/2020   Procedure: REVERSE SHOULDER ARTHROPLASTY;  Surgeon: Nicholes Stairs, MD;  [facility];  Service: Orthopedics;  Laterality: Right;  2.5 hrs  . THYROIDECTOMY    .  TONSILLECTOMY      There were no vitals filed for this visit.   Subjective Assessment - 05/30/20 1009    Subjective "Good, getting it done"    Currently in Pain? Yes    Pain Score 4     Pain Location Shoulder    Pain Orientation Left;Right                             [facility] Adult PT Treatment/Exercise - 05/30/20 0001      Shoulder Exercises: Standing   External Rotation Theraband;20 reps;Right;Strengthening   limited ROM   Theraband Level (Shoulder External Rotation) Level 1 (Yellow)    Internal Rotation Strengthening;Right;20 reps;Theraband    Theraband Level (Shoulder Internal  Rotation) Level 1 (Yellow)    Extension Both;20 reps;Theraband;Strengthening    Theraband Level (Shoulder Extension) Level 2 (Red)    Row Both;20 reps;Theraband    Theraband Level (Shoulder Row) Level 2 (Red)    Other Standing Exercises Cane 1lb  Flex/Ext//IR x10,       Shoulder Exercises: ROM/Strengthening   UBE (Upper Arm Bike) L1 2.5 mins ea way    Nustep L3 4 mins      Modalities   Modalities Vasopneumatic      Vasopneumatic   Number Minutes Vasopneumatic  10 minutes    Vasopnuematic Location  Shoulder    Vasopneumatic Pressure Low    Vasopneumatic Temperature  36      Manual Therapy   Manual Therapy Passive ROM    Manual therapy comments R shoulder all directions to tolerance, soft tissue on insertion of biceps     Passive ROM all GH motions to pain                    PT Short Term Goals - 05/04/20 1138      PT SHORT TERM GOAL #1   Title independent with initial HEP    Status Achieved             PT Long Term Goals - 05/15/20 1052      PT LONG TERM GOAL #2   Title decrease pain overall 50%    Status Partially Met      PT LONG TERM GOAL #3   Title increase AROM of right shoulder flexion to 130 degrees    Status On-going                 Plan - 05/30/20 1050    Clinical Impression Statement Pt 8 wk's post reverse total shoulder replacement. Progressed to RUE ER/IR under light resistance. Tactile cues provided to squeeze shoulder blades together with standing Tband rows. She did report some difficulty with IR up back with 1lb cane. Constant cues to relax needed during MT due to pt guarding.    Stability/Clinical Decision Making Evolving/Moderate complexity    Rehab Potential Good    PT Frequency 2x / week    PT Duration 8 weeks    PT Treatment/Interventions ADLs/Self Care Home Management;Cryotherapy;Electrical Stimulation;Iontophoresis 80m/ml Dexamethasone;Moist Heat;Ultrasound;Therapeutic exercise;Neuromuscular re-education;Patient/family  education;Manual techniques;Vasopneumatic Device;Therapeutic activities    PT Next Visit Plan Progress AAROM/AROM, Strengthening with protocol           Patient will benefit from skilled therapeutic intervention in order to improve the following deficits and impairments:  Decreased range of motion, Increased muscle spasms, Impaired UE functional use, Decreased endurance, Decreased activity tolerance, Pain, Improper body mechanics, Postural dysfunction, Decreased strength, Decreased scar  mobility, Impaired flexibility, Increased edema  Visit Diagnosis: Localized edema  Stiffness of right shoulder, not elsewhere classified  Acute pain of right shoulder     Problem List Patient Active Problem List   Diagnosis Date Noted  . Closed fracture of right proximal humerus 04/03/2020  . Groin hematoma 12/24/2019  . Angina pectoris ([place]) 12/23/2019  . Long-term current use of opiate analgesic 07/09/2017  . Chronic pain syndrome 07/09/2017  . Lumbar spondylosis 07/09/2017  . 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 Memory disorder 10/30/2015  . 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 Dyspnea on exertion 09/20/2015  . Unstable angina ([place]) 09/20/2015  . URI, acute 09/20/2015  . Obesity, morbid, BMI 40.0-49.9 ([place]) 06/02/2014  . Hypertension   . Hyperlipidemia   . Diabetes ([place]) 12/02/2013  . Depression 05/27/2013  . Osteoporosis 05/02/2013  . GERD (gastroesophageal reflux disease) 05/25/2012  . Hypothyroidism 05/25/2012  . Restless legs syndrome 05/25/2012  . Vitamin D deficiency 05/25/2012    Scot Jun, PTA 05/30/2020, 10:53 AM  [place]. [place], [place], [address] Phone: [phone]   Fax:  [phone]  Name: [name] MRN: [id number] Date of Birth: January 30, 1951

## 2020-06-04 ENCOUNTER — Ambulatory Visit: Payer: PPO | Admitting: Physical Therapy

## 2020-06-04 ENCOUNTER — Other Ambulatory Visit: Payer: Self-pay

## 2020-06-04 ENCOUNTER — Encounter: Payer: Self-pay | Admitting: Physical Therapy

## 2020-06-04 DIAGNOSIS — R600 Localized edema: Secondary | ICD-10-CM | POA: Diagnosis not present

## 2020-06-04 DIAGNOSIS — M25611 Stiffness of right shoulder, not elsewhere classified: Secondary | ICD-10-CM

## 2020-06-04 DIAGNOSIS — M25511 Pain in right shoulder: Secondary | ICD-10-CM

## 2020-06-04 NOTE — Therapy (Signed)
[place]. [place], [place], [address] Phone: [phone]   Fax:  [phone]  Physical Therapy Treatment  Patient Details  Name: [name] MRN: [id number] Date of Birth: 10-Feb-1951 Referring Provider (PT): Victorino December   Encounter Date: 06/04/2020   PT End of Session - 06/04/20 1002    Visit Number 11    Date for PT Re-Evaluation 06/24/20    Authorization Type [employer]    PT Start Time 0930    PT Stop Time 1012    PT Time Calculation (min) 42 min    Activity Tolerance Patient tolerated treatment well    Behavior During Therapy [facility] for tasks assessed/performed           Past Medical History:  Diagnosis Date  . 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 Chronic pain syndrome 07/09/2017  . Coronary artery disease   . Depression   . Diabetes ([place]) 12/02/2013   no current meds   . Dyspnea on exertion 09/20/2015  . Gastro-esophageal reflux   . GERD (gastroesophageal reflux disease) 05/25/2012  . Hyperlipidemia   . Hypertension   . Hypothyroidism 05/25/2012   Last Assessment & Plan:  States was started after a partial thyroidectomy but does not recall any abnormal lab value and no symptoms.  Discussed as is on low dose, would be reasonable to discontinue and recheck in 6-8 weeks which she would like to do. Last Assessment & Plan:  States was started after a partial thyroidectomy but does not recall any abnormal lab value and no symptoms.  Discussed as   . Lumbar spondylosis 07/09/2017  . Memory disorder 10/30/2015  . Obesity, morbid, BMI 40.0-49.9 ([place]) 06/02/2014  . Osteoporosis 05/02/2013   Overview:  DEXA 11/14 DEXA 11/14  . Restless legs syndrome 05/25/2012   Last Assessment & Plan:  patient reports using clonazepam about once weekly .  Discussed nonpharmacologic tips for treating RLS.  States other medicatiosn she had treid had not been effective (does not  remember name) Will refill for now.  Advised if use increases, will readdress in future  . RLS (restless legs syndrome)   . Thyroid disease   . 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 URI, acute 09/20/2015  . Vitamin B 12 deficiency   . Vitamin D deficiency 05/25/2012    Past Surgical History:  Procedure Laterality Date  . ABDOMINAL HYSTERECTOMY    . APPENDECTOMY    . CATARACT EXTRACTION    . CHOLECYSTECTOMY    . CORONARY STENT INTERVENTION N/A 12/23/2019   Procedure: CORONARY STENT INTERVENTION;  Surgeon: Belva Crome, MD;  Location: [place] [facility];  Service: Cardiovascular;  Laterality: N/A;  . JOINT REPLACEMENT     2 total knee replacement  . LASIK    . LEFT HEART CATH AND CORONARY ANGIOGRAPHY N/A 12/23/2019   Procedure: LEFT HEART CATH AND CORONARY ANGIOGRAPHY;  Surgeon: Belva Crome, MD;  Location: [place] [facility];  Service: Cardiovascular;  Laterality: N/A;  . REVERSE SHOULDER ARTHROPLASTY Right 04/03/2020   Procedure: REVERSE SHOULDER ARTHROPLASTY;  Surgeon: Nicholes Stairs, MD;  [facility];  Service: Orthopedics;  Laterality: Right;  2.5 hrs  . THYROIDECTOMY    . TONSILLECTOMY      There were no vitals filed for this visit.   Subjective Assessment - 06/04/20 0930    Subjective feeling  yucky today, both shoulders are hurting,  Tooth ache woke her up this morning    Currently in Pain? Yes    Pain Score 5     Pain Location Shoulder    Pain Orientation Left;Right                             [facility] Adult PT Treatment/Exercise - 06/04/20 0001      Shoulder Exercises: Standing   Extension Both;20 reps;Theraband;Strengthening    Theraband Level (Shoulder Extension) Level 2 (Red)    Row Both;20 reps;Theraband    Theraband Level (Shoulder Row) Level 2 (Red)    Other Standing Exercises Cane 1lb  Flex/Ext//IR x10,       Shoulder Exercises: ROM/Strengthening   UBE (Upper Arm Bike) L1 3 mins ea way       Vasopneumatic   Number Minutes Vasopneumatic  10 minutes    Vasopnuematic Location  Shoulder    Vasopneumatic Pressure Low    Vasopneumatic Temperature  36      Manual Therapy   Manual Therapy Passive ROM    Manual therapy comments R shoulder all directions to tolerance, soft tissue on insertion of biceps     Passive ROM all GH motions to pain                    PT Short Term Goals - 05/04/20 1138      PT SHORT TERM GOAL #1   Title independent with initial HEP    Status Achieved             PT Long Term Goals - 05/15/20 1052      PT LONG TERM GOAL #2   Title decrease pain overall 50%    Status Partially Met      PT LONG TERM GOAL #3   Title increase AROM of right shoulder flexion to 130 degrees    Status On-going                 Plan - 06/04/20 1002    Clinical Impression Statement Pt enters clinic stating she was not feeling well. Good carryover form last session with light resistance exercises. cues to hold AAROM at eng range for a better stretch and ROM.Cues needed with MT to get pt to relax. RUE passive external rotation was the most limited    Stability/Clinical Decision Making Evolving/Moderate complexity    Rehab Potential Good    PT Frequency 2x / week    PT Duration 8 weeks    PT Treatment/Interventions ADLs/Self Care Home Management;Cryotherapy;Electrical Stimulation;Iontophoresis 7m/ml Dexamethasone;Moist Heat;Ultrasound;Therapeutic exercise;Neuromuscular re-education;Patient/family education;Manual techniques;Vasopneumatic Device;Therapeutic activities    PT Next Visit Plan Progress AAROM/AROM, Strengthening with protocol           Patient will benefit from skilled therapeutic intervention in order to improve the following deficits and impairments:  Decreased range of motion,Increased muscle spasms,Impaired UE functional use,Decreased endurance,Decreased activity tolerance,Pain,Improper body mechanics,Postural dysfunction,Decreased  strength,Decreased scar mobility,Impaired flexibility,Increased edema  Visit Diagnosis: Stiffness of right shoulder, not elsewhere classified  Acute pain of right shoulder  Localized edema     Problem List Patient Active Problem List   Diagnosis Date Noted  . Closed fracture of right proximal humerus 04/03/2020  . Groin hematoma 12/24/2019  . Angina pectoris ([place] 12/23/2019  . Long-term current use of opiate analgesic 07/09/2017  . Chronic pain syndrome 07/09/2017  . Lumbar spondylosis 07/09/2017  . 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  Memory disorder 10/30/2015  . 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 Dyspnea on exertion 09/20/2015  . Unstable angina ([place]) 09/20/2015  . URI, acute 09/20/2015  . Obesity, morbid, BMI 40.0-49.9 ([place]) 06/02/2014  . Hypertension   . Hyperlipidemia   . Diabetes ([place]) 12/02/2013  . Depression 05/27/2013  . Osteoporosis 05/02/2013  . GERD (gastroesophageal reflux disease) 05/25/2012  . Hypothyroidism 05/25/2012  . Restless legs syndrome 05/25/2012  . Vitamin D deficiency 05/25/2012    Scot Jun 06/04/2020, 10:08 AM  [place]. [place], [place], [address] Phone: [phone]   Fax:  [phone]  Name: [name] MRN: [id number] Date of Birth: 1950-10-08

## 2020-06-06 ENCOUNTER — Ambulatory Visit: Payer: PPO | Admitting: Physical Therapy

## 2020-06-06 ENCOUNTER — Encounter: Payer: Self-pay | Admitting: Physical Therapy

## 2020-06-06 ENCOUNTER — Other Ambulatory Visit: Payer: Self-pay

## 2020-06-06 DIAGNOSIS — M25511 Pain in right shoulder: Secondary | ICD-10-CM

## 2020-06-06 DIAGNOSIS — M25611 Stiffness of right shoulder, not elsewhere classified: Secondary | ICD-10-CM

## 2020-06-06 DIAGNOSIS — R600 Localized edema: Secondary | ICD-10-CM

## 2020-06-06 NOTE — Therapy (Signed)
[place]. [place], [place], [address] Phone: [phone]   Fax:  [phone]  Physical Therapy Treatment  Patient Details  Name: [name] MRN: [id number] Date of Birth: 19-Apr-1951 Referring Provider (PT): Victorino December   Encounter Date: 06/06/2020   PT End of Session - 06/06/20 1056    Visit Number 12    Date for PT Re-Evaluation 06/24/20    Authorization Type [employer]    PT Start Time 1015    PT Stop Time 1106    PT Time Calculation (min) 51 min    Activity Tolerance Patient tolerated treatment well    Behavior During Therapy [facility] for tasks assessed/performed           Past Medical History:  Diagnosis Date  . 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 Chronic pain syndrome 07/09/2017  . Coronary artery disease   . Depression   . Diabetes ([place]) 12/02/2013   no current meds   . Dyspnea on exertion 09/20/2015  . Gastro-esophageal reflux   . GERD (gastroesophageal reflux disease) 05/25/2012  . Hyperlipidemia   . Hypertension   . Hypothyroidism 05/25/2012   Last Assessment & Plan:  States was started after a partial thyroidectomy but does not recall any abnormal lab value and no symptoms.  Discussed as is on low dose, would be reasonable to discontinue and recheck in 6-8 weeks which she would like to do. Last Assessment & Plan:  States was started after a partial thyroidectomy but does not recall any abnormal lab value and no symptoms.  Discussed as   . Lumbar spondylosis 07/09/2017  . Memory disorder 10/30/2015  . Obesity, morbid, BMI 40.0-49.9 ([place]) 06/02/2014  . Osteoporosis 05/02/2013   Overview:  DEXA 11/14 DEXA 11/14  . Restless legs syndrome 05/25/2012   Last Assessment & Plan:  patient reports using clonazepam about once weekly .  Discussed nonpharmacologic tips for treating RLS.  States other medicatiosn she had treid had not been effective (does not  remember name) Will refill for now.  Advised if use increases, will readdress in future  . RLS (restless legs syndrome)   . Thyroid disease   . 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 URI, acute 09/20/2015  . Vitamin B 12 deficiency   . Vitamin D deficiency 05/25/2012    Past Surgical History:  Procedure Laterality Date  . ABDOMINAL HYSTERECTOMY    . APPENDECTOMY    . CATARACT EXTRACTION    . CHOLECYSTECTOMY    . CORONARY STENT INTERVENTION N/A 12/23/2019   Procedure: CORONARY STENT INTERVENTION;  Surgeon: Belva Crome, MD;  Location: [facility];  Service: Cardiovascular;  Laterality: N/A;  . JOINT REPLACEMENT     2 total knee replacement  . LASIK    . LEFT HEART CATH AND CORONARY ANGIOGRAPHY N/A 12/23/2019   Procedure: LEFT HEART CATH AND CORONARY ANGIOGRAPHY;  Surgeon: Belva Crome, MD;  Location: [facility];  Service: Cardiovascular;  Laterality: N/A;  . REVERSE SHOULDER ARTHROPLASTY Right 04/03/2020   Procedure: REVERSE SHOULDER ARTHROPLASTY;  Surgeon: Nicholes Stairs, MD;  [facility];  Service: Orthopedics;  Laterality: Right;  2.5 hrs  . THYROIDECTOMY    . TONSILLECTOMY      There were no vitals filed for this visit.   Subjective Assessment - 06/06/20 1008    Subjective Aches  and pain in both shoulders, Reports that she thinks she has a rotator cuff issue in LUE    Currently in Pain? Yes    Pain Score 5     Pain Location Shoulder    Pain Orientation Left;Right    Pain Descriptors / Indicators Aching                             [facility] Adult PT Treatment/Exercise - 06/06/20 0001      Shoulder Exercises: Supine   External Rotation Strengthening;Right;10 reps;Weights    External Rotation Weight (lbs) 2    Internal Rotation Strengthening;Right;10 reps;Weights    Internal Rotation Weight (lbs) 2    Flexion AAROM;20 reps      Shoulder Exercises: Standing   Extension Both;20  reps;Theraband;Strengthening    Theraband Level (Shoulder Extension) Level 2 (Red)    Other Standing Exercises 2 cabinet reaches x10 flex & abd RUE      Shoulder Exercises: ROM/Strengthening   UBE (Upper Arm Bike) L1 3 mins ea way    Other ROM/Strengthening Exercises Rows 15lb & Lats 10lb 2x10      Vasopneumatic   Number Minutes Vasopneumatic  10 minutes    Vasopnuematic Location  Shoulder    Vasopneumatic Pressure Medium    Vasopneumatic Temperature  36      Manual Therapy   Manual Therapy Passive ROM    Manual therapy comments R shoulder all directions to tolerance, soft tissue on insertion of biceps     Passive ROM all GH motions to pain                    PT Short Term Goals - 05/04/20 1138      PT SHORT TERM GOAL #1   Title independent with initial HEP    Status Achieved             PT Long Term Goals - 05/15/20 1052      PT LONG TERM GOAL #2   Title decrease pain overall 50%    Status Partially Met      PT LONG TERM GOAL #3   Title increase AROM of right shoulder flexion to 130 degrees    Status On-going                 Plan - 06/06/20 1056    Clinical Impression Statement Pt did well with a progressed treatment session. She was able to reach the second level of the cabinet with both flexion and abduction but with effort. She was able to complete the lat pull downs with no resistance. She did well with machine level seated rows. Some pain at the end range of MT.    Stability/Clinical Decision Making Evolving/Moderate complexity    Rehab Potential Good    PT Frequency 2x / week    PT Duration 8 weeks    PT Treatment/Interventions ADLs/Self Care Home Management;Cryotherapy;Electrical Stimulation;Iontophoresis 4mg /ml Dexamethasone;Moist Heat;Ultrasound;Therapeutic exercise;Neuromuscular re-education;Patient/family education;Manual techniques;Vasopneumatic Device;Therapeutic activities    PT Next Visit Plan Progress AAROM/AROM, Strengthening with  protocol           Patient will benefit from skilled therapeutic intervention in order to improve the following deficits and impairments:  Decreased range of motion,Increased muscle spasms,Impaired UE functional use,Decreased endurance,Decreased activity tolerance,Pain,Improper body mechanics,Postural dysfunction,Decreased strength,Decreased scar mobility,Impaired flexibility,Increased edema  Visit Diagnosis: Localized edema  Acute pain of right shoulder  Stiffness of right shoulder, not elsewhere classified  Problem List Patient Active Problem List   Diagnosis Date Noted  . Closed fracture of right proximal humerus 04/03/2020  . Groin hematoma 12/24/2019  . Angina pectoris ([place]) 12/23/2019  . Long-term current use of opiate analgesic 07/09/2017  . Chronic pain syndrome 07/09/2017  . Lumbar spondylosis 07/09/2017  . 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 Memory disorder 10/30/2015  . 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 Dyspnea on exertion 09/20/2015  . Unstable angina ([place]) 09/20/2015  . URI, acute 09/20/2015  . Obesity, morbid, BMI 40.0-49.9 ([place]) 06/02/2014  . Hypertension   . Hyperlipidemia   . Diabetes ([place]) 12/02/2013  . Depression 05/27/2013  . Osteoporosis 05/02/2013  . GERD (gastroesophageal reflux disease) 05/25/2012  . Hypothyroidism 05/25/2012  . Restless legs syndrome 05/25/2012  . Vitamin D deficiency 05/25/2012    Scot Jun, PTA 06/06/2020, 11:00 AM  [place]. [place], [place], [address] Phone: [phone]   Fax:  [phone]  Name: TYLOR GAMBRILL MRN: [id number] Date of Birth: [birth year]

## 2020-06-11 ENCOUNTER — Other Ambulatory Visit: Payer: Self-pay

## 2020-06-11 ENCOUNTER — Ambulatory Visit: Payer: PPO | Admitting: Physical Therapy

## 2020-06-11 ENCOUNTER — Encounter: Payer: Self-pay | Admitting: Physical Therapy

## 2020-06-11 DIAGNOSIS — R252 Cramp and spasm: Secondary | ICD-10-CM

## 2020-06-11 DIAGNOSIS — R600 Localized edema: Secondary | ICD-10-CM

## 2020-06-11 DIAGNOSIS — M25511 Pain in right shoulder: Secondary | ICD-10-CM

## 2020-06-11 DIAGNOSIS — M25611 Stiffness of right shoulder, not elsewhere classified: Secondary | ICD-10-CM

## 2020-06-11 NOTE — Therapy (Signed)
[place]. [place], [place], [address] Phone: [phone]   Fax:  [phone]  Physical Therapy Treatment  Patient Details  Name: [name] MRN: [id number] Date of Birth: 06-01-1951 Referring Provider (PT): Victorino December   Encounter Date: 06/11/2020   PT End of Session - 06/11/20 1102    Visit Number 13    Date for PT Re-Evaluation 06/24/20    Authorization Type [employer]    PT Start Time 1013    PT Stop Time 1105    PT Time Calculation (min) 52 min    Activity Tolerance Patient tolerated treatment well    Behavior During Therapy [facility] for tasks assessed/performed           Past Medical History:  Diagnosis Date  . 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 Chronic pain syndrome 07/09/2017  . Coronary artery disease   . Depression   . Diabetes ([place]) 12/02/2013   no current meds   . Dyspnea on exertion 09/20/2015  . Gastro-esophageal reflux   . GERD (gastroesophageal reflux disease) 05/25/2012  . Hyperlipidemia   . Hypertension   . Hypothyroidism 05/25/2012   Last Assessment & Plan:  States was started after a partial thyroidectomy but does not recall any abnormal lab value and no symptoms.  Discussed as is on low dose, would be reasonable to discontinue and recheck in 6-8 weeks which she would like to do. Last Assessment & Plan:  States was started after a partial thyroidectomy but does not recall any abnormal lab value and no symptoms.  Discussed as   . Lumbar spondylosis 07/09/2017  . Memory disorder 10/30/2015  . Obesity, morbid, BMI 40.0-49.9 ([place]) 06/02/2014  . Osteoporosis 05/02/2013   Overview:  DEXA 11/14 DEXA 11/14  . Restless legs syndrome 05/25/2012   Last Assessment & Plan:  patient reports using clonazepam about once weekly .  Discussed nonpharmacologic tips for treating RLS.  States other medicatiosn she had treid had not been effective (does not  remember name) Will refill for now.  Advised if use increases, will readdress in future  . RLS (restless legs syndrome)   . Thyroid disease   . 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 URI, acute 09/20/2015  . Vitamin B 12 deficiency   . Vitamin D deficiency 05/25/2012    Past Surgical History:  Procedure Laterality Date  . ABDOMINAL HYSTERECTOMY    . APPENDECTOMY    . CATARACT EXTRACTION    . CHOLECYSTECTOMY    . CORONARY STENT INTERVENTION N/A 12/23/2019   Procedure: CORONARY STENT INTERVENTION;  Surgeon: Belva Crome, MD;  Location: [facility];  Service: Cardiovascular;  Laterality: N/A;  . JOINT REPLACEMENT     2 total knee replacement  . LASIK    . LEFT HEART CATH AND CORONARY ANGIOGRAPHY N/A 12/23/2019   Procedure: LEFT HEART CATH AND CORONARY ANGIOGRAPHY;  Surgeon: Belva Crome, MD;  Location: [facility];  Service: Cardiovascular;  Laterality: N/A;  . REVERSE SHOULDER ARTHROPLASTY Right 04/03/2020   Procedure: REVERSE SHOULDER ARTHROPLASTY;  Surgeon: Nicholes Stairs, MD;  [facility];  Service: Orthopedics;  Laterality: Right;  2.5 hrs  . THYROIDECTOMY    . TONSILLECTOMY      There were no vitals filed for this visit.   Subjective Assessment - 06/11/20 1014    Subjective reports  that her R arm started hurting Saturday "Feels like it is at the bone" aching feeling. Pain is the same now    Currently in Pain? Yes    Pain Score 6     Pain Location Arm    Pain Orientation Right              [facility] PT Assessment - 06/11/20 0001      PROM   Right Shoulder Flexion 140 Degrees    Right Shoulder ABduction 115 Degrees    Right Shoulder Internal Rotation 80 Degrees    Right Shoulder External Rotation 56 Degrees                         [facility] Adult PT Treatment/Exercise - 06/11/20 0001      Shoulder Exercises: Standing   External Rotation Theraband;20 reps;Right;Strengthening    Theraband Level  (Shoulder External Rotation) Level 1 (Yellow)    Internal Rotation Strengthening;Right;20 reps;Theraband    Theraband Level (Shoulder Internal Rotation) Level 1 (Yellow)    Flexion Weights;20 reps;Both;Strengthening    Shoulder Flexion Weight (lbs) 1    ABduction 20 reps;Strengthening;Both;Weights    Shoulder ABduction Weight (lbs) 1      Shoulder Exercises: ROM/Strengthening   UBE (Upper Arm Bike) L1 3 mins ea way      Vasopneumatic   Number Minutes Vasopneumatic  10 minutes    Vasopnuematic Location  Shoulder    Vasopneumatic Pressure Medium    Vasopneumatic Temperature  36      Manual Therapy   Manual Therapy Passive ROM    Manual therapy comments R shoulder all directions to tolerance, soft tissue on insertion of biceps     Passive ROM all GH motions to pain                    PT Short Term Goals - 05/04/20 1138      PT SHORT TERM GOAL #1   Title independent with initial HEP    Status Achieved             PT Long Term Goals - 05/15/20 1052      PT LONG TERM GOAL #2   Title decrease pain overall 50%    Status Partially Met      PT LONG TERM GOAL #3   Title increase AROM of right shoulder flexion to 130 degrees    Status On-going                 Plan - 06/11/20 1103    Clinical Impression Statement Pt reports some R arm pain that started yesterday. Some R shoulder elevation and pain with standing shoulder abduction and flexion. Tactile cues to R elbow with external rotation to keep elbow to her side. Cue needed to relax today's as pt was resisting PROM.    Stability/Clinical Decision Making Evolving/Moderate complexity    Rehab Potential Good    PT Frequency 2x / week    PT Duration 8 weeks    PT Treatment/Interventions ADLs/Self Care Home Management;Cryotherapy;Electrical Stimulation;Iontophoresis 88m/ml Dexamethasone;Moist Heat;Ultrasound;Therapeutic exercise;Neuromuscular re-education;Patient/family education;Manual techniques;Vasopneumatic  Device;Therapeutic activities    PT Next Visit Plan Progress AAROM/AROM, Strengthening with protocol           Patient will benefit from skilled therapeutic intervention in order to improve the following deficits and impairments:  Decreased range of motion,Increased muscle spasms,Impaired UE functional use,Decreased endurance,Decreased activity tolerance,Pain,Improper body mechanics,Postural dysfunction,Decreased strength,Decreased scar mobility,Impaired flexibility,Increased edema  Visit Diagnosis: Localized edema  Acute pain of right shoulder  Cramp and spasm  Stiffness of right shoulder, not elsewhere classified     Problem List Patient Active Problem List   Diagnosis Date Noted  . Closed fracture of right proximal humerus 04/03/2020  . Groin hematoma 12/24/2019  . Angina pectoris ([place]) 12/23/2019  . Long-term current use of opiate analgesic 07/09/2017  . Chronic pain syndrome 07/09/2017  . Lumbar spondylosis 07/09/2017  . 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 Memory disorder 10/30/2015  . 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 Dyspnea on exertion 09/20/2015  . Unstable angina ([place]) 09/20/2015  . URI, acute 09/20/2015  . Obesity, morbid, BMI 40.0-49.9 ([place]) 06/02/2014  . Hypertension   . Hyperlipidemia   . Diabetes ([place]) 12/02/2013  . Depression 05/27/2013  . Osteoporosis 05/02/2013  . GERD (gastroesophageal reflux disease) 05/25/2012  . Hypothyroidism 05/25/2012  . Restless legs syndrome 05/25/2012  . Vitamin D deficiency 05/25/2012    Scot Jun 06/11/2020, 11:06 AM  [place]. [place], [place], [address] Phone: [phone]   Fax:  [phone]  Name: ZULY BELKIN MRN: [id number] Date of Birth: [birth year]

## 2020-06-13 ENCOUNTER — Encounter: Payer: Self-pay | Admitting: Physical Therapy

## 2020-06-13 ENCOUNTER — Ambulatory Visit: Payer: PPO | Admitting: Physical Therapy

## 2020-06-13 ENCOUNTER — Other Ambulatory Visit: Payer: Self-pay

## 2020-06-13 DIAGNOSIS — M25611 Stiffness of right shoulder, not elsewhere classified: Secondary | ICD-10-CM

## 2020-06-13 DIAGNOSIS — R600 Localized edema: Secondary | ICD-10-CM | POA: Diagnosis not present

## 2020-06-13 DIAGNOSIS — M25511 Pain in right shoulder: Secondary | ICD-10-CM

## 2020-06-13 DIAGNOSIS — R252 Cramp and spasm: Secondary | ICD-10-CM

## 2020-06-13 NOTE — Therapy (Signed)
[place]. [place], [place], [address] Phone: [phone]   Fax:  [phone]  Physical Therapy Treatment  Patient Details  Name: [name] MRN: [id number] Date of Birth: Jun 30, 1950 Referring Provider (PT): Victorino December   Encounter Date: 06/13/2020   PT End of Session - 06/13/20 1053    Visit Number 14    Date for PT Re-Evaluation 06/24/20    Authorization Type [employer]    PT Start Time 1013    PT Stop Time 1105    PT Time Calculation (min) 52 min    Activity Tolerance Patient tolerated treatment well    Behavior During Therapy [facility] for tasks assessed/performed           Past Medical History:  Diagnosis Date  . 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 Chronic pain syndrome 07/09/2017  . Coronary artery disease   . Depression   . Diabetes ([place]) 12/02/2013   no current meds   . Dyspnea on exertion 09/20/2015  . Gastro-esophageal reflux   . GERD (gastroesophageal reflux disease) 05/25/2012  . Hyperlipidemia   . Hypertension   . Hypothyroidism 05/25/2012   Last Assessment & Plan:  States was started after a partial thyroidectomy but does not recall any abnormal lab value and no symptoms.  Discussed as is on low dose, would be reasonable to discontinue and recheck in 6-8 weeks which she would like to do. Last Assessment & Plan:  States was started after a partial thyroidectomy but does not recall any abnormal lab value and no symptoms.  Discussed as   . Lumbar spondylosis 07/09/2017  . Memory disorder 10/30/2015  . Obesity, morbid, BMI 40.0-49.9 ([place]) 06/02/2014  . Osteoporosis 05/02/2013   Overview:  DEXA 11/14 DEXA 11/14  . Restless legs syndrome 05/25/2012   Last Assessment & Plan:  patient reports using clonazepam about once weekly .  Discussed nonpharmacologic tips for treating RLS.  States other medicatiosn she had treid had not been effective (does not  remember name) Will refill for now.  Advised if use increases, will readdress in future  . RLS (restless legs syndrome)   . Thyroid disease   . 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 URI, acute 09/20/2015  . Vitamin B 12 deficiency   . Vitamin D deficiency 05/25/2012    Past Surgical History:  Procedure Laterality Date  . ABDOMINAL HYSTERECTOMY    . APPENDECTOMY    . CATARACT EXTRACTION    . CHOLECYSTECTOMY    . CORONARY STENT INTERVENTION N/A 12/23/2019   Procedure: CORONARY STENT INTERVENTION;  Surgeon: Belva Crome, MD;  Location: [facility];  Service: Cardiovascular;  Laterality: N/A;  . JOINT REPLACEMENT     2 total knee replacement  . LASIK    . LEFT HEART CATH AND CORONARY ANGIOGRAPHY N/A 12/23/2019   Procedure: LEFT HEART CATH AND CORONARY ANGIOGRAPHY;  Surgeon: Belva Crome, MD;  Location: [facility];  Service: Cardiovascular;  Laterality: N/A;  . REVERSE SHOULDER ARTHROPLASTY Right 04/03/2020   Procedure: REVERSE SHOULDER ARTHROPLASTY;  Surgeon: Nicholes Stairs, MD;  [facility];  Service: Orthopedics;  Laterality: Right;  2.5 hrs  . THYROIDECTOMY    . TONSILLECTOMY      There were no vitals filed for this visit.   Subjective Assessment - 06/13/20 1016    Subjective Got  booster shot yesterday, feels like the L arm is going to fall off. R arm is ok, just stays sore down in the bone    Currently in Pain? Yes    Pain Score 4     Pain Location Shoulder    Pain Orientation Right                             [facility] Adult PT Treatment/Exercise - 06/13/20 0001      Shoulder Exercises: Standing   External Rotation Theraband;20 reps;Right;Strengthening    Theraband Level (Shoulder External Rotation) Level 1 (Yellow)    Internal Rotation Strengthening;Right;20 reps;Theraband    Theraband Level (Shoulder Internal Rotation) Level 1 (Yellow)    Flexion Weights;20 reps;Both;Strengthening     ABduction 20 reps;Strengthening;Both;Weights    Other Standing Exercises tricep ext 2x10 15lb    Other Standing Exercises Flex 1lb  & abd up wall  pillow case x10      Shoulder Exercises: ROM/Strengthening   UBE (Upper Arm Bike) L1 3 mins ea way      Vasopneumatic   Number Minutes Vasopneumatic  10 minutes    Vasopnuematic Location  Shoulder    Vasopneumatic Pressure Medium    Vasopneumatic Temperature  36      Manual Therapy   Manual Therapy Passive ROM    Manual therapy comments R shoulder all directions to tolerance, soft tissue on insertion of biceps     Passive ROM all GH motions to pain                    PT Short Term Goals - 05/04/20 1138      PT SHORT TERM GOAL #1   Title independent with initial HEP    Status Achieved             PT Long Term Goals - 05/15/20 1052      PT LONG TERM GOAL #2   Title decrease pain overall 50%    Status Partially Met      PT LONG TERM GOAL #3   Title increase AROM of right shoulder flexion to 130 degrees    Status On-going                 Plan - 06/13/20 1056    Clinical Impression Statement Pt did well overall today despite fatigue and pain from booster shot. She did well progressing her ROM with up the wall stretches. Some R shoulder elevations and weakness present with standing abduction and flexion. Tactile ces to R elbow with externals rotations to keep elbow to her side. She did well with PROM after constant cues to relax.    Stability/Clinical Decision Making Evolving/Moderate complexity    Rehab Potential Good    PT Frequency 2x / week    PT Duration 8 weeks    PT Treatment/Interventions ADLs/Self Care Home Management;Cryotherapy;Electrical Stimulation;Iontophoresis 5m/ml Dexamethasone;Moist Heat;Ultrasound;Therapeutic exercise;Neuromuscular re-education;Patient/family education;Manual techniques;Vasopneumatic Device;Therapeutic activities    PT Next Visit Plan Progress AAROM/AROM, Strengthening with  protocol           Patient will benefit from skilled therapeutic intervention in order to improve the following deficits and impairments:  Decreased range of motion,Increased muscle spasms,Impaired UE functional use,Decreased endurance,Decreased activity tolerance,Pain,Improper body mechanics,Postural dysfunction,Decreased strength,Decreased scar mobility,Impaired flexibility,Increased edema  Visit Diagnosis: Cramp and spasm  Localized edema  Acute pain of right shoulder  Stiffness of right shoulder, not elsewhere classified  Problem List Patient Active Problem List   Diagnosis Date Noted  . Closed fracture of right proximal humerus 04/03/2020  . Groin hematoma 12/24/2019  . Angina pectoris ([place]) 12/23/2019  . Long-term current use of opiate analgesic 07/09/2017  . Chronic pain syndrome 07/09/2017  . Lumbar spondylosis 07/09/2017  . 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 Memory disorder 10/30/2015  . 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 Dyspnea on exertion 09/20/2015  . Unstable angina ([place]) 09/20/2015  . URI, acute 09/20/2015  . Obesity, morbid, BMI 40.0-49.9 ([place]) 06/02/2014  . Hypertension   . Hyperlipidemia   . Diabetes ([place]) 12/02/2013  . Depression 05/27/2013  . Osteoporosis 05/02/2013  . GERD (gastroesophageal reflux disease) 05/25/2012  . Hypothyroidism 05/25/2012  . Restless legs syndrome 05/25/2012  . Vitamin D deficiency 05/25/2012    Scot Jun, PTA 06/13/2020, 11:00 AM  [place]. [place], [place], [address] Phone: [phone]   Fax:  [phone]  Name: EVENY ANASTAS MRN: [id number] Date of Birth: [birth year]

## 2020-06-18 ENCOUNTER — Encounter: Payer: Self-pay | Admitting: Physical Therapy

## 2020-06-18 ENCOUNTER — Ambulatory Visit: Payer: PPO | Admitting: Physical Therapy

## 2020-06-18 ENCOUNTER — Other Ambulatory Visit: Payer: Self-pay

## 2020-06-18 DIAGNOSIS — R252 Cramp and spasm: Secondary | ICD-10-CM

## 2020-06-18 DIAGNOSIS — R600 Localized edema: Secondary | ICD-10-CM | POA: Diagnosis not present

## 2020-06-18 DIAGNOSIS — M25611 Stiffness of right shoulder, not elsewhere classified: Secondary | ICD-10-CM

## 2020-06-18 DIAGNOSIS — M25511 Pain in right shoulder: Secondary | ICD-10-CM

## 2020-06-18 NOTE — Patient Instructions (Signed)
Access Code: [email or web address] URL: [email or web address] Date: 06/18/2020 Prepared by: Debroah Baller  Exercises Shoulder External Rotation with Anchored Resistance - 1 x daily - 7 x weekly - 3 sets - 10 reps Shoulder Internal Rotation with Resistance - 1 x daily - 7 x weekly - 3 sets - 10 reps Standing Bilateral Low Shoulder Row with Anchored Resistance - 1 x daily - 7 x weekly - 3 sets - 10 reps Shoulder Extension with Resistance - 1 x daily - 7 x weekly - 3 sets - 10 reps

## 2020-06-18 NOTE — Therapy (Signed)
[place]. [place], [place], [address] Phone: [phone]   Fax:  [phone]  Physical Therapy Treatment  Patient Details  Name: [name] MRN: [id number] Date of Birth: 1950-09-19 Referring Provider (PT): Victorino December   Encounter Date: 06/18/2020   PT End of Session - 06/18/20 1055    Visit Number 15    Date for PT Re-Evaluation 06/24/20    Authorization Type [employer]    PT Start Time 1015    PT Stop Time 1105    PT Time Calculation (min) 50 min    Activity Tolerance Patient tolerated treatment well    Behavior During Therapy [facility] for tasks assessed/performed           Past Medical History:  Diagnosis Date  . 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 Chronic pain syndrome 07/09/2017  . Coronary artery disease   . Depression   . Diabetes ([place]) 12/02/2013   no current meds   . Dyspnea on exertion 09/20/2015  . Gastro-esophageal reflux   . GERD (gastroesophageal reflux disease) 05/25/2012  . Hyperlipidemia   . Hypertension   . Hypothyroidism 05/25/2012   Last Assessment & Plan:  States was started after a partial thyroidectomy but does not recall any abnormal lab value and no symptoms.  Discussed as is on low dose, would be reasonable to discontinue and recheck in 6-8 weeks which she would like to do. Last Assessment & Plan:  States was started after a partial thyroidectomy but does not recall any abnormal lab value and no symptoms.  Discussed as   . Lumbar spondylosis 07/09/2017  . Memory disorder 10/30/2015  . Obesity, morbid, BMI 40.0-49.9 ([place]) 06/02/2014  . Osteoporosis 05/02/2013   Overview:  DEXA 11/14 DEXA 11/14  . Restless legs syndrome 05/25/2012   Last Assessment & Plan:  patient reports using clonazepam about once weekly .  Discussed nonpharmacologic tips for treating RLS.  States other medicatiosn she had treid had not been effective (does not  remember name) Will refill for now.  Advised if use increases, will readdress in future  . RLS (restless legs syndrome)   . Thyroid disease   . 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 URI, acute 09/20/2015  . Vitamin B 12 deficiency   . Vitamin D deficiency 05/25/2012    Past Surgical History:  Procedure Laterality Date  . ABDOMINAL HYSTERECTOMY    . APPENDECTOMY    . CATARACT EXTRACTION    . CHOLECYSTECTOMY    . CORONARY STENT INTERVENTION N/A 12/23/2019   Procedure: CORONARY STENT INTERVENTION;  Surgeon: Belva Crome, MD;  Location: [place] [facility];  Service: Cardiovascular;  Laterality: N/A;  . JOINT REPLACEMENT     2 total knee replacement  . LASIK    . LEFT HEART CATH AND CORONARY ANGIOGRAPHY N/A 12/23/2019   Procedure: LEFT HEART CATH AND CORONARY ANGIOGRAPHY;  Surgeon: Belva Crome, MD;  Location: [place] [facility];  Service: Cardiovascular;  Laterality: N/A;  . REVERSE SHOULDER ARTHROPLASTY Right 04/03/2020   Procedure: REVERSE SHOULDER ARTHROPLASTY;  Surgeon: Nicholes Stairs, MD;  [facility];  Service: Orthopedics;  Laterality: Right;  2.5 hrs  . THYROIDECTOMY    . TONSILLECTOMY      There were no vitals filed for this visit.   Subjective Assessment - 06/18/20 1009    Subjective Reports  that's her L shoulder has been swollen and feverish from booster shot. R shoulder is all right    Currently in Pain? Yes    Pain Score 4     Pain Location Shoulder    Pain Orientation Left                             [facility] Adult PT Treatment/Exercise - 06/18/20 0001      Shoulder Exercises: Standing   ABduction 20 reps;Strengthening;Weights;Right    Shoulder ABduction Weight (lbs) 1    Other Standing Exercises 2 cabinet reaches x10 flex & abd RUE    Other Standing Exercises Flex 2lb bar 2x10      Shoulder Exercises: ROM/Strengthening   UBE (Upper Arm Bike) L1.6 x2 each    Nustep L3 4 mins    Other  ROM/Strengthening Exercises Rows 15lb & Lats 10lb 2x10      Vasopneumatic   Number Minutes Vasopneumatic  10 minutes    Vasopnuematic Location  Shoulder    Vasopneumatic Pressure Medium    Vasopneumatic Temperature  36      Manual Therapy   Manual Therapy Passive ROM    Manual therapy comments R shoulder all directions to tolerance, soft tissue on insertion of biceps     Passive ROM all GH motions to pain                    PT Short Term Goals - 06/18/20 1058      PT SHORT TERM GOAL #1   Title independent with initial HEP    Status Achieved             PT Long Term Goals - 06/18/20 1058      PT LONG TERM GOAL #1   Title understand posture and body mechanics and the protocol for her surgery    Status Achieved      PT LONG TERM GOAL #2   Title decrease pain overall 50%    Status Partially Met      PT LONG TERM GOAL #3   Title increase AROM of right shoulder flexion to 130 degrees    Status Partially Met      PT LONG TERM GOAL #4   Title report able to do seatbelt wihtout pain >3/10    Status On-going      PT LONG TERM GOAL #5   Title report no difficulty dressing and doing hair    Status On-going                 Plan - 06/18/20 1056    Clinical Impression Statement Pt gave good effort through session. She did well with all the interventions. Lat pull down remains very weak, unable to tolerated any bar resistance. No reports of increase pain. Some compensation with standing shoulder abduction. Good PROM with some D2 flexion and ER tightness.    Rehab Potential Good    PT Frequency 2x / week    PT Duration 8 weeks    PT Treatment/Interventions ADLs/Self Care Home Management;Cryotherapy;Electrical Stimulation;Iontophoresis 59m/ml Dexamethasone;Moist Heat;Ultrasound;Therapeutic exercise;Neuromuscular re-education;Patient/family education;Manual techniques;Vasopneumatic Device;Therapeutic activities    PT Next Visit Plan Progress AAROM/AROM,  Strengthening with protocol           Patient will benefit from skilled therapeutic intervention in order to improve the following deficits and impairments:  Decreased range of motion,Increased muscle spasms,Impaired UE functional use,Decreased endurance,Decreased activity tolerance,Pain,Improper body mechanics,Postural dysfunction,Decreased  strength,Decreased scar mobility,Impaired flexibility,Increased edema  Visit Diagnosis: Localized edema  Cramp and spasm  Stiffness of right shoulder, not elsewhere classified  Acute pain of right shoulder     Problem List Patient Active Problem List   Diagnosis Date Noted  . Closed fracture of right proximal humerus 04/03/2020  . Groin hematoma 12/24/2019  . Angina pectoris ([place]) 12/23/2019  . Long-term current use of opiate analgesic 07/09/2017  . Chronic pain syndrome 07/09/2017  . Lumbar spondylosis 07/09/2017  . 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 Memory disorder 10/30/2015  . 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 Dyspnea on exertion 09/20/2015  . Unstable angina ([place]) 09/20/2015  . URI, acute 09/20/2015  . Obesity, morbid, BMI 40.0-49.9 ([place]) 06/02/2014  . Hypertension   . Hyperlipidemia   . Diabetes ([place]) 12/02/2013  . Depression 05/27/2013  . Osteoporosis 05/02/2013  . GERD (gastroesophageal reflux disease) 05/25/2012  . Hypothyroidism 05/25/2012  . Restless legs syndrome 05/25/2012  . Vitamin D deficiency 05/25/2012    Scot Jun, PTA 06/18/2020, 10:59 AM  [place]. [place], [place], [address] Phone: [phone]   Fax:  [phone]  Name: SHERRAN MARGOLIS MRN: [id number] Date of Birth: Oct 11, 1950

## 2020-06-20 ENCOUNTER — Ambulatory Visit: Payer: PPO | Admitting: Physical Therapy

## 2020-06-20 ENCOUNTER — Encounter: Payer: Self-pay | Admitting: Physical Therapy

## 2020-06-20 ENCOUNTER — Other Ambulatory Visit: Payer: Self-pay

## 2020-06-20 DIAGNOSIS — M25611 Stiffness of right shoulder, not elsewhere classified: Secondary | ICD-10-CM

## 2020-06-20 DIAGNOSIS — R252 Cramp and spasm: Secondary | ICD-10-CM

## 2020-06-20 DIAGNOSIS — R600 Localized edema: Secondary | ICD-10-CM | POA: Diagnosis not present

## 2020-06-20 NOTE — Therapy (Signed)
[place]. [place], [place], [address] Phone: [phone]   Fax:  [phone]  Physical Therapy Treatment  Patient Details  Name: [name] MRN: [id number] Date of Birth: [birth year] Referring Provider (PT): Victorino December   Encounter Date: 06/20/2020   PT End of Session - 06/20/20 1056    Visit Number 16    Date for PT Re-Evaluation 06/24/20    PT Start Time 1010    PT Stop Time 1105    PT Time Calculation (min) 55 min    Activity Tolerance Patient tolerated treatment well    Behavior During Therapy [facility] for tasks assessed/performed           Past Medical History:  Diagnosis Date  . 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 Chronic pain syndrome 07/09/2017  . Coronary artery disease   . Depression   . Diabetes ([place]) 12/02/2013   no current meds   . Dyspnea on exertion 09/20/2015  . Gastro-esophageal reflux   . GERD (gastroesophageal reflux disease) 05/25/2012  . Hyperlipidemia   . Hypertension   . Hypothyroidism 05/25/2012   Last Assessment & Plan:  States was started after a partial thyroidectomy but does not recall any abnormal lab value and no symptoms.  Discussed as is on low dose, would be reasonable to discontinue and recheck in 6-8 weeks which she would like to do. Last Assessment & Plan:  States was started after a partial thyroidectomy but does not recall any abnormal lab value and no symptoms.  Discussed as   . Lumbar spondylosis 07/09/2017  . Memory disorder 10/30/2015  . Obesity, morbid, BMI 40.0-49.9 ([place]) 06/02/2014  . Osteoporosis 05/02/2013   Overview:  DEXA 11/14 DEXA 11/14  . Restless legs syndrome 05/25/2012   Last Assessment & Plan:  patient reports using clonazepam about once weekly .  Discussed nonpharmacologic tips for treating RLS.  States other medicatiosn she had treid had not been effective (does not remember name) Will refill for now.  Advised if  use increases, will readdress in future  . RLS (restless legs syndrome)   . Thyroid disease   . 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 URI, acute 09/20/2015  . Vitamin B 12 deficiency   . Vitamin D deficiency 05/25/2012    Past Surgical History:  Procedure Laterality Date  . ABDOMINAL HYSTERECTOMY    . APPENDECTOMY    . CATARACT EXTRACTION    . CHOLECYSTECTOMY    . CORONARY STENT INTERVENTION N/A 12/23/2019   Procedure: CORONARY STENT INTERVENTION;  Surgeon: Belva Crome, MD;  Location: [place] [facility];  Service: Cardiovascular;  Laterality: N/A;  . JOINT REPLACEMENT     2 total knee replacement  . LASIK    . LEFT HEART CATH AND CORONARY ANGIOGRAPHY N/A 12/23/2019   Procedure: LEFT HEART CATH AND CORONARY ANGIOGRAPHY;  Surgeon: Belva Crome, MD;  Location: [place] [facility];  Service: Cardiovascular;  Laterality: N/A;  . REVERSE SHOULDER ARTHROPLASTY Right 04/03/2020   Procedure: REVERSE SHOULDER ARTHROPLASTY;  Surgeon: Nicholes Stairs, MD;  [facility];  Service: Orthopedics;  Laterality: Right;  2.5 hrs  . THYROIDECTOMY    . TONSILLECTOMY      There were no vitals filed for this visit.   Subjective Assessment - 06/20/20 1021    Subjective "as expected"    Currently in Pain?  Yes    Pain Score 4     Pain Location Shoulder    Pain Orientation Left                             [facility] Adult PT Treatment/Exercise - 06/20/20 0001      Shoulder Exercises: Supine   External Rotation Strengthening;Right;Weights;20 reps    External Rotation Weight (lbs) 2    Internal Rotation Strengthening;Right;Weights;20 reps    Internal Rotation Weight (lbs) 2      Shoulder Exercises: Standing   Extension Strengthening;Both;20 reps;Weights    Extension Weight (lbs) 5    Other Standing Exercises Flex 2lb bar 2x10      Shoulder Exercises: ROM/Strengthening   UBE (Upper Arm Bike) L1 x3 each    Other ROM/Strengthening  Exercises Rows 15lb & Lats 10lb 2x10      Vasopneumatic   Number Minutes Vasopneumatic  10 minutes    Vasopnuematic Location  Shoulder    Vasopneumatic Pressure Medium    Vasopneumatic Temperature  36      Manual Therapy   Manual Therapy Passive ROM    Manual therapy comments R shoulder all directions to tolerance, soft tissue on insertion of biceps     Passive ROM all GH motions to pain                    PT Short Term Goals - 06/18/20 1058      PT SHORT TERM GOAL #1   Title independent with initial HEP    Status Achieved             PT Long Term Goals - 06/18/20 1058      PT LONG TERM GOAL #1   Title understand posture and body mechanics and the protocol for her surgery    Status Achieved      PT LONG TERM GOAL #2   Title decrease pain overall 50%    Status Partially Met      PT LONG TERM GOAL #3   Title increase AROM of right shoulder flexion to 130 degrees    Status Partially Met      PT LONG TERM GOAL #4   Title report able to do seatbelt wihtout pain >3/10    Status On-going      PT LONG TERM GOAL #5   Title report no difficulty dressing and doing hair    Status On-going                 Plan - 06/20/20 1056    Clinical Impression Statement Pt did well overall in today's session. She still has some R shoulder weakness and decrease ROM. R shoulder elevation noted with standing shoulder abduction. Tactile cues with supine ER/IR for proper arm positioning. no reports of pain. Some end range tightness with PROM.    Stability/Clinical Decision Making Evolving/Moderate complexity    Rehab Potential Good    PT Frequency 2x / week    PT Duration 8 weeks    PT Treatment/Interventions ADLs/Self Care Home Management;Cryotherapy;Electrical Stimulation;Iontophoresis 45m/ml Dexamethasone;Moist Heat;Ultrasound;Therapeutic exercise;Neuromuscular re-education;Patient/family education;Manual techniques;Vasopneumatic Device;Therapeutic activities    PT Next  Visit Plan Progress AAROM/AROM, Strengthening with protocol           Patient will benefit from skilled therapeutic intervention in order to improve the following deficits and impairments:  Decreased range of motion,Increased muscle spasms,Impaired UE functional use,Decreased endurance,Decreased activity tolerance,Pain,Improper body mechanics,Postural dysfunction,Decreased strength,Decreased  scar mobility,Impaired flexibility,Increased edema  Visit Diagnosis: Stiffness of right shoulder, not elsewhere classified  Localized edema  Cramp and spasm     Problem List Patient Active Problem List   Diagnosis Date Noted  . Closed fracture of right proximal humerus 04/03/2020  . Groin hematoma 12/24/2019  . Angina pectoris ([place]) 12/23/2019  . Long-term current use of opiate analgesic 07/09/2017  . Chronic pain syndrome 07/09/2017  . Lumbar spondylosis 07/09/2017  . 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 Memory disorder 10/30/2015  . 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 Dyspnea on exertion 09/20/2015  . Unstable angina ([place]) 09/20/2015  . URI, acute 09/20/2015  . Obesity, morbid, BMI 40.0-49.9 ([place]) 06/02/2014  . Hypertension   . Hyperlipidemia   . Diabetes ([place]) 12/02/2013  . Depression 05/27/2013  . Osteoporosis 05/02/2013  . GERD (gastroesophageal reflux disease) 05/25/2012  . Hypothyroidism 05/25/2012  . Restless legs syndrome 05/25/2012  . Vitamin D deficiency 05/25/2012    Scot Jun, PTA 06/20/2020, 10:59 AM  [place]. [place], [place], [address] Phone: [phone]   Fax:  [phone]  Name: [name] MRN: [id number] Date of Birth: [birth year]

## 2020-06-27 ENCOUNTER — Encounter: Payer: Self-pay | Admitting: Physical Therapy

## 2020-06-27 ENCOUNTER — Ambulatory Visit: Payer: PPO | Attending: Orthopedic Surgery | Admitting: Physical Therapy

## 2020-06-27 ENCOUNTER — Other Ambulatory Visit: Payer: Self-pay

## 2020-06-27 DIAGNOSIS — M25611 Stiffness of right shoulder, not elsewhere classified: Secondary | ICD-10-CM | POA: Insufficient documentation

## 2020-06-27 DIAGNOSIS — M25511 Pain in right shoulder: Secondary | ICD-10-CM | POA: Insufficient documentation

## 2020-06-27 DIAGNOSIS — R600 Localized edema: Secondary | ICD-10-CM | POA: Diagnosis not present

## 2020-06-27 DIAGNOSIS — M25512 Pain in left shoulder: Secondary | ICD-10-CM | POA: Diagnosis not present

## 2020-06-27 DIAGNOSIS — M25612 Stiffness of left shoulder, not elsewhere classified: Secondary | ICD-10-CM

## 2020-06-27 DIAGNOSIS — R252 Cramp and spasm: Secondary | ICD-10-CM

## 2020-06-27 NOTE — Therapy (Signed)
[place]. [place], [place], [address] Phone: [phone]   Fax:  [phone]  Physical Therapy Treatment  Patient Details  Name: [name] MRN: [id number] Date of Birth: December 21, 1950 Referring Provider (PT): Victorino December   Encounter Date: 06/27/2020   PT End of Session - 06/27/20 1054    Visit Number 17    Date for PT Re-Evaluation 06/24/20    Authorization Type [employer]    PT Start Time 1013    PT Stop Time 1104    PT Time Calculation (min) 51 min    Activity Tolerance Patient tolerated treatment well    Behavior During Therapy [facility] for tasks assessed/performed           Past Medical History:  Diagnosis Date  . 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 Chronic pain syndrome 07/09/2017  . Coronary artery disease   . Depression   . Diabetes ([place]) 12/02/2013   no current meds   . Dyspnea on exertion 09/20/2015  . Gastro-esophageal reflux   . GERD (gastroesophageal reflux disease) 05/25/2012  . Hyperlipidemia   . Hypertension   . Hypothyroidism 05/25/2012   Last Assessment & Plan:  States was started after a partial thyroidectomy but does not recall any abnormal lab value and no symptoms.  Discussed as is on low dose, would be reasonable to discontinue and recheck in 6-8 weeks which she would like to do. Last Assessment & Plan:  States was started after a partial thyroidectomy but does not recall any abnormal lab value and no symptoms.  Discussed as   . Lumbar spondylosis 07/09/2017  . Memory disorder 10/30/2015  . Obesity, morbid, BMI 40.0-49.9 ([place]) 06/02/2014  . Osteoporosis 05/02/2013   Overview:  DEXA 11/14 DEXA 11/14  . Restless legs syndrome 05/25/2012   Last Assessment & Plan:  patient reports using clonazepam about once weekly .  Discussed nonpharmacologic tips for treating RLS.  States other medicatiosn she had treid had not been effective (does not  remember name) Will refill for now.  Advised if use increases, will readdress in future  . RLS (restless legs syndrome)   . Thyroid disease   . 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 URI, acute 09/20/2015  . Vitamin B 12 deficiency   . Vitamin D deficiency 05/25/2012    Past Surgical History:  Procedure Laterality Date  . ABDOMINAL HYSTERECTOMY    . APPENDECTOMY    . CATARACT EXTRACTION    . CHOLECYSTECTOMY    . CORONARY STENT INTERVENTION N/A 12/23/2019   Procedure: CORONARY STENT INTERVENTION;  Surgeon: Belva Crome, MD;  Location: [place] [facility];  Service: Cardiovascular;  Laterality: N/A;  . JOINT REPLACEMENT     2 total knee replacement  . LASIK    . LEFT HEART CATH AND CORONARY ANGIOGRAPHY N/A 12/23/2019   Procedure: LEFT HEART CATH AND CORONARY ANGIOGRAPHY;  Surgeon: Belva Crome, MD;  Location: [place] [facility];  Service: Cardiovascular;  Laterality: N/A;  . REVERSE SHOULDER ARTHROPLASTY Right 04/03/2020   Procedure: REVERSE SHOULDER ARTHROPLASTY;  Surgeon: Nicholes Stairs, MD;  [facility];  Service: Orthopedics;  Laterality: Right;  2.5 hrs  . THYROIDECTOMY    . TONSILLECTOMY      There were no vitals filed for this visit.   Subjective Assessment - 06/27/20 1015    Subjective "Ive  go real terrible pain in both shoulder, but I got a MD appointment tomorrow at 8 o'clock" Pt stated pain could be due to cold weather    Currently in Pain? Yes    Pain Score 6     Pain Location Shoulder    Pain Orientation Left;Right                             [facility] Adult PT Treatment/Exercise - 06/27/20 0001      Shoulder Exercises: Standing   ABduction 20 reps;Strengthening;Weights;Right    Shoulder ABduction Weight (lbs) 1    Other Standing Exercises 2 cabinet reaches x10 flex & abd RUE    Other Standing Exercises Flex 2lb bar 2x10      Shoulder Exercises: ROM/Strengthening   UBE (Upper Arm Bike) L1 x3 each     Other ROM/Strengthening Exercises Rows 15lb & Lats 15lb 2x10      Vasopneumatic   Number Minutes Vasopneumatic  10 minutes    Vasopnuematic Location  Shoulder    Vasopneumatic Pressure Medium    Vasopneumatic Temperature  36      Manual Therapy   Manual Therapy Passive ROM    Manual therapy comments R shoulder all directions to tolerance, soft tissue on insertion of biceps     Passive ROM all GH motions to pain                    PT Short Term Goals - 06/18/20 1058      PT SHORT TERM GOAL #1   Title independent with initial HEP    Status Achieved             PT Long Term Goals - 06/18/20 1058      PT LONG TERM GOAL #1   Title understand posture and body mechanics and the protocol for her surgery    Status Achieved      PT LONG TERM GOAL #2   Title decrease pain Camacho 50%    Status Partially Met      PT LONG TERM GOAL #3   Title increase AROM of right shoulder flexion to 130 degrees    Status Partially Met      PT LONG TERM GOAL #4   Title report able to do seatbelt wihtout pain >3/10    Status On-going      PT LONG TERM GOAL #5   Title report no difficulty dressing and doing hair    Status On-going                 Plan - 06/27/20 1055    Clinical Impression Statement Pt reports pain in both shoulder today. She has progressed tolerating increase weight with lat pull downs, some assist needed with the first set. Soft end feel noted with most passive motions, pain being the only limiting factor. Cues to prevent posterior leaning with seated rows.    Stability/Clinical Decision Making Evolving/Moderate complexity    Rehab Potential Good    PT Frequency 2x / week    PT Duration 8 weeks    PT Treatment/Interventions ADLs/Self Care Home Management;Cryotherapy;Electrical Stimulation;Iontophoresis 95m/ml Dexamethasone;Moist Heat;Ultrasound;Therapeutic exercise;Neuromuscular re-education;Patient/family education;Manual techniques;Vasopneumatic  Device;Therapeutic activities    PT Next Visit Plan Progress AAROM/AROM, Strengthening with protocol           Patient will benefit from skilled therapeutic intervention in order to improve the following deficits and impairments:  Decreased range of motion,Increased  muscle spasms,Impaired UE functional use,Decreased endurance,Decreased activity tolerance,Pain,Improper body mechanics,Postural dysfunction,Decreased strength,Decreased scar mobility,Impaired flexibility,Increased edema  Visit Diagnosis: Stiffness of right shoulder, not elsewhere classified  Localized edema  Cramp and spasm  Acute pain of right shoulder  Acute pain of left shoulder  Stiffness of left shoulder, not elsewhere classified     Problem List Patient Active Problem List   Diagnosis Date Noted  . Closed fracture of right proximal humerus 04/03/2020  . Groin hematoma 12/24/2019  . Angina pectoris ([place]) 12/23/2019  . Long-term current use of opiate analgesic 07/09/2017  . Chronic pain syndrome 07/09/2017  . Lumbar spondylosis 07/09/2017  . 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 Memory disorder 10/30/2015  . 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 Dyspnea on exertion 09/20/2015  . Unstable angina ([place]) 09/20/2015  . URI, acute 09/20/2015  . Obesity, morbid, BMI 40.0-49.9 ([place]) 06/02/2014  . Hypertension   . Hyperlipidemia   . Diabetes ([place]) 12/02/2013  . Depression 05/27/2013  . Osteoporosis 05/02/2013  . GERD (gastroesophageal reflux disease) 05/25/2012  . Hypothyroidism 05/25/2012  . Restless legs syndrome 05/25/2012  . Vitamin D deficiency 05/25/2012    Scot Jun, PTA 06/27/2020, 10:57 AM  [place]. [place], [place], [address] Phone: [phone]   Fax:  [phone]  Name: [name] MRN: [id number] Date of Birth: 23-Aug-1950

## 2020-06-28 DIAGNOSIS — M25512 Pain in left shoulder: Secondary | ICD-10-CM | POA: Diagnosis not present

## 2020-06-28 DIAGNOSIS — Z4789 Encounter for other orthopedic aftercare: Secondary | ICD-10-CM | POA: Diagnosis not present

## 2020-06-29 ENCOUNTER — Ambulatory Visit: Payer: PPO | Admitting: Physical Therapy

## 2020-07-02 ENCOUNTER — Encounter: Payer: Self-pay | Admitting: Physical Therapy

## 2020-07-02 ENCOUNTER — Other Ambulatory Visit: Payer: Self-pay

## 2020-07-02 ENCOUNTER — Ambulatory Visit: Payer: PPO | Admitting: Physical Therapy

## 2020-07-02 DIAGNOSIS — R252 Cramp and spasm: Secondary | ICD-10-CM

## 2020-07-02 DIAGNOSIS — R600 Localized edema: Secondary | ICD-10-CM

## 2020-07-02 DIAGNOSIS — M25611 Stiffness of right shoulder, not elsewhere classified: Secondary | ICD-10-CM

## 2020-07-02 NOTE — Addendum Note (Signed)
Addended by: Sumner Boast on: 07/02/2020 02:56 PM   Modules accepted: Orders

## 2020-07-02 NOTE — Therapy (Signed)
[place] [place], [place], [address] Phone: [phone]   Fax:  [phone]  Physical Therapy Treatment  Patient Details  Name: [name] MRN: [id number] Date of Birth: 1951-06-20 Referring Provider (PT): Victorino December   Encounter Date: 07/02/2020   PT End of Session - 07/02/20 1054    Visit Number 18    Date for PT Re-Evaluation 06/24/20    Authorization Type [employer]    PT Start Time 1014    PT Stop Time 1056    PT Time Calculation (min) 42 min    Activity Tolerance Patient tolerated treatment well    Behavior During Therapy [facility] for tasks assessed/performed           Past Medical History:  Diagnosis Date  . 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 Chronic pain syndrome 07/09/2017  . Coronary artery disease   . Depression   . Diabetes ([place]) 12/02/2013   no current meds   . Dyspnea on exertion 09/20/2015  . Gastro-esophageal reflux   . GERD (gastroesophageal reflux disease) 05/25/2012  . Hyperlipidemia   . Hypertension   . Hypothyroidism 05/25/2012   Last Assessment & Plan:  States was started after a partial thyroidectomy but does not recall any abnormal lab value and no symptoms.  Discussed as is on low dose, would be reasonable to discontinue and recheck in 6-8 weeks which she would like to do. Last Assessment & Plan:  States was started after a partial thyroidectomy but does not recall any abnormal lab value and no symptoms.  Discussed as   . Lumbar spondylosis 07/09/2017  . Memory disorder 10/30/2015  . Obesity, morbid, BMI 40.0-49.9 ([place]) 06/02/2014  . Osteoporosis 05/02/2013   Overview:  DEXA 11/14 DEXA 11/14  . Restless legs syndrome 05/25/2012   Last Assessment & Plan:  patient reports using clonazepam about once weekly .  Discussed nonpharmacologic tips for treating RLS.  States other medicatiosn she had treid had not been effective (does not  remember name) Will refill for now.  Advised if use increases, will readdress in future  . RLS (restless legs syndrome)   . Thyroid disease   . 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 URI, acute 09/20/2015  . Vitamin B 12 deficiency   . Vitamin D deficiency 05/25/2012    Past Surgical History:  Procedure Laterality Date  . ABDOMINAL HYSTERECTOMY    . APPENDECTOMY    . CATARACT EXTRACTION    . CHOLECYSTECTOMY    . CORONARY STENT INTERVENTION N/A 12/23/2019   Procedure: CORONARY STENT INTERVENTION;  Surgeon: Belva Crome, MD;  Location: [place] [facility];  Service: Cardiovascular;  Laterality: N/A;  . JOINT REPLACEMENT     2 total knee replacement  . LASIK    . LEFT HEART CATH AND CORONARY ANGIOGRAPHY N/A 12/23/2019   Procedure: LEFT HEART CATH AND CORONARY ANGIOGRAPHY;  Surgeon: Belva Crome, MD;  Location: [place] [facility];  Service: Cardiovascular;  Laterality: N/A;  . REVERSE SHOULDER ARTHROPLASTY Right 04/03/2020   Procedure: REVERSE SHOULDER ARTHROPLASTY;  Surgeon: Nicholes Stairs, MD;  Location[facility];  Service: Orthopedics;  Laterality: Right;  2.5 hrs  . THYROIDECTOMY    . TONSILLECTOMY      There were no vitals filed for this visit.   Subjective Assessment - 07/02/20 1019    Subjective Went  to MD last Thursday, MD thins she has a rotator cuff tare in LUE. Was told to take it easy with LUE but treat RUE like it was normal    Currently in Pain? Yes    Pain Score 6     Pain Location Shoulder    Pain Orientation Left;Right                             [facility] Adult PT Treatment/Exercise - 07/02/20 0001      Shoulder Exercises: Standing   External Rotation Theraband;20 reps;Strengthening;Both    Theraband Level (Shoulder External Rotation) Level 2 (Red)    Internal Rotation Strengthening;Right;20 reps;Theraband    Theraband Level (Shoulder Internal Rotation) Level 1 (Yellow)    ABduction 20  reps;Strengthening;Weights;Right    Extension Strengthening;Both;20 reps;Weights    Extension Weight (lbs) 5    Row Theraband;20 reps;Both;Strengthening    Theraband Level (Shoulder Row) Level 4 (Blue)    Other Standing Exercises 2 cabinet reaches 2x10 flex & abd RUE 1lb    Other Standing Exercises Tricep Ext 15lb 2x15; Biceps curls 5lb 2x15      Shoulder Exercises: ROM/Strengthening   UBE (Upper Arm Bike) L1 x3 each    Other ROM/Strengthening Exercises Rows 20lb 2x10                    PT Short Term Goals - 06/18/20 1058      PT SHORT TERM GOAL #1   Title independent with initial HEP    Status Achieved             PT Long Term Goals - 06/18/20 1058      PT LONG TERM GOAL #1   Title understand posture and body mechanics and the protocol for her surgery    Status Achieved      PT LONG TERM GOAL #2   Title decrease pain overall 50%    Status Partially Met      PT LONG TERM GOAL #3   Title increase AROM of right shoulder flexion to 130 degrees    Status Partially Met      PT LONG TERM GOAL #4   Title report able to do seatbelt wihtout pain >3/10    Status On-going      PT LONG TERM GOAL #5   Title report no difficulty dressing and doing hair    Status On-going                 Plan - 07/02/20 1055    Clinical Impression Statement Pt did well overall, limited use of LUE due to possible rotator cuff tear in LUE. Pt reports a MRI scheduled. Progressed with resistance doing three level cabinet reaches. Tactile cues to R elbow needed with resisted external rotation. Standing bicep curls and triceps extensions were difficult for pt due to weakness.    Stability/Clinical Decision Making Evolving/Moderate complexity    Rehab Potential Good    PT Frequency 2x / week    PT Duration 8 weeks    PT Treatment/Interventions ADLs/Self Care Home Management;Cryotherapy;Electrical Stimulation;Iontophoresis 79m/ml Dexamethasone;Moist Heat;Ultrasound;Therapeutic  exercise;Neuromuscular re-education;Patient/family education;Manual techniques;Vasopneumatic Device;Therapeutic activities    PT Next Visit Plan Progress AAROM/AROM, Strengthening with protocol           Patient will benefit from skilled therapeutic intervention in order to improve the following deficits and impairments:  Decreased range of motion,Increased muscle spasms,Impaired UE functional use,Decreased endurance,Decreased activity tolerance,Pain,Improper  body mechanics,Postural dysfunction,Decreased strength,Decreased scar mobility,Impaired flexibility,Increased edema  Visit Diagnosis: Stiffness of right shoulder, not elsewhere classified  Cramp and spasm  Localized edema     Problem List Patient Active Problem List   Diagnosis Date Noted  . Closed fracture of right proximal humerus 04/03/2020  . Groin hematoma 12/24/2019  . Angina pectoris ([place]) 12/23/2019  . Long-term current use of opiate analgesic 07/09/2017  . Chronic pain syndrome 07/09/2017  . Lumbar spondylosis 07/09/2017  . 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 Memory disorder 10/30/2015  . 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 Dyspnea on exertion 09/20/2015  . Unstable angina ([place]) 09/20/2015  . URI, acute 09/20/2015  . Obesity, morbid, BMI 40.0-49.9 ([place]) 06/02/2014  . Hypertension   . Hyperlipidemia   . Diabetes ([place]) 12/02/2013  . Depression 05/27/2013  . Osteoporosis 05/02/2013  . GERD (gastroesophageal reflux disease) 05/25/2012  . Hypothyroidism 05/25/2012  . Restless legs syndrome 05/25/2012  . Vitamin D deficiency 05/25/2012    Scot Jun, PTA 07/02/2020, 10:59 AM  [place]. [place], [place], [address] Phone: [phone]   Fax:  [phone]  Name: [name] MRN: [id number] Date of Birth: 1951/01/06

## 2020-07-04 ENCOUNTER — Ambulatory Visit: Payer: PPO | Admitting: Physical Therapy

## 2020-07-09 ENCOUNTER — Ambulatory Visit: Payer: PPO | Admitting: Physical Therapy

## 2020-07-11 ENCOUNTER — Ambulatory Visit: Payer: PPO | Admitting: Physical Therapy

## 2020-07-16 ENCOUNTER — Encounter: Payer: Self-pay | Admitting: Physical Therapy

## 2020-07-16 ENCOUNTER — Ambulatory Visit: Payer: PPO | Admitting: Physical Therapy

## 2020-07-16 ENCOUNTER — Other Ambulatory Visit: Payer: Self-pay

## 2020-07-16 DIAGNOSIS — R252 Cramp and spasm: Secondary | ICD-10-CM

## 2020-07-16 DIAGNOSIS — M25611 Stiffness of right shoulder, not elsewhere classified: Secondary | ICD-10-CM

## 2020-07-16 DIAGNOSIS — R600 Localized edema: Secondary | ICD-10-CM

## 2020-07-16 NOTE — Therapy (Signed)
[place]. [place], [place], [address] Phone: [phone]   Fax:  [phone]  Physical Therapy Treatment  Patient Details  Name: [name] MRN: [id number] Date of Birth: 1951-01-24 Referring Provider (PT): Victorino December   Encounter Date: 07/16/2020   PT End of Session - 07/16/20 1037    Visit Number 19    Date for PT Re-Evaluation 08/03/20    Authorization Type [employer]    PT Start Time 1000    PT Stop Time 1040    PT Time Calculation (min) 40 min    Activity Tolerance Patient tolerated treatment well    Behavior During Therapy [facility] for tasks assessed/performed           Past Medical History:  Diagnosis Date  . 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 Chronic pain syndrome 07/09/2017  . Coronary artery disease   . Depression   . Diabetes ([place]) 12/02/2013   no current meds   . Dyspnea on exertion 09/20/2015  . Gastro-esophageal reflux   . GERD (gastroesophageal reflux disease) 05/25/2012  . Hyperlipidemia   . Hypertension   . Hypothyroidism 05/25/2012   Last Assessment & Plan:  States was started after a partial thyroidectomy but does not recall any abnormal lab value and no symptoms.  Discussed as is on low dose, would be reasonable to discontinue and recheck in 6-8 weeks which she would like to do. Last Assessment & Plan:  States was started after a partial thyroidectomy but does not recall any abnormal lab value and no symptoms.  Discussed as   . Lumbar spondylosis 07/09/2017  . Memory disorder 10/30/2015  . Obesity, morbid, BMI 40.0-49.9 ([place]) 06/02/2014  . Osteoporosis 05/02/2013   Overview:  DEXA 11/14 DEXA 11/14  . Restless legs syndrome 05/25/2012   Last Assessment & Plan:  patient reports using clonazepam about once weekly .  Discussed nonpharmacologic tips for treating RLS.  States other medicatiosn she had treid had not been effective (does not  remember name) Will refill for now.  Advised if use increases, will readdress in future  . RLS (restless legs syndrome)   . Thyroid disease   . 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 URI, acute 09/20/2015  . Vitamin B 12 deficiency   . Vitamin D deficiency 05/25/2012    Past Surgical History:  Procedure Laterality Date  . ABDOMINAL HYSTERECTOMY    . APPENDECTOMY    . CATARACT EXTRACTION    . CHOLECYSTECTOMY    . CORONARY STENT INTERVENTION N/A 12/23/2019   Procedure: CORONARY STENT INTERVENTION;  Surgeon: Belva Crome, MD;  Location: [place] [facility];  Service: Cardiovascular;  Laterality: N/A;  . JOINT REPLACEMENT     2 total knee replacement  . LASIK    . LEFT HEART CATH AND CORONARY ANGIOGRAPHY N/A 12/23/2019   Procedure: LEFT HEART CATH AND CORONARY ANGIOGRAPHY;  Surgeon: Belva Crome, MD;  Location: [facility];  Service: Cardiovascular;  Laterality: N/A;  . REVERSE SHOULDER ARTHROPLASTY Right 04/03/2020   Procedure: REVERSE SHOULDER ARTHROPLASTY;  Surgeon: Nicholes Stairs, MD;  [facility];  Service: Orthopedics;  Laterality: Right;  2.5 hrs  . THYROIDECTOMY    . TONSILLECTOMY      There were no vitals filed for this visit.   Subjective Assessment - 07/16/20 1003    Subjective "A  lot of nothing, bored to mess with all this snow" The R arm is getting by, the L one has been giving her a fit    Currently in Pain? Yes    Pain Score 5    R 3/5   Pain Location Shoulder    Pain Orientation Left;Right              [facility] PT Assessment - 07/16/20 0001      PROM   Right Shoulder Flexion 161 Degrees    Right Shoulder ABduction 129 Degrees    Right Shoulder Internal Rotation 83 Degrees    Right Shoulder External Rotation 67 Degrees                         [facility] Adult PT Treatment/Exercise - 07/16/20 0001      Shoulder Exercises: Standing   External Rotation Theraband;20 reps;Strengthening;Both     Theraband Level (Shoulder External Rotation) Level 2 (Red)    Internal Rotation Strengthening;Right;20 reps;Theraband    Theraband Level (Shoulder Internal Rotation) Level 1 (Yellow);Level 2 (Red)    Flexion Weights;20 reps;Both;Strengthening    Shoulder Flexion Weight (lbs) 2    ABduction 20 reps;Strengthening;Weights;Right    Shoulder ABduction Weight (lbs) 2    Extension Strengthening;Both;20 reps;Weights    Extension Weight (lbs) 5      Shoulder Exercises: ROM/Strengthening   UBE (Upper Arm Bike) L1 x3 each    Other ROM/Strengthening Exercises Rows 20lb 2x10      Shoulder Exercises: Stretch   Other Shoulder Stretches towell stretch                    PT Short Term Goals - 06/18/20 1058      PT SHORT TERM GOAL #1   Title independent with initial HEP    Status Achieved             PT Long Term Goals - 07/16/20 1038      PT LONG TERM GOAL #1   Title understand posture and body mechanics and the protocol for her surgery    Status Achieved      PT LONG TERM GOAL #2   Title decrease pain overall 50%    Status Achieved      PT LONG TERM GOAL #3   Title increase AROM of right shoulder flexion to 130 degrees    Status Achieved      PT LONG TERM GOAL #4   Title report able to do seatbelt wihtout pain >3/10    Status Partially Met      PT LONG TERM GOAL #5   Title report no difficulty dressing and doing hair    Status Partially Met                 Plan - 07/16/20 1039    Clinical Impression Statement Pt has progressed towards goals and has met some goals. he has increase her RUE AROM in all directions. She report improvement everyday. No issues with today's interventions, she was unable to complete shoulder abduction with LUE. She reports being pleased with her RUE overall, despite some weakness that remains. She continues to have some difficulty reaching behind her back and touching the back of her head. She expresses that today was her last day,   reports that her co pay has doubled and will be returning to therapy after she  has surgery on her LUE    Stability/Clinical Decision Making Evolving/Moderate  complexity    Rehab Potential Good    PT Frequency 2x / week    PT Duration 8 weeks    PT Treatment/Interventions ADLs/Self Care Home Management;Cryotherapy;Electrical Stimulation;Iontophoresis 80m/ml Dexamethasone;Moist Heat;Ultrasound;Therapeutic exercise;Neuromuscular re-education;Patient/family education;Manual techniques;Vasopneumatic Device;Therapeutic activities    PT Next Visit Plan D/C PT           Patient will benefit from skilled therapeutic intervention in order to improve the following deficits and impairments:  Decreased range of motion,Increased muscle spasms,Impaired UE functional use,Decreased endurance,Decreased activity tolerance,Pain,Improper body mechanics,Postural dysfunction,Decreased strength,Decreased scar mobility,Impaired flexibility,Increased edema  Visit Diagnosis: Localized edema  Cramp and spasm  Stiffness of right shoulder, not elsewhere classified     Problem List Patient Active Problem List   Diagnosis Date Noted  . Closed fracture of right proximal humerus 04/03/2020  . Groin hematoma 12/24/2019  . [place] p[place]is ([place] 12/23/2019  . Long-term current use of opiate analgesic 07/09/2017  . Chronic pain syndrome 07/09/2017  . Lumbar spondylosis 07/09/2017  . 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 Memory disorder 10/30/2015  . 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 Dyspnea on exertion 09/20/2015  . Unstable angina ([place] 09/20/2015  . URI, acute 09/20/2015  . Obesity, morbid, BMI 40.0-49.9 ([place] 06/02/2014  . Hypertension   . Hyperlipidemia   . Diabetes ([place] 12/02/2013  . Depression 05/27/2013  . Osteoporosis 05/02/2013  . GERD (gastroesophageal reflux disease) 05/25/2012  . Hypothyroidism 05/25/2012  . Restless legs syndrome 05/25/2012  . Vitamin D deficiency 05/25/2012    ASumner Boast  PTA 07/16/2020, 11:54 AM  [place] [address]Phone: [phone]  Fax:  [phone] Name: Lisa RAMEYMRN: [id number] of Birth: 6September 07, 1952

## 2020-07-18 ENCOUNTER — Ambulatory Visit: Payer: PPO | Admitting: Physical Therapy

## 2020-07-20 ENCOUNTER — Other Ambulatory Visit: Payer: Self-pay

## 2020-07-20 MED ORDER — CLOPIDOGREL BISULFATE 75 MG PO TABS: 75.0000 mg | ORAL_TABLET | Freq: Every day | ORAL | 3 refills | Status: DC

## 2020-07-20 MED ORDER — LOSARTAN POTASSIUM 50 MG PO TABS: 50.0000 mg | ORAL_TABLET | Freq: Every day | ORAL | 3 refills | Status: DC

## 2020-07-20 MED ORDER — ATORVASTATIN CALCIUM 80 MG PO TABS: 80.0000 mg | ORAL_TABLET | Freq: Every day | ORAL | 3 refills | Status: DC

## 2020-07-20 MED ORDER — METOPROLOL SUCCINATE ER 25 MG PO TB24: 25.0000 mg | ORAL_TABLET | Freq: Every day | ORAL | 3 refills | Status: DC

## 2020-07-23 ENCOUNTER — Telehealth: Payer: Self-pay | Admitting: Interventional Cardiology

## 2020-07-23 DIAGNOSIS — M7542 Impingement syndrome of left shoulder: Secondary | ICD-10-CM | POA: Diagnosis not present

## 2020-07-23 DIAGNOSIS — T148XXA Other injury of unspecified body region, initial encounter: Secondary | ICD-10-CM

## 2020-07-23 NOTE — Telephone Encounter (Signed)
Spoke with pt and she states that she wakes up every morning with new bruises all over her.  Denies bumping body into anything.  Currently has a bruise that stretches from her knee to her ankle.  Denies any pain.  Denies blood in urine, stool or nose.  Scheduled pt to come in tomorrow for a CBC to eval Hgb and platelets.  Advised I will send to Dr. Tamala Julian for review and will call once he looks at everything.  Pt appreciative for call.

## 2020-07-23 NOTE — Telephone Encounter (Signed)
Stop aspirin. If continues, may need to stop Plavix and use ASA alone.Labs are helpful to see prior to doing anything.

## 2020-07-23 NOTE — Telephone Encounter (Signed)
Pt c/o medication issue:  1. Name of Medication: clopidogrel (PLAVIX) 75 MG tablet    2. How are you currently taking this medication (dosage and times per day)? As prescribed.  3. Are you having a reaction (difficulty breathing--STAT)? No.  4. What is your medication issue? Patient states that she is having bruising and she thinks its due to this medication, she says she has a big bruise from her knee to ankle and is concerned. Please advise.

## 2020-07-24 ENCOUNTER — Other Ambulatory Visit: Payer: Self-pay

## 2020-07-24 ENCOUNTER — Other Ambulatory Visit: Payer: PPO

## 2020-07-24 ENCOUNTER — Telehealth: Payer: Self-pay | Admitting: Interventional Cardiology

## 2020-07-24 DIAGNOSIS — T148XXA Other injury of unspecified body region, initial encounter: Secondary | ICD-10-CM | POA: Diagnosis not present

## 2020-07-24 LAB — CBC
Hematocrit: 40 % (ref 34.0–46.6)
Hemoglobin: 13.4 g/dL (ref 11.1–15.9)
MCH: 29.8 pg (ref 26.6–33.0)
MCHC: 33.5 g/dL (ref 31.5–35.7)
MCV: 89 fL (ref 79–97)
Platelets: 219 10*3/uL (ref 150–450)
RBC: 4.5 x10E6/uL (ref 3.77–5.28)
RDW: 14.8 % (ref 11.7–15.4)
WBC: 6.1 10*3/uL (ref 3.4–10.8)

## 2020-07-25 DIAGNOSIS — M7542 Impingement syndrome of left shoulder: Secondary | ICD-10-CM | POA: Diagnosis not present

## 2020-07-25 DIAGNOSIS — M25511 Pain in right shoulder: Secondary | ICD-10-CM | POA: Diagnosis not present

## 2020-07-25 NOTE — Telephone Encounter (Signed)
Labs came back normal.  Made pt aware to stop ASA.  Advised to continue to monitor and let us know how things are when she sees Dr. Tamala Julian on 2/25.  Pt verbalized understanding and was in agreement with this plan.

## 2020-07-27 DIAGNOSIS — M5416 Radiculopathy, lumbar region: Secondary | ICD-10-CM | POA: Diagnosis not present

## 2020-07-27 DIAGNOSIS — M5136 Other intervertebral disc degeneration, lumbar region: Secondary | ICD-10-CM | POA: Diagnosis not present

## 2020-07-27 DIAGNOSIS — D6869 Other thrombophilia: Secondary | ICD-10-CM | POA: Diagnosis not present

## 2020-07-27 DIAGNOSIS — G894 Chronic pain syndrome: Secondary | ICD-10-CM | POA: Diagnosis not present

## 2020-08-03 ENCOUNTER — Ambulatory Visit: Payer: PPO | Attending: Orthopedic Surgery | Admitting: Physical Therapy

## 2020-08-03 ENCOUNTER — Encounter: Payer: Self-pay | Admitting: Physical Therapy

## 2020-08-03 ENCOUNTER — Other Ambulatory Visit: Payer: Self-pay

## 2020-08-03 DIAGNOSIS — R600 Localized edema: Secondary | ICD-10-CM

## 2020-08-03 DIAGNOSIS — M25612 Stiffness of left shoulder, not elsewhere classified: Secondary | ICD-10-CM

## 2020-08-03 DIAGNOSIS — R252 Cramp and spasm: Secondary | ICD-10-CM

## 2020-08-03 DIAGNOSIS — M25512 Pain in left shoulder: Secondary | ICD-10-CM | POA: Insufficient documentation

## 2020-08-03 NOTE — Therapy (Signed)
[place]. [place], [place], [address] Phone: [phone]   Fax:  [phone]  Physical Therapy Evaluation  Patient Details  Name: [name] MRN: [id number] Date of Birth: 05/07/1951 Referring Provider (PT): Victorino December   Encounter Date: 08/03/2020   PT End of Session - 08/03/20 0956    Visit Number 1    Date for PT Re-Evaluation 10/01/20    PT Start Time 0911    PT Stop Time 0950    PT Time Calculation (min) 39 min    Activity Tolerance Patient tolerated treatment well    Behavior During Therapy [facility] for tasks assessed/performed           Past Medical History:  Diagnosis Date  . 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 Chronic pain syndrome 07/09/2017  . Coronary artery disease   . Depression   . Diabetes ([place]) 12/02/2013   no current meds   . Dyspnea on exertion 09/20/2015  . Gastro-esophageal reflux   . GERD (gastroesophageal reflux disease) 05/25/2012  . Hyperlipidemia   . Hypertension   . Hypothyroidism 05/25/2012   Last Assessment & Plan:  States was started after a partial thyroidectomy but does not recall any abnormal lab value and no symptoms.  Discussed as is on low dose, would be reasonable to discontinue and recheck in 6-8 weeks which she would like to do. Last Assessment & Plan:  States was started after a partial thyroidectomy but does not recall any abnormal lab value and no symptoms.  Discussed as   . Lumbar spondylosis 07/09/2017  . Memory disorder 10/30/2015  . Obesity, morbid, BMI 40.0-49.9 ([place]) 06/02/2014  . Osteoporosis 05/02/2013   Overview:  DEXA 11/14 DEXA 11/14  . Restless legs syndrome 05/25/2012   Last Assessment & Plan:  patient reports using clonazepam about once weekly .  Discussed nonpharmacologic tips for treating RLS.  States other medicatiosn she had treid had not been effective (does not remember name) Will refill for now.  Advised if  use increases, will readdress in future  . RLS (restless legs syndrome)   . Thyroid disease   . 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 URI, acute 09/20/2015  . Vitamin B 12 deficiency   . Vitamin D deficiency 05/25/2012    Past Surgical History:  Procedure Laterality Date  . ABDOMINAL HYSTERECTOMY    . APPENDECTOMY    . CATARACT EXTRACTION    . CHOLECYSTECTOMY    . CORONARY STENT INTERVENTION N/A 12/23/2019   Procedure: CORONARY STENT INTERVENTION;  Surgeon: Belva Crome, MD;  Location: [place] [facility];  Service: Cardiovascular;  Laterality: N/A;  . JOINT REPLACEMENT     2 total knee replacement  . LASIK    . LEFT HEART CATH AND CORONARY ANGIOGRAPHY N/A 12/23/2019   Procedure: LEFT HEART CATH AND CORONARY ANGIOGRAPHY;  Surgeon: Belva Crome, MD;  Location: [place] [facility];  Service: Cardiovascular;  Laterality: N/A;  . REVERSE SHOULDER ARTHROPLASTY Right 04/03/2020   Procedure: REVERSE SHOULDER ARTHROPLASTY;  Surgeon: Nicholes Stairs, MD;  Location: [facility];  Service: Orthopedics;  Laterality: Right;  2.5 hrs  . THYROIDECTOMY    . TONSILLECTOMY      There were no vitals filed for this visit.    Subjective Assessment - 08/03/20 0911    Subjective Pt was here for R shoulder TSA  earlier this year, returns for L shoulder RC tear. Pt has had MRI since discharged from PT; states it shows a rotator cuff tear on the L side. Pain started with fall on 03/21/2020. Pt is pursuing physical therapy before considering surgery on L shoulder. Pt reports that she is having difficulty reaching behind head, reaching overhead, and lifting items. Pt denies radiating pain into L UE, denies N/T. Pt still having some residual R shoulder pain which she rates at 4/10.    Limitations Lifting;Writing;House hold activities    Patient Stated Goals have less pain, dress and do hair without difficulty, have better motions    Currently in Pain? Yes    Pain Score 2      Pain Location Shoulder    Pain Orientation Left    Pain Descriptors / Indicators Aching;Sharp;Dull    Pain Type Acute pain    Aggravating Factors  reaching overhead, washing hair, lifting, reaching behind back    Pain Relieving Factors pain meds, rest, heat    Effect of Pain on Daily Activities all ADL's are difficult              [facility] PT Assessment - 08/03/20 0001      Assessment   Medical Diagnosis L RC tear    Referring Provider (PT) Victorino December    Onset Date/Surgical Date 03/21/20    Hand Dominance Right    Next MD Visit --   returns in 6 weeks   Prior Therapy PT for R shoulder TSA      Precautions   Precautions None      Restrictions   Weight Bearing Restrictions No      Balance Screen   Has the patient fallen in the past 6 months Yes    How many times? 1    Has the patient had a decrease in activity level because of a fear of falling?  Yes    Is the patient reluctant to leave their home because of a fear of falling?  No      Home Environment   Additional Comments does housework, cooks and cleans      Prior Function   Level of Independence Independent    Vocation Part time employment    Vocation Requirements mostly on the computer    Leisure no exercise      Sensation   Light Touch Appears Intact      Posture/Postural Control   Posture Comments fwd head, rounded shoulders      ROM / Strength   AROM / PROM / Strength AROM;PROM;Strength      AROM   Right/Left Shoulder Right;Left    Right Shoulder Flexion 135 Degrees    Right Shoulder ABduction 160 Degrees    Right Shoulder Internal Rotation 65 Degrees    Right Shoulder External Rotation 60 Degrees    Left Shoulder Flexion 125 Degrees    Left Shoulder ABduction 80 Degrees    Left Shoulder Internal Rotation 20 Degrees    Left Shoulder External Rotation 35 Degrees    Right/Left Elbow Right;Left    Right Elbow Flexion --   elbow ROM WFL B     PROM   Left Shoulder Flexion 135 Degrees    Left  Shoulder ABduction 95 Degrees    Left Shoulder Internal Rotation 85 Degrees    Left Shoulder External Rotation 60 Degrees      Strength   Strength Assessment Site Shoulder;Elbow    Right/Left Shoulder Right;Left  Right Shoulder Flexion 4+/5    Right Shoulder ABduction 4+/5    Right Shoulder Internal Rotation 4+/5    Right Shoulder External Rotation 4+/5    Left Shoulder Flexion 4/5    Left Shoulder ABduction 4/5    Left Shoulder Internal Rotation 4-/5    Left Shoulder External Rotation 4-/5    Right/Left Elbow Right;Left    Right Elbow Flexion 5/5    Right Elbow Extension 5/5    Left Elbow Flexion 5/5    Left Elbow Extension 5/5      Palpation   Palpation comment tender to palpation L biceps, teres major, deltoid      Special Tests    Special Tests Rotator Cuff Impingement    Rotator Cuff Impingment tests Empty Can test;Hawkins- Kennedy test;Speed's test      Hawkins-Kennedy test   Findings Positive    Side Left      Empty Can test   Findings Positive    Side Left      Speed's test   Findings Negative    Side Left                      Objective measurements completed on examination: See above findings.       [place] Adult PT Treatment/Exercise - 08/03/20 0001      Shoulder Exercises: Standing   Other Standing Exercises L shoulder isometrics all directions x10; pendulums; B shoulder rows with red TB                  PT Education - 08/03/20 0956    Education Details Pt educated on POC and HEP    Person(s) Educated Patient    Methods Explanation;Demonstration;Handout    Comprehension Verbalized understanding;Returned demonstration            PT Short Term Goals - 08/03/20 1002      PT SHORT TERM GOAL #1   Title independent with initial HEP    Time 2    Period Weeks    Status New    Target Date 08/17/20             PT Long Term Goals - 08/03/20 1002      PT LONG TERM GOAL #1   Title understand posture and body mechanics     Time 6    Period Weeks    Status New    Target Date 09/14/20      PT LONG TERM GOAL #2   Title decrease L shoulder pain overall 50%    Time 6    Period Weeks    Status New    Target Date 09/14/20      PT LONG TERM GOAL #3   Title increase AROM of left shoulder flexion/abduction to 140 degrees    Baseline 125 flexion, 80 abduction    Time 6    Period Weeks    Status New    Target Date 09/14/20      PT LONG TERM GOAL #4   Title Pt able to put on jacket/wash hair without L shoulder pain >3/10    Time 6    Period Weeks    Status New    Target Date 09/14/20                  Plan - 08/03/20 0957    Clinical Impression Statement Pt presents to clinic with diagnosis of L RC tear confirmed by MRI. Pt was seen  earlier this year for R shoulder TSA; is doing well with R shoulder still working on ROM and strengthening ex's at home. Would like to try 4-6 weeks of physical therapy for L shoulder before deciding to move forward with RC surgery. Demos positive impingement tests, L shoulder strength limited +pain, L shoulder ROM deficits, and difficulty with ADLs d/t L shoulder pain. Pt reporting copay has increased; discussed doing 2x/week for a couple of weeks and then transitioning to 1x/week with updated HEP to help relieve financial burden. Prescribed ex's for strengthening and educated on continuance of ex's prescribed previously with pt VU. Pt would benefit from skilled PT to address the above impairments.    Examination-Activity Limitations Reach Overhead;Lift    Examination-Participation Restrictions Community Activity;Interpersonal Relationship    Stability/Clinical Decision Making Stable/Uncomplicated    Clinical Decision Making Low    Rehab Potential Good    PT Frequency 2x / week    PT Duration 6 weeks    PT Treatment/Interventions ADLs/Self Care Home Management;Cryotherapy;Electrical Stimulation;Iontophoresis 4mg /ml Dexamethasone;Moist Heat;Ultrasound;Therapeutic  exercise;Neuromuscular re-education;Patient/family education;Manual techniques;Vasopneumatic Device;Therapeutic activities    PT Next Visit Plan L shoulder strengthening and ROM ex's, manual/modalities as indicated, scap stab    PT Home Exercise Plan see pt instructions    Consulted and Agree with Plan of Care Patient           Patient will benefit from skilled therapeutic intervention in order to improve the following deficits and impairments:  Decreased range of motion,Increased muscle spasms,Impaired UE functional use,Decreased endurance,Decreased activity tolerance,Pain,Improper body mechanics,Postural dysfunction,Decreased strength,Decreased scar mobility,Impaired flexibility,Increased edema  Visit Diagnosis: Localized edema  Acute pain of left shoulder  Stiffness of left shoulder, not elsewhere classified  Cramp and spasm     Problem List Patient Active Problem List   Diagnosis Date Noted  . Closed fracture of right proximal humerus 04/03/2020  . Groin hematoma 12/24/2019  . Angina pectoris ([place]) 12/23/2019  . Long-term current use of opiate analgesic 07/09/2017  . Chronic pain syndrome 07/09/2017  . Lumbar spondylosis 07/09/2017  . 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 Memory disorder 10/30/2015  . 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 Dyspnea on exertion 09/20/2015  . Unstable angina ([place]) 09/20/2015  . URI, acute 09/20/2015  . Obesity, morbid, BMI 40.0-49.9 ([place]) 06/02/2014  . Hypertension   . Hyperlipidemia   . Diabetes ([place]) 12/02/2013  . Depression 05/27/2013  . Osteoporosis 05/02/2013  . GERD (gastroesophageal reflux disease) 05/25/2012  . Hypothyroidism 05/25/2012  . Restless legs syndrome 05/25/2012  . Vitamin D deficiency 05/25/2012   Amador Cunas, PT, DPT Donald Prose Lamonda Noxon 08/03/2020, 10:06 AM  [place]. [place], [place], [address] Phone: [phone]   Fax:  [phone]  Name: [name] MRN: [id number] Date of Birth: October 09, 1950

## 2020-08-03 NOTE — Patient Instructions (Signed)
Access Code: [email or web address] URL: [email or web address] Date: 08/03/2020 Prepared by: Amador Cunas  Exercises Circular Shoulder Pendulum with Table Support - 1 x daily - 7 x weekly - 3 sets - 10 reps Isometric Shoulder Flexion at Wall - 1 x daily - 7 x weekly - 3 sets - 10 reps Standing Isometric Shoulder Internal Rotation at Doorway - 1 x daily - 7 x weekly - 3 sets - 10 reps Isometric Shoulder External Rotation at Wall - 1 x daily - 7 x weekly - 3 sets - 10 reps Isometric Shoulder Extension at Wall - 1 x daily - 7 x weekly - 3 sets - 10 reps Isometric Shoulder Abduction at Wall - 1 x daily - 7 x weekly - 3 sets - 10 reps Scapular Retraction with Resistance - 1 x daily - 7 x weekly - 3 sets - 10 reps

## 2020-08-07 ENCOUNTER — Encounter: Payer: Self-pay | Admitting: Physical Therapy

## 2020-08-07 ENCOUNTER — Ambulatory Visit: Payer: PPO | Admitting: Physical Therapy

## 2020-08-07 ENCOUNTER — Other Ambulatory Visit: Payer: Self-pay

## 2020-08-07 DIAGNOSIS — R600 Localized edema: Secondary | ICD-10-CM | POA: Diagnosis not present

## 2020-08-07 DIAGNOSIS — M25612 Stiffness of left shoulder, not elsewhere classified: Secondary | ICD-10-CM

## 2020-08-07 DIAGNOSIS — M25512 Pain in left shoulder: Secondary | ICD-10-CM

## 2020-08-07 NOTE — Therapy (Signed)
[place]. [place], [place], [address] Phone: [phone]   Fax:  [phone]  Physical Therapy Treatment  Patient Details  Name: [name] MRN: [id number] Date of Birth: [birth year] Referring Provider (PT): Victorino December   Encounter Date: 08/07/2020   PT End of Session - 08/07/20 1006    Visit Number 2    Date for PT Re-Evaluation 10/01/20    Authorization Type [employer]    PT Start Time 0927    PT Stop Time 1008    PT Time Calculation (min) 41 min    Activity Tolerance Patient tolerated treatment well    Behavior During Therapy [facility] for tasks assessed/performed           Past Medical History:  Diagnosis Date  . 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 Chronic pain syndrome 07/09/2017  . Coronary artery disease   . Depression   . Diabetes ([place]) 12/02/2013   no current meds   . Dyspnea on exertion 09/20/2015  . Gastro-esophageal reflux   . GERD (gastroesophageal reflux disease) 05/25/2012  . Hyperlipidemia   . Hypertension   . Hypothyroidism 05/25/2012   Last Assessment & Plan:  States was started after a partial thyroidectomy but does not recall any abnormal lab value and no symptoms.  Discussed as is on low dose, would be reasonable to discontinue and recheck in 6-8 weeks which she would like to do. Last Assessment & Plan:  States was started after a partial thyroidectomy but does not recall any abnormal lab value and no symptoms.  Discussed as   . Lumbar spondylosis 07/09/2017  . Memory disorder 10/30/2015  . Obesity, morbid, BMI 40.0-49.9 ([place]) 06/02/2014  . Osteoporosis 05/02/2013   Overview:  DEXA 11/14 DEXA 11/14  . Restless legs syndrome 05/25/2012   Last Assessment & Plan:  patient reports using clonazepam about once weekly .  Discussed nonpharmacologic tips for treating RLS.  States other medicatiosn she had treid had not been effective (does not  remember name) Will refill for now.  Advised if use increases, will readdress in future  . RLS (restless legs syndrome)   . Thyroid disease   . 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 URI, acute 09/20/2015  . Vitamin B 12 deficiency   . Vitamin D deficiency 05/25/2012    Past Surgical History:  Procedure Laterality Date  . ABDOMINAL HYSTERECTOMY    . APPENDECTOMY    . CATARACT EXTRACTION    . CHOLECYSTECTOMY    . CORONARY STENT INTERVENTION N/A 12/23/2019   Procedure: CORONARY STENT INTERVENTION;  Surgeon: Belva Crome, MD;  Location: [place] [facility];  Service: Cardiovascular;  Laterality: N/A;  . JOINT REPLACEMENT     2 total knee replacement  . LASIK    . LEFT HEART CATH AND CORONARY ANGIOGRAPHY N/A 12/23/2019   Procedure: LEFT HEART CATH AND CORONARY ANGIOGRAPHY;  Surgeon: Belva Crome, MD;  Location: [place] [facility];  Service: Cardiovascular;  Laterality: N/A;  . REVERSE SHOULDER ARTHROPLASTY Right 04/03/2020   Procedure: REVERSE SHOULDER ARTHROPLASTY;  Surgeon: Nicholes Stairs, MD;  Location: [facility];  Service: Orthopedics;  Laterality: Right;  2.5 hrs  . THYROIDECTOMY    . TONSILLECTOMY      There were no vitals filed for this visit.   Subjective Assessment - 08/07/20 0931    Subjective Feeling  ok, L shoulder pain hurts when she moves it certain ways. Pt reports that she want surgery by MD wants to try PT first.    Currently in Pain? No/denies                             [facility] Adult PT Treatment/Exercise - 08/07/20 0001      Shoulder Exercises: Supine   Other Supine Exercises LUE ER/IR 2lb x 10      Shoulder Exercises: Standing   External Rotation Strengthening;Left;20 reps;Theraband    Theraband Level (Shoulder External Rotation) Level 1 ([place]ellow)    Internal Rotation Strengthening;Left;20 reps;Theraband    Theraband Level (Shoulder Internal Rotation) Level 1 (Yellow)    Flexion Weights;20  reps;Both;Strengthening    Shoulder Flexion Weight (lbs) 2    Extension Strengthening;Both;20 reps;Theraband    Theraband Level (Shoulder Extension) Level 3 (Green)    Row Theraband;20 reps;Both;Strengthening    Theraband Level (Shoulder Row) Level 3 (Green)    Other Standing Exercises AAROM1lb flex, IR up back, Ext x10      Shoulder Exercises: ROM/Strengthening   UBE (Upper Arm Bike) L1 x3 each      Manual Therapy   Manual Therapy Passive ROM    Manual therapy comments L shoulder all directions to tolerance, soft tissue on insertion of biceps                    PT Short Term Goals - 08/03/20 1002      PT SHORT TERM GOAL #1   Title independent with initial HEP    Time 2    Period Weeks    Status New    Target Date 08/17/20             PT Long Term Goals - 08/03/20 1002      PT LONG TERM GOAL #1   Title understand posture and body mechanics    Time 6    Period Weeks    Status New    Target Date 09/14/20      PT LONG TERM GOAL #2   Title decrease L shoulder pain overall 50%    Time 6    Period Weeks    Status New    Target Date 09/14/20      PT LONG TERM GOAL #3   Title increase AROM of left shoulder flexion/abduction to 140 degrees    Baseline 125 flexion, 80 abduction    Time 6    Period Weeks    Status New    Target Date 09/14/20      PT LONG TERM GOAL #4   Title Pt able to put on jacket/wash hair without L shoulder pain >3/10    Time 6    Period Weeks    Status New    Target Date 09/14/20                 Plan - 08/07/20 1009    Clinical Impression Statement Pt has confirmed L RC tear via MRI. She stated MS wants to try PT compared to surgery. Some visible LUE weakness noted throughout session more so with active flex and abd with little shoulder elevation. She did well with LUE ER/IR but required tactile cues for arm placement. Some pain at end range with MT. Supine ER was much weakness than IR.    Examination-Activity Limitations  Reach Overhead;Lift    Examination-Participation Restrictions Community Activity;Interpersonal Relationship  Stability/Clinical Decision Making Stable/Uncomplicated    Rehab Potential Good    PT Frequency 2x / week    PT Duration 6 weeks    PT Treatment/Interventions ADLs/Self Care Home Management;Cryotherapy;Electrical Stimulation;Iontophoresis 4mg /ml Dexamethasone;Moist Heat;Ultrasound;Therapeutic exercise;Neuromuscular re-education;Patient/family education;Manual techniques;Vasopneumatic Device;Therapeutic activities    PT Next Visit Plan L shoulder strengthening and ROM ex's, manual/modalities as indicated, scap stab           Patient will benefit from skilled therapeutic intervention in order to improve the following deficits and impairments:  Decreased range of motion,Increased muscle spasms,Impaired UE functional use,Decreased endurance,Decreased activity tolerance,Pain,Improper body mechanics,Postural dysfunction,Decreased strength,Decreased scar mobility,Impaired flexibility,Increased edema  Visit Diagnosis: Acute pain of left shoulder  Localized edema  Stiffness of left shoulder, not elsewhere classified     Problem List Patient Active Problem List   Diagnosis Date Noted  . Closed fracture of right proximal humerus 04/03/2020  . Groin hematoma 12/24/2019  . Angina pectoris ([place]) 12/23/2019  . Long-term current use of opiate analgesic 07/09/2017  . Chronic pain syndrome 07/09/2017  . Lumbar spondylosis 07/09/2017  . 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 Memory disorder 10/30/2015  . 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 Dyspnea on exertion 09/20/2015  . Unstable angina ([place]) 09/20/2015  . URI, acute 09/20/2015  . Obesity, morbid, BMI 40.0-49.9 ([place]) 06/02/2014  . Hypertension   . Hyperlipidemia   . Diabetes ([place]) 12/02/2013  . Depression 05/27/2013  . Osteoporosis 05/02/2013  . GERD (gastroesophageal reflux disease) 05/25/2012  . Hypothyroidism 05/25/2012  . Restless legs  syndrome 05/25/2012  . Vitamin D deficiency 05/25/2012    Scot Jun, PTA 08/07/2020, 10:12 AM  [place]. [place], [place], [address] Phone: [phone]   Fax:  [phone]  Name: CAMBREIGH DEARING MRN: [id number] Date of Birth: 11/08/1950

## 2020-08-09 ENCOUNTER — Ambulatory Visit: Payer: PPO | Admitting: Physical Therapy

## 2020-08-09 ENCOUNTER — Other Ambulatory Visit: Payer: Self-pay

## 2020-08-09 ENCOUNTER — Encounter: Payer: Self-pay | Admitting: Physical Therapy

## 2020-08-09 DIAGNOSIS — R600 Localized edema: Secondary | ICD-10-CM

## 2020-08-09 DIAGNOSIS — R252 Cramp and spasm: Secondary | ICD-10-CM

## 2020-08-09 DIAGNOSIS — M25612 Stiffness of left shoulder, not elsewhere classified: Secondary | ICD-10-CM

## 2020-08-09 DIAGNOSIS — M25512 Pain in left shoulder: Secondary | ICD-10-CM

## 2020-08-09 NOTE — Therapy (Signed)
[place]. [place], [place], [address] Phone: [phone]   Fax:  [phone]  Physical Therapy Treatment  Patient Details  Name: [name] MRN: [id number] Date of Birth: Apr 07, 1951 Referring Provider (PT): Victorino December   Encounter Date: 08/09/2020   PT End of Session - 08/09/20 1004    Visit Number 3    Date for PT Re-Evaluation 10/01/20    Authorization Type [employer]    PT Start Time 0927    PT Stop Time 1009    PT Time Calculation (min) 42 min    Activity Tolerance Patient tolerated treatment well    Behavior During Therapy [facility] for tasks assessed/performed           Past Medical History:  Diagnosis Date  . 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 Chronic pain syndrome 07/09/2017  . Coronary artery disease   . Depression   . Diabetes ([place]) 12/02/2013   no current meds   . Dyspnea on exertion 09/20/2015  . Gastro-esophageal reflux   . GERD (gastroesophageal reflux disease) 05/25/2012  . Hyperlipidemia   . Hypertension   . Hypothyroidism 05/25/2012   Last Assessment & Plan:  States was started after a partial thyroidectomy but does not recall any abnormal lab value and no symptoms.  Discussed as is on low dose, would be reasonable to discontinue and recheck in 6-8 weeks which she would like to do. Last Assessment & Plan:  States was started after a partial thyroidectomy but does not recall any abnormal lab value and no symptoms.  Discussed as   . Lumbar spondylosis 07/09/2017  . Memory disorder 10/30/2015  . Obesity, morbid, BMI 40.0-49.9 ([place]) 06/02/2014  . Osteoporosis 05/02/2013   Overview:  DEXA 11/14 DEXA 11/14  . Restless legs syndrome 05/25/2012   Last Assessment & Plan:  patient reports using clonazepam about once weekly .  Discussed nonpharmacologic tips for treating RLS.  States other medicatiosn she had treid had not been effective (does not  remember name) Will refill for now.  Advised if use increases, will readdress in future  . RLS (restless legs syndrome)   . Thyroid disease   . 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 URI, acute 09/20/2015  . Vitamin B 12 deficiency   . Vitamin D deficiency 05/25/2012    Past Surgical History:  Procedure Laterality Date  . ABDOMINAL HYSTERECTOMY    . APPENDECTOMY    . CATARACT EXTRACTION    . CHOLECYSTECTOMY    . CORONARY STENT INTERVENTION N/A 12/23/2019   Procedure: CORONARY STENT INTERVENTION;  Surgeon: Belva Crome, MD;  Location: [place] [facility];  Service: Cardiovascular;  Laterality: N/A;  . JOINT REPLACEMENT     2 total knee replacement  . LASIK    . LEFT HEART CATH AND CORONARY ANGIOGRAPHY N/A 12/23/2019   Procedure: LEFT HEART CATH AND CORONARY ANGIOGRAPHY;  Surgeon: Belva Crome, MD;  Location: [place] [facility];  Service: Cardiovascular;  Laterality: N/A;  . REVERSE SHOULDER ARTHROPLASTY Right 04/03/2020   Procedure: REVERSE SHOULDER ARTHROPLASTY;  Surgeon: Nicholes Stairs, MD;  [facility];  Service: Orthopedics;  Laterality: Right;  2.5 hrs  . THYROIDECTOMY    . TONSILLECTOMY      There were no vitals filed for this visit.   Subjective Assessment - 08/09/20 0928    Subjective Feeling  ok, some soreness after last session    Currently in Pain? Yes    Pain Score 2     Pain Location Shoulder    Pain Orientation Left;Right                             [facility] Adult PT Treatment/Exercise - 08/09/20 0001      Shoulder Exercises: Supine   Flexion Left;Strengthening;20 reps;Weights    Shoulder Flexion Weight (lbs) 2    Other Supine Exercises LUE ER/IR 2lb x 10      Shoulder Exercises: Standing   Flexion Weights;20 reps;Both;Strengthening    Shoulder Flexion Weight (lbs) 2    ABduction 20 reps;Strengthening;Weights;Right    Shoulder ABduction Weight (lbs) 2    Extension Strengthening;Both;20  reps;Theraband    Theraband Level (Shoulder Extension) Level 3 (Green)    Row Theraband;20 reps;Both;Strengthening    Theraband Level (Shoulder Row) Level 3 (Green)    Other Standing Exercises AAROM 3lb flex, IR up back, Ext x10      Shoulder Exercises: ROM/Strengthening   UBE (Upper Arm Bike) L1 x3    Nustep L3 x 4 min      Manual Therapy   Manual Therapy Passive ROM    Manual therapy comments L shoulder all directions to tolerance, soft tissue on insertion of biceps                    PT Short Term Goals - 08/03/20 1002      PT SHORT TERM GOAL #1   Title independent with initial HEP    Time 2    Period Weeks    Status New    Target Date 08/17/20             PT Long Term Goals - 08/03/20 1002      PT LONG TERM GOAL #1   Title understand posture and body mechanics    Time 6    Period Weeks    Status New    Target Date 09/14/20      PT LONG TERM GOAL #2   Title decrease L shoulder pain overall 50%    Time 6    Period Weeks    Status New    Target Date 09/14/20      PT LONG TERM GOAL #3   Title increase AROM of left shoulder flexion/abduction to 140 degrees    Baseline 125 flexion, 80 abduction    Time 6    Period Weeks    Status New    Target Date 09/14/20      PT LONG TERM GOAL #4   Title Pt able to put on jacket/wash hair without L shoulder pain >3/10    Time 6    Period Weeks    Status New    Target Date 09/14/20                 Plan - 08/09/20 1005    Clinical Impression Statement Pt did well completing today's interventions. Postural cues needed with standing rows. Cues to keep elbows extended with shoulder extensions. She did well today's with resisted LUE external rotation. Pain at end range of PROM. Some weakness present with supine Therex    Examination-Activity Limitations Reach Overhead;Lift    Examination-Participation Restrictions Community Activity;Interpersonal Relationship    Stability/Clinical Decision Making  Stable/Uncomplicated    Rehab Potential Good    PT Frequency 2x / week  PT Duration 6 weeks    PT Treatment/Interventions ADLs/Self Care Home Management;Cryotherapy;Electrical Stimulation;Iontophoresis 4mg /ml Dexamethasone;Moist Heat;Ultrasound;Therapeutic exercise;Neuromuscular re-education;Patient/family education;Manual techniques;Vasopneumatic Device;Therapeutic activities    PT Next Visit Plan L shoulder strengthening and ROM ex's, manual/modalities as indicated, scap stab           Patient will benefit from skilled therapeutic intervention in order to improve the following deficits and impairments:  Decreased range of motion,Increased muscle spasms,Impaired UE functional use,Decreased endurance,Decreased activity tolerance,Pain,Improper body mechanics,Postural dysfunction,Decreased strength,Decreased scar mobility,Impaired flexibility,Increased edema  Visit Diagnosis: Acute pain of left shoulder  Localized edema  Stiffness of left shoulder, not elsewhere classified  Cramp and spasm     Problem List Patient Active Problem List   Diagnosis Date Noted  . Closed fracture of right proximal humerus 04/03/2020  . Groin hematoma 12/24/2019  . Angina pectoris ([place]) 12/23/2019  . Long-term current use of opiate analgesic 07/09/2017  . Chronic pain syndrome 07/09/2017  . Lumbar spondylosis 07/09/2017  . 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 Memory disorder 10/30/2015  . 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 Dyspnea on exertion 09/20/2015  . Unstable angina ([place]) 09/20/2015  . URI, acute 09/20/2015  . Obesity, morbid, BMI 40.0-49.9 ([place]) 06/02/2014  . Hypertension   . Hyperlipidemia   . Diabetes ([place]) 12/02/2013  . Depression 05/27/2013  . Osteoporosis 05/02/2013  . GERD (gastroesophageal reflux disease) 05/25/2012  . Hypothyroidism 05/25/2012  . Restless legs syndrome 05/25/2012  . Vitamin D deficiency 05/25/2012    Scot Jun 08/09/2020, 10:09 AM  [place]. [place], [place], [address] Phone: [phone]   Fax:  [phone]  Name: [name] MRN: [id number] Date of Birth: [birth year]

## 2020-08-13 ENCOUNTER — Other Ambulatory Visit: Payer: Self-pay

## 2020-08-13 ENCOUNTER — Encounter: Payer: Self-pay | Admitting: Physical Therapy

## 2020-08-13 ENCOUNTER — Ambulatory Visit: Payer: PPO | Admitting: Physical Therapy

## 2020-08-13 DIAGNOSIS — R600 Localized edema: Secondary | ICD-10-CM | POA: Diagnosis not present

## 2020-08-13 DIAGNOSIS — M25512 Pain in left shoulder: Secondary | ICD-10-CM

## 2020-08-13 DIAGNOSIS — R252 Cramp and spasm: Secondary | ICD-10-CM

## 2020-08-13 DIAGNOSIS — M25612 Stiffness of left shoulder, not elsewhere classified: Secondary | ICD-10-CM

## 2020-08-13 NOTE — Therapy (Signed)
[place]. [place], [place], [address] Phone: [phone]   Fax:  [phone]  Physical Therapy Treatment  Patient Details  Name: [name] MRN: [id number] Date of Birth: 01-Oct-1950 Referring Provider (PT): Victorino December   Encounter Date: 08/13/2020   PT End of Session - 08/13/20 1019    Visit Number 4    Date for PT Re-Evaluation 10/01/20    Authorization Type [employer]    PT Start Time 0930    PT Stop Time 1015    PT Time Calculation (min) 45 min    Activity Tolerance Patient tolerated treatment well    Behavior During Therapy [facility] for tasks assessed/performed           Past Medical History:  Diagnosis Date  . 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 Chronic pain syndrome 07/09/2017  . Coronary artery disease   . Depression   . Diabetes ([place]) 12/02/2013   no current meds   . Dyspnea on exertion 09/20/2015  . Gastro-esophageal reflux   . GERD (gastroesophageal reflux disease) 05/25/2012  . Hyperlipidemia   . Hypertension   . Hypothyroidism 05/25/2012   Last Assessment & Plan:  States was started after a partial thyroidectomy but does not recall any abnormal lab value and no symptoms.  Discussed as is on low dose, would be reasonable to discontinue and recheck in 6-8 weeks which she would like to do. Last Assessment & Plan:  States was started after a partial thyroidectomy but does not recall any abnormal lab value and no symptoms.  Discussed as   . Lumbar spondylosis 07/09/2017  . Memory disorder 10/30/2015  . Obesity, morbid, BMI 40.0-49.9 ([place]) 06/02/2014  . Osteoporosis 05/02/2013   Overview:  DEXA 11/14 DEXA 11/14  . Restless legs syndrome 05/25/2012   Last Assessment & Plan:  patient reports using clonazepam about once weekly .  Discussed nonpharmacologic tips for treating RLS.  States other medicatiosn she had treid had not been effective (does not  remember name) Will refill for now.  Advised if use increases, will readdress in future  . RLS (restless legs syndrome)   . Thyroid disease   . 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 URI, acute 09/20/2015  . Vitamin B 12 deficiency   . Vitamin D deficiency 05/25/2012    Past Surgical History:  Procedure Laterality Date  . ABDOMINAL HYSTERECTOMY    . APPENDECTOMY    . CATARACT EXTRACTION    . CHOLECYSTECTOMY    . CORONARY STENT INTERVENTION N/A 12/23/2019   Procedure: CORONARY STENT INTERVENTION;  Surgeon: Belva Crome, MD;  Location: [place] [facility];  Service: Cardiovascular;  Laterality: N/A;  . JOINT REPLACEMENT     2 total knee replacement  . LASIK    . LEFT HEART CATH AND CORONARY ANGIOGRAPHY N/A 12/23/2019   Procedure: LEFT HEART CATH AND CORONARY ANGIOGRAPHY;  Surgeon: Belva Crome, MD;  Location: [place] [facility];  Service: Cardiovascular;  Laterality: N/A;  . REVERSE SHOULDER ARTHROPLASTY Right 04/03/2020   Procedure: REVERSE SHOULDER ARTHROPLASTY;  Surgeon: Nicholes Stairs, MD;  [facility];  Service: Orthopedics;  Laterality: Right;  2.5 hrs  . THYROIDECTOMY    . TONSILLECTOMY      There were no vitals filed for this visit.   Subjective Assessment - 08/13/20 0929    Subjective Doing  ok, L shoulder hurting    Currently in Pain? Yes    Pain Score 4     Pain Location Shoulder    Pain Orientation Left              [facility] PT Assessment - 08/13/20 0001      AROM   Left Shoulder Flexion 155 Degrees    Left Shoulder ABduction 135 Degrees    Left Shoulder Internal Rotation 51 Degrees    Left Shoulder External Rotation 82 Degrees                         [facility] Adult PT Treatment/Exercise - 08/13/20 0001      Shoulder Exercises: Supine   Flexion Left;Strengthening;20 reps;Weights    Shoulder Flexion Weight (lbs) 2    Other Supine Exercises LUE ER/IR 2lb x 10      Shoulder Exercises: Standing    Flexion Weights;20 reps;Both;Strengthening    Shoulder Flexion Weight (lbs) 2    ABduction 20 reps;Strengthening;Weights;Right    Shoulder ABduction Weight (lbs) 2    Other Standing Exercises AAROM 3lb flex, IR up back, Ext x10      Shoulder Exercises: ROM/Strengthening   UBE (Upper Arm Bike) L1 x3 each    Other ROM/Strengthening Exercises Rows 20lb 2x10      Manual Therapy   Manual Therapy Passive ROM    Manual therapy comments LUE ER/IR                    PT Short Term Goals - 08/13/20 1022      PT SHORT TERM GOAL #1   Title independent with initial HEP    Status Achieved             PT Long Term Goals - 08/13/20 1022      PT LONG TERM GOAL #1   Title understand posture and body mechanics    Status On-going      PT LONG TERM GOAL #2   Title decrease L shoulder pain overall 50%                 Plan - 08/13/20 1019    Clinical Impression Statement Pt has progressed increasing her LUE AROM in all directions. Initial lat pull downs were difficulty but after cues to pull towards herself more she did well. no report of pain with the exercises. Come shoulder elevation noted with flexion and abduction. Some pain at end range of PROM.    Examination-Activity Limitations Reach Overhead;Lift    Examination-Participation Restrictions Community Activity;Interpersonal Relationship    Stability/Clinical Decision Making Stable/Uncomplicated    Rehab Potential Good    PT Frequency 2x / week    PT Duration 6 weeks    PT Treatment/Interventions ADLs/Self Care Home Management;Cryotherapy;Electrical Stimulation;Iontophoresis 4mg /ml Dexamethasone;Moist Heat;Ultrasound;Therapeutic exercise;Neuromuscular re-education;Patient/family education;Manual techniques;Vasopneumatic Device;Therapeutic activities    PT Next Visit Plan L shoulder strengthening and ROM ex's, manual/modalities as indicated, scap stab           Patient will benefit from skilled therapeutic  intervention in order to improve the following deficits and impairments:  Decreased range of motion,Increased muscle spasms,Impaired UE functional use,Decreased endurance,Decreased activity tolerance,Pain,Improper body mechanics,Postural dysfunction,Decreased strength,Decreased scar mobility,Impaired flexibility,Increased edema  Visit Diagnosis: Stiffness of left shoulder, not elsewhere classified  Localized edema  Acute pain of left shoulder  Cramp and spasm     Problem List Patient Active Problem List   Diagnosis Date Noted  .  Closed fracture of right proximal humerus 04/03/2020  . Groin hematoma 12/24/2019  . Angina pectoris ([place]) 12/23/2019  . Long-term current use of opiate analgesic 07/09/2017  . Chronic pain syndrome 07/09/2017  . Lumbar spondylosis 07/09/2017  . 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 Memory disorder 10/30/2015  . 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 Dyspnea on exertion 09/20/2015  . Unstable angina ([place]) 09/20/2015  . URI, acute 09/20/2015  . Obesity, morbid, BMI 40.0-49.9 ([place]) 06/02/2014  . Hypertension   . Hyperlipidemia   . Diabetes ([place]) 12/02/2013  . Depression 05/27/2013  . Osteoporosis 05/02/2013  . GERD (gastroesophageal reflux disease) 05/25/2012  . Hypothyroidism 05/25/2012  . Restless legs syndrome 05/25/2012  . Vitamin D deficiency 05/25/2012    Scot Jun, PTA 08/13/2020, 10:22 AM  [place]. [place], [place], [address] Phone: [phone]   Fax:  [phone]  Name: [name] MRN: [id number] Date of Birth: Apr 05, 1951

## 2020-08-15 ENCOUNTER — Ambulatory Visit: Payer: PPO | Admitting: Physical Therapy

## 2020-08-15 ENCOUNTER — Encounter: Payer: Self-pay | Admitting: Physical Therapy

## 2020-08-15 ENCOUNTER — Other Ambulatory Visit: Payer: Self-pay

## 2020-08-15 DIAGNOSIS — R600 Localized edema: Secondary | ICD-10-CM

## 2020-08-15 DIAGNOSIS — M25512 Pain in left shoulder: Secondary | ICD-10-CM

## 2020-08-15 DIAGNOSIS — M25612 Stiffness of left shoulder, not elsewhere classified: Secondary | ICD-10-CM

## 2020-08-15 NOTE — Therapy (Signed)
[place]. [place], [place], [address] Phone: [phone]   Fax:  [phone]  Physical Therapy Treatment  Patient Details  Name: [name] MRN: [id number] Date of Birth: [birth year] Referring Provider (PT): Victorino December   Encounter Date: 08/15/2020   PT End of Session - 08/15/20 1006    Visit Number 5    Date for PT Re-Evaluation 10/01/20    Authorization Type [employer]    PT Start Time 0927    PT Stop Time 1010    PT Time Calculation (min) 43 min    Activity Tolerance Patient tolerated treatment well    Behavior During Therapy [facility] for tasks assessed/performed           Past Medical History:  Diagnosis Date  . 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 Chronic pain syndrome 07/09/2017  . Coronary artery disease   . Depression   . Diabetes ([place]) 12/02/2013   no current meds   . Dyspnea on exertion 09/20/2015  . Gastro-esophageal reflux   . GERD (gastroesophageal reflux disease) 05/25/2012  . Hyperlipidemia   . Hypertension   . Hypothyroidism 05/25/2012   Last Assessment & Plan:  States was started after a partial thyroidectomy but does not recall any abnormal lab value and no symptoms.  Discussed as is on low dose, would be reasonable to discontinue and recheck in 6-8 weeks which she would like to do. Last Assessment & Plan:  States was started after a partial thyroidectomy but does not recall any abnormal lab value and no symptoms.  Discussed as   . Lumbar spondylosis 07/09/2017  . Memory disorder 10/30/2015  . Obesity, morbid, BMI 40.0-49.9 ([place]) 06/02/2014  . Osteoporosis 05/02/2013   Overview:  DEXA 11/14 DEXA 11/14  . Restless legs syndrome 05/25/2012   Last Assessment & Plan:  patient reports using clonazepam about once weekly .  Discussed nonpharmacologic tips for treating RLS.  States other medicatiosn she had treid had not been effective (does not  remember name) Will refill for now.  Advised if use increases, will readdress in future  . RLS (restless legs syndrome)   . Thyroid disease   . 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 URI, acute 09/20/2015  . Vitamin B 12 deficiency   . Vitamin D deficiency 05/25/2012    Past Surgical History:  Procedure Laterality Date  . ABDOMINAL HYSTERECTOMY    . APPENDECTOMY    . CATARACT EXTRACTION    . CHOLECYSTECTOMY    . CORONARY STENT INTERVENTION N/A 12/23/2019   Procedure: CORONARY STENT INTERVENTION;  Surgeon: Belva Crome, MD;  Location: [place] [facility];  Service: Cardiovascular;  Laterality: N/A;  . JOINT REPLACEMENT     2 total knee replacement  . LASIK    . LEFT HEART CATH AND CORONARY ANGIOGRAPHY N/A 12/23/2019   Procedure: LEFT HEART CATH AND CORONARY ANGIOGRAPHY;  Surgeon: Belva Crome, MD;  Location: [place] [facility];  Service: Cardiovascular;  Laterality: N/A;  . REVERSE SHOULDER ARTHROPLASTY Right 04/03/2020   Procedure: REVERSE SHOULDER ARTHROPLASTY;  Surgeon: Nicholes Stairs, MD;  [facility];  Service: Orthopedics;  Laterality: Right;  2.5 hrs  . THYROIDECTOMY    . TONSILLECTOMY      There were no vitals filed for this visit.   Subjective Assessment - 08/15/20 0929    Subjective "[place],  muscles are sore I feel like I been in a fight"    Currently in Pain? Yes    Pain Score 4     Pain Location Arm    Pain Orientation Left                             [facility] Adult PT Treatment/Exercise - 08/15/20 0001      Shoulder Exercises: Standing   External Rotation Strengthening;Left;20 reps;Theraband    Theraband Level (Shoulder External Rotation) Level 2 (Red)    Internal Rotation Strengthening;Left;20 reps;Theraband    Theraband Level (Shoulder Internal Rotation) Level 2 (Red)    Flexion Weights;20 reps;Both;Strengthening    Shoulder Flexion Weight (lbs) 3    Shoulder ABduction Weight (lbs) 1    Extension  Strengthening;Both;20 reps;Theraband    Theraband Level (Shoulder Extension) Level 4 (Blue)    Row Theraband;20 reps;Both;Strengthening    Theraband Level (Shoulder Row) Level 4 (Blue)    Other Standing Exercises Triceps Ext 15lb 3x10    Other Standing Exercises 2 leve cabinet reaches flex & abd x10      Shoulder Exercises: ROM/Strengthening   UBE (Upper Arm Bike) L1 x3 each                    PT Short Term Goals - 08/13/20 1022      PT SHORT TERM GOAL #1   Title independent with initial HEP    Status Achieved             PT Long Term Goals - 08/13/20 1022      PT LONG TERM GOAL #1   Title understand posture and body mechanics    Status On-going      PT LONG TERM GOAL #2   Title decrease L shoulder pain overall 50%                 Plan - 08/15/20 1007    Clinical Impression Statement Pt enters clinic reporting increase lateral L shoulder pain. Pt did require breaks with aerobic warm up due to L shoulder pain. Pt reports it could be the weather or she slept on it wrong. Increase resistance tolerated with rows, extensions, external, and internal rotation. Some difficulty with 3 level cabinet reaches.    Examination-Activity Limitations Reach Overhead;Lift    Examination-Participation Restrictions Community Activity;Interpersonal Relationship    Stability/Clinical Decision Making Stable/Uncomplicated    Rehab Potential Good    PT Frequency 2x / week    PT Treatment/Interventions ADLs/Self Care Home Management;Cryotherapy;Electrical Stimulation;Iontophoresis 4mg /ml Dexamethasone;Moist Heat;Ultrasound;Therapeutic exercise;Neuromuscular re-education;Patient/family education;Manual techniques;Vasopneumatic Device;Therapeutic activities    PT Next Visit Plan L shoulder strengthening and ROM ex's, manual/modalities as indicated, scap stab           Patient will benefit from skilled therapeutic intervention in order to improve the following deficits and  impairments:  Decreased range of motion,Increased muscle spasms,Impaired UE functional use,Decreased endurance,Decreased activity tolerance,Pain,Improper body mechanics,Postural dysfunction,Decreased strength,Decreased scar mobility,Impaired flexibility,Increased edema  Visit Diagnosis: Localized edema  Stiffness of left shoulder, not elsewhere classified  Acute pain of left shoulder     Problem List Patient Active Problem List   Diagnosis Date Noted  . Closed fracture of right proximal humerus 04/03/2020  . Groin hematoma 12/24/2019  . Angina pectoris ([place]) 12/23/2019  . Long-term current use of opiate analgesic 07/09/2017  . Chronic pain syndrome 07/09/2017  . Lumbar spondylosis 07/09/2017  . 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  Memory disorder 10/30/2015  . 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 Dyspnea on exertion 09/20/2015  . Unstable angina ([place]) 09/20/2015  . URI, acute 09/20/2015  . Obesity, morbid, BMI 40.0-49.9 ([place]) 06/02/2014  . Hypertension   . Hyperlipidemia   . Diabetes ([place]) 12/02/2013  . Depression 05/27/2013  . Osteoporosis 05/02/2013  . GERD (gastroesophageal reflux disease) 05/25/2012  . Hypothyroidism 05/25/2012  . Restless legs syndrome 05/25/2012  . Vitamin D deficiency 05/25/2012    Scot Jun 08/15/2020, 10:15 AM  [place]. [place], [place], [address] Phone: [phone]   Fax:  [phone]  Name: [name] MRN: [id number] Date of Birth: March 18, 1951

## 2020-08-16 DIAGNOSIS — M5416 Radiculopathy, lumbar region: Secondary | ICD-10-CM | POA: Diagnosis not present

## 2020-08-16 NOTE — Progress Notes (Signed)
Cardiology Office Note:    Date:  08/17/2020   ID:  [name], DOB 1951/02/06, MRN [id number]  PCP:  [name], DO  Cardiologist:  Sinclair Grooms, MD   Referring MD: [name], DO   Chief Complaint  Patient presents with  . Coronary Artery Disease    History of Present Illness:    [name] is a 70 y.o. female with a hx of hypertension, obesity, hyperlipidemia, DM II, with recent unstable angina treated with DE stent circumflex with good result.  Intolerant of Brilinta due to dyspnea.  Interventional procedure complicated by small radial artery requiring switch over to femoral approach which was complicated by late femoral bleed.  Overall she is doing okay.  She has had a right total shoulder replacement and now has a torn left rotator cuff.  She is relatively inactive.  She is in physical therapy for her shoulders.  She has not had angina.  She is compliant with her current medical regimen.  She complains of easy bruising.  She is on Plavix monotherapy.  Aspirin and Plavix was causing unacceptable bleeding.  Past Medical History:  Diagnosis Date  . 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 Chronic pain syndrome 07/09/2017  . Coronary artery disease   . Depression   . Diabetes ([place]) 12/02/2013   no current meds   . Dyspnea on exertion 09/20/2015  . Gastro-esophageal reflux   . GERD (gastroesophageal reflux disease) 05/25/2012  . Hyperlipidemia   . Hypertension   . Hypothyroidism 05/25/2012   Last Assessment & Plan:  States was started after a partial thyroidectomy but does not recall any abnormal lab value and no symptoms.  Discussed as is on low dose, would be reasonable to discontinue and recheck in 6-8 weeks which she would like to do. Last Assessment & Plan:  States was started after a partial thyroidectomy but does not recall any abnormal lab value and no symptoms.  Discussed as   . Lumbar spondylosis 07/09/2017  .  Memory disorder 10/30/2015  . Obesity, morbid, BMI 40.0-49.9 ([place]) 06/02/2014  . Osteoporosis 05/02/2013   Overview:  DEXA 11/14 DEXA 11/14  . Restless legs syndrome 05/25/2012   Last Assessment & Plan:  patient reports using clonazepam about once weekly .  Discussed nonpharmacologic tips for treating RLS.  States other medicatiosn she had treid had not been effective (does not remember name) Will refill for now.  Advised if use increases, will readdress in future  . RLS (restless legs syndrome)   . Thyroid disease   . 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 URI, acute 09/20/2015  . Vitamin B 12 deficiency   . Vitamin D deficiency 05/25/2012    Past Surgical History:  Procedure Laterality Date  . ABDOMINAL HYSTERECTOMY    . APPENDECTOMY    . CATARACT EXTRACTION    . CHOLECYSTECTOMY    . CORONARY STENT INTERVENTION N/A 12/23/2019   Procedure: CORONARY STENT INTERVENTION;  Surgeon: Belva Crome, MD;  Location: [facility];  Service: Cardiovascular;  Laterality: N/A;  . JOINT REPLACEMENT     2 total knee replacement  . LASIK    . LEFT HEART CATH AND CORONARY ANGIOGRAPHY N/A 12/23/2019   Procedure: LEFT HEART CATH AND CORONARY ANGIOGRAPHY;  Surgeon: Belva Crome, MD;  Location: [facility];  Service: Cardiovascular;  Laterality: N/A;  . REVERSE  SHOULDER ARTHROPLASTY Right 04/03/2020   Procedure: REVERSE SHOULDER ARTHROPLASTY;  Surgeon: Nicholes Stairs, MD;  Location: [facility];  Service: Orthopedics;  Laterality: Right;  2.5 hrs  . THYROIDECTOMY    . TONSILLECTOMY      Current Medications: Current Meds  Medication Sig  . acetaminophen (TYLENOL) 500 MG tablet Take 1 tablet (500 mg total) by mouth every 6 (six) hours.  Marland Kitchen atorvastatin (LIPITOR) 80 MG tablet Take 1 tablet (80 mg total) by mouth daily.  . clonazePAM (KLONOPIN) 1 MG tablet Take 1 mg by mouth at bedtime as needed (restless leg).   . clopidogrel (PLAVIX) 75 MG tablet Take 1  tablet (75 mg total) by mouth daily.  Marland Kitchen docusate sodium (COLACE) 100 MG capsule Take 1 capsule (100 mg total) by mouth 2 (two) times daily.  Marland Kitchen losartan (COZAAR) 50 MG tablet Take 1 tablet (50 mg total) by mouth daily.  . metoprolol succinate (TOPROL-XL) 25 MG 24 hr tablet Take 1 tablet (25 mg total) by mouth daily.  . ondansetron (ZOFRAN ODT) 8 MG disintegrating tablet Take 1 tablet (8 mg total) by mouth every 8 (eight) hours as needed for nausea or vomiting.  Marland Kitchen oxyCODONE-acetaminophen (PERCOCET) 10-325 MG tablet Take 1 tablet by mouth every 4 (four) hours as needed for up to 30 doses for pain (Take one tablet every 4 to 6 hours as needed for moderate-severe pain).  . pantoprazole (PROTONIX) 40 MG tablet Take 1 tablet (40 mg total) by mouth daily.  . Vitamin D, Ergocalciferol, (DRISDOL) 1.25 MG (50000 UNIT) CAPS capsule Take 50,000 Units by mouth once a week.      Allergies:   Patient has no active allergies.   Social History   Socioeconomic History  . Marital status: Divorced    Spouse name: Not on file  . Number of children: Not on file  . Years of education: Not on file  . Highest education level: Not on file  Occupational History  . Not on file  Tobacco Use  . Smoking status: Former Smoker    Types: Cigarettes    Quit date: 02/17/1987    Years since quitting: 33.5  . Smokeless tobacco: Never Used  Vaping Use  . Vaping Use: Never used  Substance and Sexual Activity  . Alcohol use: No  . Drug use: No  . Sexual activity: Not Currently  Other Topics Concern  . Not on file  Social History Narrative   Lives at home alone   Right-handed   Social Determinants of Health   Financial Resource Strain: Not on file  Food Insecurity: Not on file  Transportation Needs: Not on file  Physical Activity: Not on file  Stress: Not on file  Social Connections: Not on file     Family History: The patient's family history includes Cancer in her father and mother; Diabetes in her brother,  brother, and sister; Heart disease in her brother; Hypertension in her mother. There is no history of Breast cancer.  ROS:   Please see the history of present illness.    Bilateral shoulder discomfort.  Most recently, has a torn left rotator cuff.  All other systems reviewed and are negative.  EKGs/Labs/Other Studies Reviewed:    The following studies were reviewed today:  Coronary angiography/PCI December 23, 2019: Diagnostic Dominance: Right    Intervention       EKG:  EKG no new data  Recent Labs: 02/03/2020: ALT 14 04/30/2020: BUN 13; Creatinine, Ser 0.62; Potassium 4.0; Sodium 143 07/24/2020:  Hemoglobin 13.4; Platelets 219  Recent Lipid Panel    Component Value Date/Time   CHOL 133 02/03/2020 0935   TRIG 75 02/03/2020 0935   HDL 67 02/03/2020 0935   CHOLHDL 2.0 02/03/2020 0935   LDLCALC 51 02/03/2020 0935    Physical Exam:    VS:  BP 128/86   Pulse 81   Ht 5\' 1"  (1.549 m)   Wt 264 lb 3.2 oz (119.8 kg)   SpO2 99%   BMI 49.92 kg/m     Wt Readings from Last 3 Encounters:  08/17/20 264 lb 3.2 oz (119.8 kg)  04/30/20 247 lb (112 kg)  04/03/20 252 lb (114.3 kg)     GEN: Morbid obesity. No acute distress HEENT: Normal NECK: No JVD. LYMPHATICS: No lymphadenopathy CARDIAC: No murmur. RRR no gallop, or edema. VASCULAR:  Normal Pulses. No bruits. RESPIRATORY:  Clear to auscultation without rales, wheezing or rhonchi  ABDOMEN: Soft, non-tender, non-distended, No pulsatile mass, MUSCULOSKELETAL: No deformity  SKIN: Warm and dry NEUROLOGIC:  Alert and oriented x 3 PSYCHIATRIC:  Normal affect   ASSESSMENT:    1. Coronary artery disease involving native coronary artery of native heart with unstable angina pectoris ([place])   2. Hyperlipidemia LDL goal <70   3. Essential hypertension   4. Obesity, morbid, BMI 40.0-49.9 ([place])   5. Controlled type 2 diabetes mellitus without complication, without long-term current use of insulin ([place])   6. Long-term current use of  opiate analgesic   7. Educated about COVID-19 virus infection    PLAN:    In order of problems listed above:  1. Secondary prevention reviewed.  She needs to work hard to increase minutes of aerobic activity beyond 150/week. 2. Continue statin therapy.  Lipid panel in 6 months. 3. Weight loss, exercise, and low-salt diet in addition to current therapy which includes Toprol-XL and Cozaar. 4. Decrease caloric intake 5. Decrease caloric carbohydrate intake. 6. Not discussed 7. Not discussed  Overall education and awareness concerning secondary risk prevention was discussed in detail: LDL less than 70, hemoglobin A1c less than 7, blood pressure target less than 130/80 mmHg, >150 minutes of moderate aerobic activity per week, avoidance of smoking, weight control (via diet and exercise), and continued surveillance/management of/for obstructive sleep apnea.    Medication Adjustments/Labs and Tests Ordered: Current medicines are reviewed at length with the patient today.  Concerns regarding medicines are outlined above.  No orders of the defined types were placed in this encounter.  No orders of the defined types were placed in this encounter.   There are no Patient Instructions on file for this visit.   Signed, Sinclair Grooms, MD  08/17/2020 11:29 AM   secondary prevention discussed [place]

## 2020-08-17 ENCOUNTER — Encounter: Payer: Self-pay | Admitting: Interventional Cardiology

## 2020-08-17 ENCOUNTER — Ambulatory Visit: Payer: PPO | Admitting: Interventional Cardiology

## 2020-08-17 ENCOUNTER — Other Ambulatory Visit: Payer: Self-pay

## 2020-08-17 VITALS — BP 128/86 | HR 81 | Ht 61.0 in | Wt 264.2 lb

## 2020-08-17 DIAGNOSIS — I25110 Atherosclerotic heart disease of native coronary artery with unstable angina pectoris: Secondary | ICD-10-CM | POA: Diagnosis not present

## 2020-08-17 DIAGNOSIS — Z79891 Long term (current) use of opiate analgesic: Secondary | ICD-10-CM | POA: Diagnosis not present

## 2020-08-17 DIAGNOSIS — I10 Essential (primary) hypertension: Secondary | ICD-10-CM | POA: Diagnosis not present

## 2020-08-17 DIAGNOSIS — E785 Hyperlipidemia, unspecified: Secondary | ICD-10-CM | POA: Diagnosis not present

## 2020-08-17 DIAGNOSIS — E119 Type 2 diabetes mellitus without complications: Secondary | ICD-10-CM

## 2020-08-17 DIAGNOSIS — Z7189 Other specified counseling: Secondary | ICD-10-CM

## 2020-08-17 NOTE — Patient Instructions (Signed)
Medication Instructions:  Your physician recommends that you continue on your current medications as directed. Please refer to the Current Medication list given to you today.  *If you need a refill on your cardiac medications before your next appointment, please call your pharmacy*   Lab Work: Lipid and Liver when you come back in 6 months.  You will need to be fasting for these labs (nothing to eat or drink after midnight except water and black coffee).  If you have labs (blood work) drawn today and your tests are completely normal, you will receive your results only by: Marland Kitchen MyChart Message (if you have MyChart) OR . A paper copy in the mail If you have any lab test that is abnormal or we need to change your treatment, we will call you to review the results.   Testing/Procedures: None   Follow-Up: At [facility], you and your health needs are our priority.  As part of our continuing mission to provide you with exceptional heart care, we have created designated Provider Care Teams.  These Care Teams include your primary Cardiologist (physician) and Advanced Practice Providers (APPs -  Physician Assistants and Nurse Practitioners) who all work together to provide you with the care you need, when you need it.  We recommend signing up for the patient portal called "MyChart".  Sign up information is provided on this After Visit Summary.  MyChart is used to connect with patients for Virtual Visits (Telemedicine).  Patients are able to view lab/test results, encounter notes, upcoming appointments, etc.  Non-urgent messages can be sent to your provider as well.   To learn more about what you can do with MyChart, go to [email or web address].    Your next appointment:   6 month(s)  The format for your next appointment:   In Person  Provider:   You may see Sinclair Grooms, MD or one of the following Advanced Practice Providers on your designated Care Team:    Kathyrn Drown,  NP    Other Instructions  Your provider recommends that you maintain 150 minutes per week of moderate aerobic activity.

## 2020-08-20 ENCOUNTER — Ambulatory Visit: Payer: PPO | Admitting: Physical Therapy

## 2020-08-22 ENCOUNTER — Ambulatory Visit: Payer: PPO | Admitting: Physical Therapy

## 2020-08-24 ENCOUNTER — Ambulatory Visit: Payer: PPO | Admitting: Family Medicine

## 2020-08-27 ENCOUNTER — Ambulatory Visit: Payer: PPO | Admitting: Physical Therapy

## 2020-09-07 DIAGNOSIS — M25511 Pain in right shoulder: Secondary | ICD-10-CM | POA: Diagnosis not present

## 2020-09-07 DIAGNOSIS — M75122 Complete rotator cuff tear or rupture of left shoulder, not specified as traumatic: Secondary | ICD-10-CM | POA: Diagnosis not present

## 2020-09-10 ENCOUNTER — Telehealth: Payer: Self-pay | Admitting: *Deleted

## 2020-09-10 NOTE — Telephone Encounter (Signed)
   Primary Cardiologist: Sinclair Grooms, MD  Chart reviewed as part of pre-operative protocol coverage. Patient has history of HTN, obesity, HLD, DM, recent unstbale angina s/p DES to Cx. Brilinta switched to Plavix due to dyspnea. ASA eventually stopped due to bruising while on DAPT. Patient will need call but given that PCI was <1 year ago, will first route to Dr. Tamala Julian for input on holding Plavix for arthroscopic shoulder surgery  - Dr. Tamala Julian - Please route response to [facility] PREOP (the pre-op pool). Thank you.   [name], PA-C 09/10/2020, 1:17 PM

## 2020-09-10 NOTE — Telephone Encounter (Signed)
   [facility] Pre-operative Risk Assessment    HEARTCARE STAFF: - Please ensure there is not already an duplicate clearance open for this procedure. - Under Visit Info/Reason for Call, type in Other and utilize the format Clearance MM/DD/YY or Clearance TBD. Do not use dashes or single digits. - If request is for dental extraction, please clarify the # of teeth to be extracted.  Request for surgical clearance:  1. What type of surgery is being performed? LEFT SHOULDER ARTHROSCOPIC RCR, EXTENSIVE DEBRIDEMENT, SAD   2. When is this surgery scheduled? TBD   3. What type of clearance is required (medical clearance vs. Pharmacy clearance to hold med vs. Both)? MEDICAL  4. Are there any medications that need to be held prior to surgery and how long? PLAVIX   5. Practice name and name of physician performing surgery? [facility]; DR. Corene Cornea [place]   6. What is the office phone number? [phone]   7.   What is the office fax number? [place]   8.   Anesthesia type (None, local, MAC, general) ? CHOICE   Julaine Hua 09/10/2020, 11:34 AM  _________________________________________________________________   (provider comments below)

## 2020-09-11 DIAGNOSIS — I10 Essential (primary) hypertension: Secondary | ICD-10-CM | POA: Diagnosis not present

## 2020-09-11 DIAGNOSIS — E039 Hypothyroidism, unspecified: Secondary | ICD-10-CM | POA: Diagnosis not present

## 2020-09-11 DIAGNOSIS — E785 Hyperlipidemia, unspecified: Secondary | ICD-10-CM | POA: Diagnosis not present

## 2020-09-11 DIAGNOSIS — E118 Type 2 diabetes mellitus with unspecified complications: Secondary | ICD-10-CM | POA: Diagnosis not present

## 2020-09-11 NOTE — Telephone Encounter (Signed)
Printed out the cardiac clearance and walked it over to [employer]

## 2020-09-11 NOTE — Telephone Encounter (Signed)
Surgery should be done in July as this will be 1 year out from stent. Is there a reason to do earlier and have increased risk of stent thrombosis?

## 2020-09-11 NOTE — Telephone Encounter (Signed)
Patient underwent cardiac catheterization with PCI and stent placement 12/23/2019.  Due to risk of in-stent thrombosis/stenosis patient will not be able to come off of her Plavix until that time.  Please contact requesting office and let them know that surgery will need to be rescheduled until after 12/22/20.

## 2020-09-14 DIAGNOSIS — E559 Vitamin D deficiency, unspecified: Secondary | ICD-10-CM | POA: Diagnosis not present

## 2020-09-14 DIAGNOSIS — Z79899 Other long term (current) drug therapy: Secondary | ICD-10-CM | POA: Diagnosis not present

## 2020-09-14 DIAGNOSIS — E119 Type 2 diabetes mellitus without complications: Secondary | ICD-10-CM | POA: Diagnosis not present

## 2020-09-14 DIAGNOSIS — Z0000 Encounter for general adult medical examination without abnormal findings: Secondary | ICD-10-CM | POA: Diagnosis not present

## 2020-09-14 DIAGNOSIS — G2581 Restless legs syndrome: Secondary | ICD-10-CM | POA: Diagnosis not present

## 2020-09-14 DIAGNOSIS — I10 Essential (primary) hypertension: Secondary | ICD-10-CM | POA: Diagnosis not present

## 2020-09-14 DIAGNOSIS — Z955 Presence of coronary angioplasty implant and graft: Secondary | ICD-10-CM | POA: Diagnosis not present

## 2020-09-14 DIAGNOSIS — Z6841 Body Mass Index (BMI) 40.0 and over, adult: Secondary | ICD-10-CM | POA: Diagnosis not present

## 2020-09-14 DIAGNOSIS — K219 Gastro-esophageal reflux disease without esophagitis: Secondary | ICD-10-CM | POA: Diagnosis not present

## 2020-09-14 DIAGNOSIS — M5136 Other intervertebral disc degeneration, lumbar region: Secondary | ICD-10-CM | POA: Diagnosis not present

## 2020-09-23 DIAGNOSIS — Z1212 Encounter for screening for malignant neoplasm of rectum: Secondary | ICD-10-CM | POA: Diagnosis not present

## 2020-09-23 DIAGNOSIS — Z1211 Encounter for screening for malignant neoplasm of colon: Secondary | ICD-10-CM | POA: Diagnosis not present

## 2020-09-25 DIAGNOSIS — L820 Inflamed seborrheic keratosis: Secondary | ICD-10-CM | POA: Diagnosis not present

## 2020-09-25 DIAGNOSIS — L603 Nail dystrophy: Secondary | ICD-10-CM | POA: Diagnosis not present

## 2020-09-25 DIAGNOSIS — Z85828 Personal history of other malignant neoplasm of skin: Secondary | ICD-10-CM | POA: Diagnosis not present

## 2020-09-25 DIAGNOSIS — B354 Tinea corporis: Secondary | ICD-10-CM | POA: Diagnosis not present

## 2020-09-25 DIAGNOSIS — L650 Telogen effluvium: Secondary | ICD-10-CM | POA: Diagnosis not present

## 2020-09-25 DIAGNOSIS — D1801 Hemangioma of skin and subcutaneous tissue: Secondary | ICD-10-CM | POA: Diagnosis not present

## 2020-09-25 DIAGNOSIS — L821 Other seborrheic keratosis: Secondary | ICD-10-CM | POA: Diagnosis not present

## 2020-09-25 DIAGNOSIS — L905 Scar conditions and fibrosis of skin: Secondary | ICD-10-CM | POA: Diagnosis not present

## 2020-09-28 DIAGNOSIS — M7542 Impingement syndrome of left shoulder: Secondary | ICD-10-CM | POA: Diagnosis not present

## 2020-09-28 LAB — EXTERNAL GENERIC LAB PROCEDURE: COLOGUARD: NEGATIVE

## 2020-09-28 LAB — COLOGUARD: COLOGUARD: NEGATIVE

## 2020-10-01 ENCOUNTER — Telehealth: Payer: Self-pay | Admitting: Interventional Cardiology

## 2020-10-01 NOTE — Telephone Encounter (Signed)
   [facility] Pre-operative Risk Assessment    Request for surgical clearance:  1. What type of surgery is being performed? Left shoulder arthroscopy with rotator cuff repair   2. When is this surgery scheduled? TBD  3. What type of clearance is required (medical clearance vs. Pharmacy clearance to hold med vs. Both)?  Medical  4.   5. Are there any medications that need to be held prior to surgery and how long? Per Dr. Stann Mainland pt does not need to stop Plavix  6. Practice name and name of physician performing surgery? [facility] Dr. Stann Mainland  7. What is your office phone number [phone]   7.   What is your office fax number [phone]  8.   Anesthesia type (None, local, MAC, general) ? Choice Per Venida Jarvis, pt does not need to stop Plavix  Larina Bras 10/01/2020, 2:21 PM  _________________________________________________________________   (provider comments below)

## 2020-10-01 NOTE — Telephone Encounter (Signed)
   Patient Name: [name]  DOB: 01/10/1951  MRN: [id number]   Primary Cardiologist: Sinclair Grooms, MD  Chart reviewed as part of pre-operative protocol coverage. Patient was contacted 10/01/2020 in reference to pre-operative risk assessment for pending surgery as outlined below.  KAZIAH KRIZEK was last seen on 08/17/20 by Dr. Tamala Julian.  Since that day, SHADOW STIGGERS has done well from a cardiac perspective, denies anginal symtpoms. Per Dr. Dennie Maizes note, we do not need to hold Plavix prior to the procedure.   Therefore, based on ACC/AHA guidelines, the patient would be at acceptable risk for the planned procedure without further cardiovascular testing.   The patient was advised that if she develops new symptoms prior to surgery to contact our office to arrange for a follow-up visit, and she verbalized understanding.  I will route this recommendation to the requesting party via Epic fax function and remove from pre-op pool. Please call with questions.  Reise Hietala Ninfa Meeker, PA-C 10/01/2020, 3:13 PM

## 2020-10-19 ENCOUNTER — Encounter (HOSPITAL_COMMUNITY): Payer: Self-pay

## 2020-10-19 NOTE — Progress Notes (Incomplete)
PCP - *** Cardiologist - ***  Chest x-ray - *** EKG - *** Stress Test - *** ECHO - *** Cardiac Cath - 12/23/19  Sleep Study -  no CPAP - ***  Fasting Blood Sugar - *** Checks Blood Sugar *** times a day  . If your blood sugar is less than 70 mg/dL, you will need to treat for low blood sugar: o Treat a low blood sugar (less than 70 mg/dL) with  cup of clear juice (cranberry or apple), 4 glucose tablets, OR glucose gel. o Recheck blood sugar in 15 minutes after treatment (to make sure it is greater than 70 mg/dL). If your blood sugar is not greater than 70 mg/dL on recheck, call [phone] for further instructions.   Blood Thinner Instructions:  Follow your surgeon's plavix instructions for your upcoming surgery.Marland Kitchen  ERAS: Clear liquids til 9:30 am on DOS.  Anesthesia review: ***  STOP now taking any Aspirin (unless otherwise instructed by your surgeon), Aleve, Naproxen, Ibuprofen, Motrin, Advil, Goody's, BC's, all herbal medications, fish oil, and all vitamins.   Coronavirus Screening Covid test at PAT appt.  Do you have any of the following symptoms:  Cough {yes/no:20286:::1} Fever (>100.20F)  {yes/no:20286:::1} Runny nose {yes/no:20286:::1} Sore throat {yes/no:20286:::1} Difficulty breathing/shortness of breath  {yes/no:20286:::1}  Have you traveled in the last 14 days and where? {yes/no:20286:::1}  Patient verbalized understanding of instructions that were given to them at the PAT appointment.

## 2020-10-19 NOTE — Progress Notes (Signed)
Surgical Instructions    Your procedure is scheduled on 10/25/20.  Report to [facility] Main Entrance "A" at 10:30 A.M., then check in with the Admitting office.  Call this number if you have problems the morning of surgery:  [phone]   If you have any questions prior to your surgery date call [phone]: Open Monday-Friday 8am-4pm    Remember:  Do not eat after midnight the night before your surgery  You may drink clear liquids until 09:30am the morning of your surgery.   Clear liquids allowed are: Water, Non-Citrus Juices (without pulp), Carbonated Beverages, Clear Tea, Black Coffee Only, and Gatorade  Patient Instructions  . The night before surgery:  o No food after midnight. ONLY clear liquids after midnight   . The day of surgery (if you have diabetes): o Drink ONE (1) 10 oz water bottle given to you in your pre admission testing appointment by 09:30am the morning of surgery. Drink in one sitting. Do not sip.  o This drink was given to you during your hospital  pre-op appointment visit.  o Nothing else to drink after completing the  10 oz bottle of water.          If you have questions, please contact your surgeon's office.     Take these medicines the morning of surgery with A SIP OF WATER  atorvastatin (LIPITOR)  clonazePAM (KLONOPIN) as needed clopidogrel (PLAVIX) metoprolol succinate (TOPROL-XL) pantoprazole (PROTONIX)  As of today, STOP taking any Aspirin (unless otherwise instructed by your surgeon) Aleve, Naproxen, Ibuprofen, Motrin, Advil, Goody's, BC's, all herbal medications, fish oil, and all vitamins. Per cardiology and Dr. Stann Mainland, you do NOT need to hold clopidogrel (PLAVIX).   WHAT DO I DO ABOUT MY DIABETES MEDICATION?   Marland Kitchen Do not take oral diabetes medicines (pills) the morning of surgery.   . The day of surgery, do not take other diabetes injectables, including Byetta (exenatide), Bydureon (exenatide ER), Victoza (liraglutide), or Trulicity  (dulaglutide).  . If your CBG is greater than 220 mg/dL, you may take  of your sliding scale (correction) dose of insulin.   HOW TO MANAGE YOUR DIABETES BEFORE AND AFTER SURGERY  Why is it important to control my blood sugar before and after surgery? . Improving blood sugar levels before and after surgery helps healing and can limit problems. . A way of improving blood sugar control is eating a healthy diet by: o  Eating less sugar and carbohydrates o  Increasing activity/exercise o  Talking with your doctor about reaching your blood sugar goals . High blood sugars (greater than 180 mg/dL) can raise your risk of infections and slow your recovery, so you will need to focus on controlling your diabetes during the weeks before surgery. . Make sure that the doctor who takes care of your diabetes knows about your planned surgery including the date and location.  How do I manage my blood sugar before surgery? . Check your blood sugar at least 4 times a day, starting 2 days before surgery, to make sure that the level is not too high or low. . Check your blood sugar the morning of your surgery when you wake up and every 2 hours until you get to the Short Stay unit. o If your blood sugar is less than 70 mg/dL, you will need to treat for low blood sugar: - Do not take insulin. - Treat a low blood sugar (less than 70 mg/dL) with  cup of clear juice (cranberry or apple),  4 glucose tablets, OR glucose gel. - Recheck blood sugar in 15 minutes after treatment (to make sure it is greater than 70 mg/dL). If your blood sugar is not greater than 70 mg/dL on recheck, call [phone] for further instructions. . Report your blood sugar to the short stay nurse when you get to Short Stay.  . If you are admitted to the hospital after surgery: o Your blood sugar will be checked by the staff and you will probably be given insulin after surgery (instead of oral diabetes medicines) to make sure you have good blood  sugar levels. o The goal for blood sugar control after surgery is 80-180 mg/dL.                      Do not wear jewelry, make up, or nail polish            Do not wear lotions, powders, perfumes/colognes, or deodorant.            Do not shave 48 hours prior to surgery.              Do not bring valuables to the hospital.            [facility] is not responsible for any belongings or valuables.  Do NOT Smoke (Tobacco/Vaping) or drink Alcohol 24 hours prior to your procedure If you use a CPAP at night, you may bring all equipment for your overnight stay.   Contacts, glasses, dentures or bridgework may not be worn into surgery, please bring cases for these belongings   For patients admitted to the hospital, discharge time will be determined by your treatment team.   Patients discharged the day of surgery will not be allowed to drive home, and someone needs to stay with them for 24 hours.    Special instructions:   [facility]- Preparing For Surgery  Before surgery, you can play an important role. Because skin is not sterile, your skin needs to be as free of germs as possible. You can reduce the number of germs on your skin by washing with CHG (chlorahexidine gluconate) Soap before surgery.  CHG is an antiseptic cleaner which kills germs and bonds with the skin to continue killing germs even after washing.    Oral Hygiene is also important to reduce your risk of infection.  Remember - BRUSH YOUR TEETH THE MORNING OF SURGERY WITH YOUR REGULAR TOOTHPASTE  Please do not use if you have an allergy to CHG or antibacterial soaps. If your skin becomes reddened/irritated stop using the CHG.  Do not shave (including legs and underarms) for at least 48 hours prior to first CHG shower. It is OK to shave your face.  Please follow these instructions carefully.   1. Shower the NIGHT BEFORE SURGERY and the MORNING OF SURGERY  2. If you chose to wash your hair, wash your hair first as usual with  your normal shampoo.  3. After you shampoo, rinse your hair and body thoroughly to remove the shampoo.  4. Wash Face and genitals (private parts) with your normal soap.   5.  Shower the NIGHT BEFORE SURGERY and the MORNING OF SURGERY with CHG Soap.   6. Use CHG Soap as you would any other liquid soap. You can apply CHG directly to the skin and wash gently with a scrungie or a clean washcloth.   7. Apply the CHG Soap to your body ONLY FROM THE NECK DOWN.  Do not  use on open wounds or open sores. Avoid contact with your eyes, ears, mouth and genitals (private parts). Wash Face and genitals (private parts)  with your normal soap.   8. Wash thoroughly, paying special attention to the area where your surgery will be performed.  9. Thoroughly rinse your body with warm water from the neck down.  10. DO NOT shower/wash with your normal soap after using and rinsing off the CHG Soap.  11. Pat yourself dry with a CLEAN TOWEL.  12. Wear CLEAN PAJAMAS to bed the night before surgery  13. Place CLEAN SHEETS on your bed the night before your surgery  14. DO NOT SLEEP WITH PETS.   Day of Surgery: Take a shower with CHG soap. Wear Clean/Comfortable clothing the morning of surgery Do not apply any deodorants/lotions.   Remember to brush your teeth WITH YOUR REGULAR TOOTHPASTE.   Please read over the following fact sheets that you were given.

## 2020-10-22 ENCOUNTER — Encounter (HOSPITAL_COMMUNITY): Payer: Self-pay

## 2020-10-22 ENCOUNTER — Encounter (HOSPITAL_COMMUNITY)
Admission: RE | Admit: 2020-10-22 | Discharge: 2020-10-22 | Disposition: A | Discharge status: Home / Self Care | Payer: PPO | Source: Ambulatory Visit | Attending: Orthopedic Surgery | Admitting: Orthopedic Surgery

## 2020-10-22 ENCOUNTER — Other Ambulatory Visit: Payer: Self-pay

## 2020-10-22 DIAGNOSIS — R413 Other amnesia: Secondary | ICD-10-CM | POA: Insufficient documentation

## 2020-10-22 DIAGNOSIS — K219 Gastro-esophageal reflux disease without esophagitis: Secondary | ICD-10-CM | POA: Diagnosis not present

## 2020-10-22 DIAGNOSIS — Z6841 Body Mass Index (BMI) 40.0 and over, adult: Secondary | ICD-10-CM | POA: Insufficient documentation

## 2020-10-22 DIAGNOSIS — S46212A Strain of muscle, fascia and tendon of other parts of biceps, left arm, initial encounter: Secondary | ICD-10-CM | POA: Diagnosis not present

## 2020-10-22 DIAGNOSIS — Z01812 Encounter for preprocedural laboratory examination: Secondary | ICD-10-CM | POA: Diagnosis not present

## 2020-10-22 DIAGNOSIS — Z79891 Long term (current) use of opiate analgesic: Secondary | ICD-10-CM | POA: Diagnosis not present

## 2020-10-22 DIAGNOSIS — Z20822 Contact with and (suspected) exposure to covid-19: Secondary | ICD-10-CM | POA: Insufficient documentation

## 2020-10-22 DIAGNOSIS — M810 Age-related osteoporosis without current pathological fracture: Secondary | ICD-10-CM | POA: Diagnosis not present

## 2020-10-22 DIAGNOSIS — E119 Type 2 diabetes mellitus without complications: Secondary | ICD-10-CM | POA: Insufficient documentation

## 2020-10-22 DIAGNOSIS — Z955 Presence of coronary angioplasty implant and graft: Secondary | ICD-10-CM | POA: Insufficient documentation

## 2020-10-22 DIAGNOSIS — E785 Hyperlipidemia, unspecified: Secondary | ICD-10-CM | POA: Diagnosis not present

## 2020-10-22 DIAGNOSIS — Z79899 Other long term (current) drug therapy: Secondary | ICD-10-CM | POA: Insufficient documentation

## 2020-10-22 DIAGNOSIS — R430 Anosmia: Secondary | ICD-10-CM | POA: Insufficient documentation

## 2020-10-22 DIAGNOSIS — M75102 Unspecified rotator cuff tear or rupture of left shoulder, not specified as traumatic: Secondary | ICD-10-CM | POA: Insufficient documentation

## 2020-10-22 DIAGNOSIS — E890 Postprocedural hypothyroidism: Secondary | ICD-10-CM | POA: Diagnosis not present

## 2020-10-22 DIAGNOSIS — Z7902 Long term (current) use of antithrombotics/antiplatelets: Secondary | ICD-10-CM | POA: Diagnosis not present

## 2020-10-22 DIAGNOSIS — I10 Essential (primary) hypertension: Secondary | ICD-10-CM | POA: Diagnosis not present

## 2020-10-22 DIAGNOSIS — I2510 Atherosclerotic heart disease of native coronary artery without angina pectoris: Secondary | ICD-10-CM | POA: Diagnosis not present

## 2020-10-22 LAB — CBC
HCT: 43.4 % (ref 36.0–46.0)
Hemoglobin: 13.9 g/dL (ref 12.0–15.0)
MCH: 29.7 pg (ref 26.0–34.0)
MCHC: 32 g/dL (ref 30.0–36.0)
MCV: 92.7 fL (ref 80.0–100.0)
Platelets: 206 10*3/uL (ref 150–400)
RBC: 4.68 MIL/uL (ref 3.87–5.11)
RDW: 13.4 % (ref 11.5–15.5)
WBC: 4.8 10*3/uL (ref 4.0–10.5)
nRBC: 0 % (ref 0.0–0.2)

## 2020-10-22 LAB — BASIC METABOLIC PANEL
Anion gap: 7 (ref 5–15)
BUN: 9 mg/dL (ref 8–23)
CO2: 26 mmol/L (ref 22–32)
Calcium: 8.9 mg/dL (ref 8.9–10.3)
Chloride: 107 mmol/L (ref 98–111)
Creatinine, Ser: 0.7 mg/dL (ref 0.44–1.00)
GFR, Estimated: >60 mL/min (ref >60)
Glucose, Bld: 129 mg/dL — ABNORMAL HIGH (ref 70–99)
Potassium: 4.6 mmol/L (ref 3.5–5.1)
Sodium: 140 mmol/L (ref 135–145)

## 2020-10-22 LAB — SARS CORONAVIRUS 2 (TAT 6-24 HRS): SARS Coronavirus 2: NEGATIVE

## 2020-10-22 LAB — SURGICAL PCR SCREEN
MRSA, PCR: NEGATIVE
Staphylococcus aureus: NEGATIVE

## 2020-10-22 LAB — GLUCOSE, CAPILLARY: Glucose-Capillary: 118 mg/dL — ABNORMAL HIGH (ref 70–99)

## 2020-10-22 NOTE — Progress Notes (Signed)
PCP: [name], MD Cardiologist: Dr. Daneen Schick,   EKG: 12/27/19 CXR: 12/18/19 ECHO: denies Stress Test:  denies Cardiac Cath: 12/23/19  OSA/CPAP: No  ASA: No Blood Thinners:  Continue Plavix per Stann Mainland and Cardiology  Fasting Blood Sugar- Does not check BG.  States is pre-diabetic.  No meds Checks Blood Sugar_0__ times a day  Covid test 10/22/20 pending  Anesthesia Review: Yes, recent cardiac stent on Plavix. Cardiac clearance 10/01/20 in Epic.   Patient denies shortness of breath, fever, cough, and chest pain at PAT appointment.  Patient verbalized understanding of instructions provided today at the PAT appointment.  Patient asked to review instructions at home and day of surgery.

## 2020-10-23 NOTE — Anesthesia Preprocedure Evaluation (Addendum)
Anesthesia Evaluation  Patient identified by MRN, date of birth, ID band Patient awake    Reviewed: Allergy & Precautions, NPO status , Patient's Chart, lab work & pertinent test results, reviewed documented beta blocker date and time   History of Anesthesia Complications Negative for: history of anesthetic complications  Airway Mallampati: III  TM Distance: >3 FB Neck ROM: Full    Dental  (+) Dental Advisory Given   Pulmonary former smoker,    Pulmonary exam normal        Cardiovascular hypertension, Pt. on medications and Pt. on home beta blockers + CAD and + Cardiac Stents  Normal cardiovascular exam   '21 Cath - 99% large first diagonal treated with 2.75 x 15 Onyx postdilated to 3.0 mm and 0% stenosis. Left main is widely patent LAD contains mid eccentric 50% stenosis. Circumflex gives 1 large tortuous obtuse marginal and is free of disease. Dominant with luminal irregularities in the mid body.  No significant obstruction. Very mild mid anterior wall hypokinesis.  EF 50 to 55% which is mildly reduced for female.  LVEDP is 19 mmHg.      Neuro/Psych PSYCHIATRIC DISORDERS Depression  RLS     GI/Hepatic Neg liver ROS, GERD  Controlled,  Endo/Other  diabetes, Type 2Hypothyroidism Morbid obesity  Renal/GU negative Renal ROS     Musculoskeletal  (+) Arthritis ,   Abdominal (+) + obese,   Peds  Hematology  On plavix    Anesthesia Other Findings Chronic pain syndrome Covid test negative   Reproductive/Obstetrics                           Anesthesia Physical Anesthesia Plan  ASA: III  Anesthesia Plan: General   Post-op Pain Management:  Regional for Post-op pain   Induction: Intravenous  PONV Risk Score and Plan: 3 and Treatment may vary due to age or medical condition, Ondansetron and Dexamethasone  Airway Management Planned: Oral ETT  Additional Equipment:  None  Intra-op Plan:   Post-operative Plan: Extubation in OR  Informed Consent: I have reviewed the patients History and Physical, chart, labs and discussed the procedure including the risks, benefits and alternatives for the proposed anesthesia with the patient or authorized representative who has indicated his/her understanding and acceptance.     Dental advisory given  Plan Discussed with: CRNA and Anesthesiologist  Anesthesia Plan Comments:       Anesthesia Quick Evaluation

## 2020-10-23 NOTE — Progress Notes (Signed)
Anesthesia Chart Review:  Case: [id number] Date/Time: 10/25/20 1215   Procedure: Left shoulder arthroscopic, possible rotator cuff repair, extensive debridement, subacromial decompression (Left ) - 90 mins   Anesthesia type: Choice   Pre-op diagnosis: Left shoulder biceps tear, impingement, cu41ff tear   Location: [facility] OR ROOM 06 / [facility]   Surgeons: Nicholes Stairs, MD      DISCUSSION: Patient is a 70 year old female scheduled for the above procedure. She is s/p DES D1 12/23/19. She sustained a right proximal humerus fracture sn underwent right reverse shoulder arthroplasty 04/03/20 while on DAPT.  History includes former smoker (quit 02/17/87), HTN, HLD, exertional dyspnea, DM2, CAD (s/p DES D1 12/23/19), partial thyroidectomy (right?), hypothyroidism, GERD, memory disorder, anosmia, RLS, osteoporosis, chronic pain syndrome, right reverse shoulder arthroplasty 04/03/20. BMI is consistent with morbid obesity.   Last cardiology visit with Dr. Tamala Julian 08/17/20. He advised that patient continue Plavix if surgery done before July 2022. Dr. Stann Mainland did not think Plavix had to be held for this procedure. Per 10/01/20 cardiology notation by Tarri Glenn, PA-C, "[name] was last seen on 08/17/20 by Dr. Tamala Julian.  Since that day, RASHEEN SCHEWE has done well from a cardiac perspective, denies anginal symtpoms. Per Dr. Dennie Maizes note, we do not need to hold Plavix prior to the procedure.   Therefore, based on ACC/AHA guidelines, the patient would be at acceptable risk for the planned procedure without further cardiovascular testing..." She is continuing Plavix.   10/22/2020 presurgical COVID-19 test negative.  Anesthesia team to evaluate on the day of surgery.   VS: BP (!) 161/72   Pulse 73   Temp 36.9 C (Oral)   Resp 18   Ht 5' 1.5" (1.562 m)   Wt 120.8 kg   SpO2 97%   BMI 49.52 kg/m   PROVIDERS: [name], DO is PCP  Daneen Schick, MD is cardiologist   LABS: Labs reviewed: Acceptable  for surgery. A1c 6.2% 03/28/20. (all labs ordered are listed, but only abnormal results are displayed)  Labs Reviewed  BASIC METABOLIC PANEL - Abnormal; Notable for the following components:      Result Value   Glucose, Bld 129 (*)    All other components within normal limits  GLUCOSE, CAPILLARY - Abnormal; Notable for the following components:   Glucose-Capillary 118 (*)    All other components within normal limits  SURGICAL PCR SCREEN  SARS CORONAVIRUS 2 (TAT 6-24 HRS)  CBC    EKG: 12/24/19: Normal sinus rhythm Possible Inferior infarct , age undetermined Possible Anterior infarct , age undetermined ST & T wave abnormality, consider lateral ischemia Abnormal ECG Confirmed by Charolette Forward ([id number]) on 12/24/2019 11:34:25 PM   CV: Cardiac cath 12/23/19:  2-week history of progressive new onset angina.  99% large first diagonal treated with 2.75 x 15 Onyx postdilated to 3.0 mm and 0% stenosis.  Left main is widely patent  LAD contains mid eccentric 50% stenosis.  Circumflex gives 1 large tortuous obtuse marginal and is free of disease.  Dominant with luminal irregularities in the mid body.  No significant obstruction.  Very mild mid anterior wall hypokinesis.  EF 50 to 55% which is mildly reduced for female.  LVEDP is 19 mmHg.  RECOMMENDATIONS:   Dual antiplatelet therapy for at least 6 months.  Initially aspirin and Brilinta.  After 4 to 6 weeks Brilinta could be decreased in intensity to clopidogrel plus aspirin.  High intensity statin therapy added.  Low-dose beta-blocker therapy added. (Dr.  Tamala Julian notes that "Interventional procedure complicated by small radial artery requiring switch over to femoral approach which was complicated by late femoral bleed." She reported dyspnea with Brilinta and was later changed to Plavix, and ASA stopped due to bruising while on DAPT.)   Past Medical History:  Diagnosis Date  . 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 Chronic pain syndrome 07/09/2017  . Coronary artery disease   . Depression   . Diabetes ([place]) 12/02/2013   no current meds   . Dyspnea on exertion 09/20/2015  . Gastro-esophageal reflux   . GERD (gastroesophageal reflux disease) 05/25/2012  . Hyperlipidemia   . Hypertension   . Hypothyroidism 05/25/2012   Last Assessment & Plan:  States was started after a partial thyroidectomy but does not recall any abnormal lab value and no symptoms.  Discussed as is on low dose, would be reasonable to discontinue and recheck in 6-8 weeks which she would like to do. Last Assessment & Plan:  States was started after a partial thyroidectomy but does not recall any abnormal lab value and no symptoms.  Discussed as   . Lumbar spondylosis 07/09/2017  . Memory disorder 10/30/2015  . Obesity, morbid, BMI 40.0-49.9 ([place]) 06/02/2014  . Osteoporosis 05/02/2013   Overview:  DEXA 11/14 DEXA 11/14  . Restless legs syndrome 05/25/2012   Last Assessment & Plan:  patient reports using clonazepam about once weekly .  Discussed nonpharmacologic tips for treating RLS.  States other medicatiosn she had treid had not been effective (does not remember name) Will refill for now.  Advised if use increases, will readdress in future  . RLS (restless legs syndrome)   . Thyroid disease   . 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 URI, acute 09/20/2015  . Vitamin B 12 deficiency   . Vitamin D deficiency 05/25/2012    Past Surgical History:  Procedure Laterality Date  . ABDOMINAL HYSTERECTOMY    . APPENDECTOMY    . CARDIAC CATHETERIZATION  12/23/2019  . CATARACT EXTRACTION    . CHOLECYSTECTOMY    . CORONARY STENT INTERVENTION N/A 12/23/2019   Procedure: CORONARY STENT INTERVENTION;  Surgeon: Belva Crome, MD;  Location: [facility];  Service: Cardiovascular;  Laterality: N/A;  . JOINT REPLACEMENT     2 total knee replacement  . LASIK    . LEFT HEART CATH AND CORONARY  ANGIOGRAPHY N/A 12/23/2019   Procedure: LEFT HEART CATH AND CORONARY ANGIOGRAPHY;  Surgeon: Belva Crome, MD;  Location: [facility];  Service: Cardiovascular;  Laterality: N/A;  . REVERSE SHOULDER ARTHROPLASTY Right 04/03/2020   Procedure: REVERSE SHOULDER ARTHROPLASTY;  Surgeon: Nicholes Stairs, MD;  Location: [facility];  Service: Orthopedics;  Laterality: Right;  2.5 hrs  . THYROIDECTOMY    . TONSILLECTOMY      MEDICATIONS: . acetaminophen (TYLENOL) 500 MG tablet  . atorvastatin (LIPITOR) 80 MG tablet  . clonazePAM (KLONOPIN) 1 MG tablet  . clopidogrel (PLAVIX) 75 MG tablet  . losartan (COZAAR) 50 MG tablet  . metoprolol succinate (TOPROL-XL) 25 MG 24 hr tablet  . Multiple Vitamins-Minerals (CENTRUM SILVER 50+WOMEN) TABS  . ondansetron (ZOFRAN ODT) 8 MG disintegrating tablet  . oxyCODONE-acetaminophen (PERCOCET) 10-325 MG tablet  . pantoprazole (PROTONIX) 40 MG tablet   No current facility-administered medications for this encounter.    Myra Gianotti, PA-C Surgical Short Stay/Anesthesiology [facility] Phone [phone] [facility] Phone (  [phone] 10/23/2020 4:37 PM

## 2020-10-25 ENCOUNTER — Encounter (HOSPITAL_COMMUNITY)
Admission: RE | Disposition: A | Discharge status: Home / Self Care | Payer: Self-pay | Source: Home / Self Care | Attending: Orthopedic Surgery

## 2020-10-25 ENCOUNTER — Ambulatory Visit (HOSPITAL_COMMUNITY)
Admission: RE | Admit: 2020-10-25 | Discharge: 2020-10-25 | Disposition: A | Discharge status: Home / Self Care | Payer: PPO | Attending: Orthopedic Surgery | Admitting: Orthopedic Surgery

## 2020-10-25 ENCOUNTER — Ambulatory Visit (HOSPITAL_COMMUNITY): Payer: PPO | Admitting: Vascular Surgery

## 2020-10-25 ENCOUNTER — Other Ambulatory Visit: Payer: Self-pay

## 2020-10-25 ENCOUNTER — Ambulatory Visit (HOSPITAL_COMMUNITY): Payer: PPO | Admitting: Anesthesiology

## 2020-10-25 DIAGNOSIS — M25812 Other specified joint disorders, left shoulder: Secondary | ICD-10-CM | POA: Diagnosis not present

## 2020-10-25 DIAGNOSIS — M75102 Unspecified rotator cuff tear or rupture of left shoulder, not specified as traumatic: Secondary | ICD-10-CM | POA: Diagnosis not present

## 2020-10-25 DIAGNOSIS — X58XXXA Exposure to other specified factors, initial encounter: Secondary | ICD-10-CM | POA: Diagnosis not present

## 2020-10-25 DIAGNOSIS — Z96659 Presence of unspecified artificial knee joint: Secondary | ICD-10-CM | POA: Diagnosis not present

## 2020-10-25 DIAGNOSIS — M65812 Other synovitis and tenosynovitis, left shoulder: Secondary | ICD-10-CM | POA: Diagnosis not present

## 2020-10-25 DIAGNOSIS — S46112A Strain of muscle, fascia and tendon of long head of biceps, left arm, initial encounter: Secondary | ICD-10-CM | POA: Insufficient documentation

## 2020-10-25 DIAGNOSIS — Z791 Long term (current) use of non-steroidal anti-inflammatories (NSAID): Secondary | ICD-10-CM | POA: Diagnosis not present

## 2020-10-25 DIAGNOSIS — M7542 Impingement syndrome of left shoulder: Secondary | ICD-10-CM | POA: Diagnosis not present

## 2020-10-25 DIAGNOSIS — S43432A Superior glenoid labrum lesion of left shoulder, initial encounter: Secondary | ICD-10-CM | POA: Diagnosis not present

## 2020-10-25 DIAGNOSIS — Z79899 Other long term (current) drug therapy: Secondary | ICD-10-CM | POA: Insufficient documentation

## 2020-10-25 DIAGNOSIS — G8918 Other acute postprocedural pain: Secondary | ICD-10-CM | POA: Diagnosis not present

## 2020-10-25 DIAGNOSIS — Z87891 Personal history of nicotine dependence: Secondary | ICD-10-CM | POA: Insufficient documentation

## 2020-10-25 DIAGNOSIS — M75122 Complete rotator cuff tear or rupture of left shoulder, not specified as traumatic: Secondary | ICD-10-CM | POA: Insufficient documentation

## 2020-10-25 DIAGNOSIS — M7552 Bursitis of left shoulder: Secondary | ICD-10-CM | POA: Diagnosis not present

## 2020-10-25 DIAGNOSIS — M19012 Primary osteoarthritis, left shoulder: Secondary | ICD-10-CM | POA: Diagnosis not present

## 2020-10-25 DIAGNOSIS — Z955 Presence of coronary angioplasty implant and graft: Secondary | ICD-10-CM | POA: Insufficient documentation

## 2020-10-25 DIAGNOSIS — M94212 Chondromalacia, left shoulder: Secondary | ICD-10-CM | POA: Insufficient documentation

## 2020-10-25 HISTORY — PX: SHOULDER ARTHROSCOPY WITH ROTATOR CUFF REPAIR AND SUBACROMIAL DECOMPRESSION: SHX5686

## 2020-10-25 LAB — GLUCOSE, CAPILLARY
Glucose-Capillary: 119 mg/dL — ABNORMAL HIGH (ref 70–99)
Glucose-Capillary: 91 mg/dL (ref 70–99)
Glucose-Capillary: 97 mg/dL (ref 70–99)

## 2020-10-25 SURGERY — SHOULDER ARTHROSCOPY WITH ROTATOR CUFF REPAIR AND SUBACROMIAL DECOMPRESSION
Anesthesia: General | Laterality: Left

## 2020-10-25 MED ORDER — FENTANYL CITRATE (PF) 250 MCG/5ML IJ SOLN
INTRAMUSCULAR | Status: DC | PRN
  Administered 2020-10-25 (×3): 50 ug via INTRAVENOUS

## 2020-10-25 MED ORDER — ONDANSETRON HCL 4 MG/2ML IJ SOLN
INTRAMUSCULAR | Status: AC
  Filled 2020-10-25: qty 2

## 2020-10-25 MED ORDER — PHENYLEPHRINE 40 MCG/ML (10ML) SYRINGE FOR IV PUSH (FOR BLOOD PRESSURE SUPPORT)
PREFILLED_SYRINGE | INTRAVENOUS | Status: DC | PRN
  Administered 2020-10-25: 80 ug via INTRAVENOUS

## 2020-10-25 MED ORDER — SUGAMMADEX SODIUM 200 MG/2ML IV SOLN
INTRAVENOUS | Status: DC | PRN
  Administered 2020-10-25: 200 mg via INTRAVENOUS

## 2020-10-25 MED ORDER — ONDANSETRON 4 MG PO TBDP: 4.0000 mg | ORAL_TABLET | Freq: Three times a day (TID) | ORAL | 0 refills | Status: AC | PRN

## 2020-10-25 MED ORDER — ROCURONIUM BROMIDE 10 MG/ML (PF) SYRINGE
PREFILLED_SYRINGE | INTRAVENOUS | Status: DC | PRN
  Administered 2020-10-25: 60 mg via INTRAVENOUS

## 2020-10-25 MED ORDER — OXYCODONE HCL 5 MG/5ML PO SOLN: 5.0000 mg | Freq: Once | ORAL | Status: DC | PRN

## 2020-10-25 MED ORDER — FENTANYL CITRATE (PF) 250 MCG/5ML IJ SOLN
INTRAMUSCULAR | Status: AC
  Filled 2020-10-25: qty 5

## 2020-10-25 MED ORDER — SODIUM CHLORIDE 0.9 % IR SOLN
Status: DC | PRN
  Administered 2020-10-25 (×2): 3000 mL

## 2020-10-25 MED ORDER — DEXAMETHASONE SODIUM PHOSPHATE 10 MG/ML IJ SOLN
INTRAMUSCULAR | Status: AC
  Filled 2020-10-25: qty 1

## 2020-10-25 MED ORDER — LIDOCAINE 2% (20 MG/ML) 5 ML SYRINGE
INTRAMUSCULAR | Status: DC | PRN
  Administered 2020-10-25: 60 mg via INTRAVENOUS

## 2020-10-25 MED ORDER — EPINEPHRINE PF 1 MG/ML IJ SOLN
INTRAMUSCULAR | Status: DC | PRN
  Administered 2020-10-25: 2 mL

## 2020-10-25 MED ORDER — MIDAZOLAM HCL 2 MG/2ML IJ SOLN
INTRAMUSCULAR | Status: AC
  Administered 2020-10-25: 1 mg via INTRAVENOUS
  Filled 2020-10-25: qty 2

## 2020-10-25 MED ORDER — HYDROCODONE-ACETAMINOPHEN 5-325 MG PO TABS: 1.0000 | ORAL_TABLET | Freq: Four times a day (QID) | ORAL | 0 refills | Status: DC | PRN

## 2020-10-25 MED ORDER — ONDANSETRON HCL 4 MG/2ML IJ SOLN
INTRAMUSCULAR | Status: DC | PRN
  Administered 2020-10-25: 4 mg via INTRAVENOUS

## 2020-10-25 MED ORDER — HYDRALAZINE HCL 20 MG/ML IJ SOLN: 10.0000 mg | INTRAMUSCULAR | Status: DC | PRN

## 2020-10-25 MED ORDER — ONDANSETRON HCL 4 MG/2ML IJ SOLN: 4.0000 mg | Freq: Once | INTRAMUSCULAR | Status: DC | PRN

## 2020-10-25 MED ORDER — LIDOCAINE 2% (20 MG/ML) 5 ML SYRINGE
INTRAMUSCULAR | Status: AC
  Filled 2020-10-25: qty 5

## 2020-10-25 MED ORDER — MIDAZOLAM HCL 2 MG/2ML IJ SOLN: 1.0000 mg | Freq: Once | INTRAMUSCULAR | Status: AC

## 2020-10-25 MED ORDER — CHLORHEXIDINE GLUCONATE 0.12 % MT SOLN
15.0000 mL | Freq: Once | OROMUCOSAL | Status: AC
  Administered 2020-10-25: 15 mL via OROMUCOSAL
  Filled 2020-10-25: qty 15

## 2020-10-25 MED ORDER — DEXAMETHASONE SODIUM PHOSPHATE 10 MG/ML IJ SOLN
INTRAMUSCULAR | Status: DC | PRN
  Administered 2020-10-25: 5 mg via INTRAVENOUS

## 2020-10-25 MED ORDER — OXYCODONE HCL 5 MG PO TABS: 5.0000 mg | ORAL_TABLET | Freq: Once | ORAL | Status: DC | PRN

## 2020-10-25 MED ORDER — DEXTROSE 5 % IV SOLN
3.0000 g | INTRAVENOUS | Status: DC
  Filled 2020-10-25: qty 3000

## 2020-10-25 MED ORDER — FENTANYL CITRATE (PF) 100 MCG/2ML IJ SOLN
INTRAMUSCULAR | Status: AC
  Administered 2020-10-25: 50 ug via INTRAVENOUS
  Filled 2020-10-25: qty 2

## 2020-10-25 MED ORDER — EPINEPHRINE PF 1 MG/ML IJ SOLN
INTRAMUSCULAR | Status: AC
  Filled 2020-10-25: qty 2

## 2020-10-25 MED ORDER — ORAL CARE MOUTH RINSE
15.0000 mL | Freq: Once | OROMUCOSAL | Status: AC
Start: 2020-10-25 — End: 2020-10-25

## 2020-10-25 MED ORDER — PHENYLEPHRINE 40 MCG/ML (10ML) SYRINGE FOR IV PUSH (FOR BLOOD PRESSURE SUPPORT)
PREFILLED_SYRINGE | INTRAVENOUS | Status: AC
  Filled 2020-10-25: qty 10

## 2020-10-25 MED ORDER — DEXTROSE 5 % IV SOLN
INTRAVENOUS | Status: DC | PRN
  Administered 2020-10-25: 3 g via INTRAVENOUS

## 2020-10-25 MED ORDER — PROPOFOL 10 MG/ML IV BOLUS
INTRAVENOUS | Status: AC
  Filled 2020-10-25: qty 40

## 2020-10-25 MED ORDER — LACTATED RINGERS IV SOLN: INTRAVENOUS | Status: DC

## 2020-10-25 MED ORDER — BUPIVACAINE LIPOSOME 1.3 % IJ SUSP
INTRAMUSCULAR | Status: DC | PRN
  Administered 2020-10-25: 10 mL via PERINEURAL

## 2020-10-25 MED ORDER — FENTANYL CITRATE (PF) 100 MCG/2ML IJ SOLN: 50.0000 ug | Freq: Once | INTRAMUSCULAR | Status: AC

## 2020-10-25 MED ORDER — PROPOFOL 10 MG/ML IV BOLUS
INTRAVENOUS | Status: DC | PRN
  Administered 2020-10-25: 140 mg via INTRAVENOUS

## 2020-10-25 MED ORDER — FENTANYL CITRATE (PF) 100 MCG/2ML IJ SOLN: 25.0000 ug | INTRAMUSCULAR | Status: DC | PRN

## 2020-10-25 MED ORDER — BUPIVACAINE HCL (PF) 0.5 % IJ SOLN
INTRAMUSCULAR | Status: DC | PRN
  Administered 2020-10-25: 15 mL via PERINEURAL

## 2020-10-25 MED ORDER — PHENYLEPHRINE HCL-NACL 10-0.9 MG/250ML-% IV SOLN
INTRAVENOUS | Status: DC | PRN
  Administered 2020-10-25: 30 ug/min via INTRAVENOUS

## 2020-10-25 SURGICAL SUPPLY — 39 items
BLADE SURG 11 STRL SS (BLADE) ×2 IMPLANT
BURR OVAL 8 FLU 4.0X13 (MISCELLANEOUS) ×2 IMPLANT
CANNULA TWIST IN 8.25X7CM (CANNULA) ×4 IMPLANT
CLSR STERI-STRIP ANTIMIC 1/2X4 (GAUZE/BANDAGES/DRESSINGS) ×2 IMPLANT
COVER WAND RF STERILE (DRAPES) ×2 IMPLANT
CUTTER BONE 4.0MM X 13CM (MISCELLANEOUS) ×2 IMPLANT
DRAPE INCISE IOBAN 66X45 STRL (DRAPES) ×2 IMPLANT
DRAPE STERI 35X30 U-POUCH (DRAPES) ×2 IMPLANT
DRAPE U-SHAPE 47X51 STRL (DRAPES) ×2 IMPLANT
DRSG PAD ABDOMINAL 8X10 ST (GAUZE/BANDAGES/DRESSINGS) ×6 IMPLANT
DURAPREP 26ML APPLICATOR (WOUND CARE) ×2 IMPLANT
GAUZE SPONGE 4X4 12PLY STRL (GAUZE/BANDAGES/DRESSINGS) ×2 IMPLANT
GLOVE BIO SURGEON STRL SZ7.5 (GLOVE) ×4 IMPLANT
GLOVE SRG 8 PF TXTR STRL LF DI (GLOVE) ×1 IMPLANT
GLOVE SURG UNDER POLY LF SZ8 (GLOVE) ×2
GOWN STRL REUS W/ TWL LRG LVL3 (GOWN DISPOSABLE) ×1 IMPLANT
GOWN STRL REUS W/TWL LRG LVL3 (GOWN DISPOSABLE) ×2
GOWN STRL REUS W/TWL XL LVL3 (GOWN DISPOSABLE) ×4 IMPLANT
KIT BASIN OR (CUSTOM PROCEDURE TRAY) ×2 IMPLANT
KIT TURNOVER KIT B (KITS) ×2 IMPLANT
MANIFOLD NEPTUNE II (INSTRUMENTS) ×2 IMPLANT
NEEDLE SPNL 18GX3.5 QUINCKE PK (NEEDLE) ×2 IMPLANT
NS IRRIG 1000ML POUR BTL (IV SOLUTION) ×2 IMPLANT
PACK SHOULDER (CUSTOM PROCEDURE TRAY) ×2 IMPLANT
PAD ABD 8X10 STRL (GAUZE/BANDAGES/DRESSINGS) ×2 IMPLANT
PAD ARMBOARD 7.5X6 YLW CONV (MISCELLANEOUS) ×4 IMPLANT
PORT APPOLLO RF 90DEGREE MULTI (SURGICAL WAND) ×2 IMPLANT
SLEEVE ARM SUSPENSION SYSTEM (MISCELLANEOUS) ×2 IMPLANT
SLING ARM FOAM STRAP XLG (SOFTGOODS) ×2 IMPLANT
SPONGE LAP 4X18 RFD (DISPOSABLE) ×2 IMPLANT
SUT ETHILON 3 0 PS 1 (SUTURE) ×2 IMPLANT
SUT MON AB 3-0 SH 27 (SUTURE) ×2
SUT MON AB 3-0 SH27 (SUTURE) ×1 IMPLANT
TAPE CLOTH SURG 6X10 WHT LF (GAUZE/BANDAGES/DRESSINGS) ×2 IMPLANT
TAPE PAPER 3X10 WHT MICROPORE (GAUZE/BANDAGES/DRESSINGS) ×2 IMPLANT
TOWEL GREEN STERILE (TOWEL DISPOSABLE) ×2 IMPLANT
TOWEL GREEN STERILE FF (TOWEL DISPOSABLE) ×2 IMPLANT
TUBING ARTHROSCOPY IRRIG 16FT (MISCELLANEOUS) ×2 IMPLANT
WATER STERILE IRR 1000ML POUR (IV SOLUTION) ×2 IMPLANT

## 2020-10-25 NOTE — H&P (Signed)
ORTHOPAEDIC H and P  REQUESTING PHYSICIAN: Nicholes Stairs, MD  PCP:  [name], DO  Chief Complaint: Left shoulder pain  HPI: [name] is a 70 y.o. female who complains of recalcitrant left shoulder pain and weakness as well as inability to achieve significant improvement with conservative treatment.  She is here today for left shoulder arthroscopy including rotator cuff repair and debridement.  No new complaints today.  Past Medical History:  Diagnosis Date  . 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 Chronic pain syndrome 07/09/2017  . Coronary artery disease   . Depression   . Diabetes ([place]) 12/02/2013   no current meds   . Dyspnea on exertion 09/20/2015  . Gastro-esophageal reflux   . GERD (gastroesophageal reflux disease) 05/25/2012  . Hyperlipidemia   . Hypertension   . Hypothyroidism 05/25/2012   Last Assessment & Plan:  States was started after a partial thyroidectomy but does not recall any abnormal lab value and no symptoms.  Discussed as is on low dose, would be reasonable to discontinue and recheck in 6-8 weeks which she would like to do. Last Assessment & Plan:  States was started after a partial thyroidectomy but does not recall any abnormal lab value and no symptoms.  Discussed as   . Lumbar spondylosis 07/09/2017  . Memory disorder 10/30/2015  . Obesity, morbid, BMI 40.0-49.9 ([place]) 06/02/2014  . Osteoporosis 05/02/2013   Overview:  DEXA 11/14 DEXA 11/14  . Restless legs syndrome 05/25/2012   Last Assessment & Plan:  patient reports using clonazepam about once weekly .  Discussed nonpharmacologic tips for treating RLS.  States other medicatiosn she had treid had not been effective (does not remember name) Will refill for now.  Advised if use increases, will readdress in future  . RLS (restless legs syndrome)   . Thyroid disease   . 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 URI, acute  09/20/2015  . Vitamin B 12 deficiency   . Vitamin D deficiency 05/25/2012   Past Surgical History:  Procedure Laterality Date  . ABDOMINAL HYSTERECTOMY    . APPENDECTOMY    . CARDIAC CATHETERIZATION  12/23/2019  . CATARACT EXTRACTION    . CHOLECYSTECTOMY    . CORONARY STENT INTERVENTION N/A 12/23/2019   Procedure: CORONARY STENT INTERVENTION;  Surgeon: Belva Crome, MD;  Location: [facility];  Service: Cardiovascular;  Laterality: N/A;  . JOINT REPLACEMENT     2 total knee replacement  . LASIK    . LEFT HEART CATH AND CORONARY ANGIOGRAPHY N/A 12/23/2019   Procedure: LEFT HEART CATH AND CORONARY ANGIOGRAPHY;  Surgeon: Belva Crome, MD;  Location: [facility];  Service: Cardiovascular;  Laterality: N/A;  . REVERSE SHOULDER ARTHROPLASTY Right 04/03/2020   Procedure: REVERSE SHOULDER ARTHROPLASTY;  Surgeon: Nicholes Stairs, MD;  Location: [facility];  Service: Orthopedics;  Laterality: Right;  2.5 hrs  . THYROIDECTOMY    . TONSILLECTOMY     Social History   Socioeconomic History  . Marital status: Divorced    Spouse name: Not on file  . Number of children: Not on file  . Years of education: Not on file  . Highest education level: Not on file  Occupational History  . Not on file  Tobacco Use  . Smoking status: Former Smoker    Types: Cigarettes    Quit date: 02/17/1987  Years since quitting: 33.7  . Smokeless tobacco: Never Used  Vaping Use  . Vaping Use: Never used  Substance and Sexual Activity  . Alcohol use: No  . Drug use: No  . Sexual activity: Not Currently    Birth control/protection: Post-menopausal, Surgical    Comment: Hysterectomy  Other Topics Concern  . Not on file  Social History Narrative   Lives at home alone   Right-handed   Social Determinants of Health   Financial Resource Strain: Not on file  Food Insecurity: Not on file  Transportation Needs: Not on file  Physical Activity: Not on file  Stress: Not on file  Social  Connections: Not on file   Family History  Problem Relation Age of Onset  . Cancer Mother   . Hypertension Mother   . Cancer Father   . Diabetes Sister   . Heart disease Brother   . Diabetes Brother   . Diabetes Brother   . Breast cancer Neg Hx    No Known Allergies Prior to Admission medications   Medication Sig Start Date End Date Taking? Authorizing Provider  atorvastatin (LIPITOR) 80 MG tablet Take 1 tablet (80 mg total) by mouth daily. 07/20/20  Yes Belva Crome, MD  clonazePAM (KLONOPIN) 1 MG tablet Take 1 mg by mouth at bedtime as needed (restless leg).  03/05/20  Yes [provider]  clopidogrel (PLAVIX) 75 MG tablet Take 1 tablet (75 mg total) by mouth daily. 07/20/20  Yes Belva Crome, MD  losartan (COZAAR) 50 MG tablet Take 1 tablet (50 mg total) by mouth daily. 07/20/20  Yes Belva Crome, MD  metoprolol succinate (TOPROL-XL) 25 MG 24 hr tablet Take 1 tablet (25 mg total) by mouth daily. 07/20/20  Yes Belva Crome, MD  Multiple Vitamins-Minerals (CENTRUM SILVER 50+WOMEN) TABS Take 1 tablet by mouth daily.   Yes [provider]  pantoprazole (PROTONIX) 40 MG tablet Take 1 tablet (40 mg total) by mouth daily. 12/29/19  Yes Belva Crome, MD  acetaminophen (TYLENOL) 500 MG tablet Take 1 tablet (500 mg total) by mouth every 6 (six) hours. Patient not taking: No sig reported 04/04/20   Jonelle Sidle D, PA  ondansetron (ZOFRAN ODT) 8 MG disintegrating tablet Take 1 tablet (8 mg total) by mouth every 8 (eight) hours as needed for nausea or vomiting. Patient not taking: Reported on 10/16/2020 03/22/20   Deno Etienne, DO  oxyCODONE-acetaminophen (PERCOCET) 10-325 MG tablet Take 1 tablet by mouth every 4 (four) hours as needed for up to 30 doses for pain (Take one tablet every 4 to 6 hours as needed for moderate-severe pain). Patient not taking: No sig reported 04/04/20   Jonelle Sidle D, PA   No results found.  Positive ROS: All other systems have been reviewed and  were otherwise negative with the exception of those mentioned in the HPI and as above.  Physical Exam: General: Alert, no acute distress Cardiovascular: No pedal edema Respiratory: No cyanosis, no use of accessory musculature GI: No organomegaly, abdomen is soft and non-tender Skin: No lesions in the area of chief complaint Neurologic: Sensation intact distally Psychiatric: Patient is competent for consent with normal mood and affect Lymphatic: No axillary or cervical lymphadenopathy  MUSCULOSKELETAL:  Left shoulder is warm and well-perfused.  No open wounds.  Distally neurovascular intact.  Assessment: 1.  Left shoulder rotator cuff tear complete 2.  Left shoulder degenerative labral tears 3.  Left shoulder subacromial impingement  Plan: -Plans to proceed  today with left shoulder arthroscopy with debridement and extensive fashion, biceps tenotomy, rotator cuff repair, and subacromial decompression.  We again discussed the risk of bleeding, infection, damage to surrounding nerves and vessels, stiffness, weakness, failure of repairs, need for further surgery, and the risk of anesthesia.  She has provided informed consent.  -Plan for discharge home postoperatively from PACU.    Nicholes Stairs, MD Cell [phone]    10/25/2020 12:42 PM

## 2020-10-25 NOTE — Discharge Instructions (Signed)
Ortho d/c instructions:  -Maintain postoperative bandages for 3 days.  You may remove these on the third day, and begin showering at that time.  Do not submerge underwater.  -Maintain your sling for comfort only.  You may remove and begin activities as tolerated.  -Apply ice to the left shoulder for 20 to 30 minutes out of each hour.  For mild to moderate pain use Tylenol and Advil around-the-clock.  For breakthrough pain use oxycodone as necessary.  -Return to see Dr. Stann Mainland in 2 weeks for routine postoperative check.

## 2020-10-25 NOTE — Transfer of Care (Signed)
Immediate Anesthesia Transfer of Care Note  Patient: [name]  Procedure(s) Performed: Left shoulder arthroscopic, extensive debridement, subacromial decompression (Left )  Patient Location: PACU  Anesthesia Type:GA combined with regional for post-op pain  Level of Consciousness: drowsy  Airway & Oxygen Therapy: Patient Spontanous Breathing and Patient connected to nasal cannula oxygen  Post-op Assessment: Report given to RN and Post -op Vital signs reviewed and stable  Post vital signs: Reviewed and stable  Last Vitals:  Vitals Value Taken Time  BP 159/105 10/25/20 1518  Temp    Pulse 87 10/25/20 1520  Resp 23 10/25/20 1520  SpO2 97 % 10/25/20 1520  Vitals shown include unvalidated device data.  Last Pain:  Vitals:   10/25/20 1114  TempSrc:   PainSc: 0-No pain         Complications: No complications documented.

## 2020-10-25 NOTE — Anesthesia Procedure Notes (Signed)
Procedure Name: Intubation Date/Time: 10/25/2020 1:44 PM Performed by: Flossie Dibble, CRNA Pre-anesthesia Checklist: Patient identified, Patient being monitored, Timeout performed, Emergency Drugs available and Suction available Patient Re-evaluated:Patient Re-evaluated prior to induction Oxygen Delivery Method: Circle System Utilized Preoxygenation: Pre-oxygenation with 100% oxygen Induction Type: IV induction Ventilation: Mask ventilation without difficulty and Oral airway inserted - appropriate to patient size Laryngoscope Size: Mac and 3 Grade View: Grade I Tube type: Oral Tube size: 7.0 mm Number of attempts: 1 Airway Equipment and Method: stylet Placement Confirmation: ETT inserted through vocal cords under direct vision,  positive ETCO2 and breath sounds checked- equal and bilateral Secured at: 21 cm Tube secured with: Tape Dental Injury: Teeth and Oropharynx as per pre-operative assessment

## 2020-10-25 NOTE — Anesthesia Procedure Notes (Signed)
Anesthesia Regional Block: Interscalene brachial plexus block   Pre-Anesthetic Checklist: ,, timeout performed, Correct Patient, Correct Site, Correct Laterality, Correct Procedure, Correct Position, site marked, Risks and benefits discussed,  Surgical consent,  Pre-op evaluation,  At surgeon's request and post-op pain management  Laterality: Left  Prep: chloraprep       Needles:  Injection technique: Single-shot  Needle Type: Echogenic Needle     Needle Length: 5cm  Needle Gauge: 21     Additional Needles:   Narrative:  Start time: 10/25/2020 11:53 AM End time: 10/25/2020 11:56 AM Injection made incrementally with aspirations every 5 mL.  Performed by: Personally  Anesthesiologist: Audry Pili, MD  Additional Notes: No pain on injection. No increased resistance to injection. Injection made in 5cc increments. Good needle visualization. Patient tolerated the procedure well.

## 2020-10-25 NOTE — Brief Op Note (Signed)
10/25/2020  2:58 PM  PATIENT:  [name]  70 y.o. female  PRE-OPERATIVE DIAGNOSIS:   1.  Left shoulder rotator cuff tear complete 2.  Left shoulder degenerative labral tears 3.  Left shoulder subacromial impingement   POST-OPERATIVE DIAGNOSIS:  1.  Left shoulder rotator cuff tear complete 2.  Left shoulder degenerative labral tears 3.  Left shoulder subacromial impingement 4.  Left shoulder glenohumeral osteoarthritis  PROCEDURE:  Procedure(s) with comments: Left shoulder arthroscopic, extensive debridement, subacromial decompression (Left) - 90 mins  SURGEON:  Surgeon(s) and Role:    * Stann Mainland, Elly Modena, MD - Primary  PHYSICIAN ASSISTANT:  Jonelle Sidle, PA-C   ANESTHESIA:   regional and general  EBL:  5 cc  BLOOD ADMINISTERED:none  DRAINS: none   LOCAL MEDICATIONS USED:  NONE  SPECIMEN:  No Specimen  DISPOSITION OF SPECIMEN:  N/A  COUNTS:  YES  TOURNIQUET:  * No tourniquets in log *  DICTATION: .Note written in EPIC  PLAN OF CARE: dc home  PATIENT DISPOSITION:  PACU - hemodynamically stable.   Delay start of Pharmacological VTE agent (>24hrs) due to surgical blood loss or risk of bleeding: not applicable

## 2020-10-25 NOTE — Op Note (Addendum)
10/25/2020   PATIENT:  [name]    PRE-OPERATIVE DIAGNOSIS:   1.Left shoulder rotator cuff tear complete 2.Left shoulder degenerative labral tears 3.Left shoulder subacromial impingement   POST-OPERATIVE DIAGNOSIS:  1.Left shoulder rotator cuff tear complete 2.Left shoulder degenerative labral tears 3.Left shoulder subacromial impingement 4.  Left shoulder glenohumeral osteoarthritis  PROCEDURE:   1.  Left shoulder arthroscopic extensive debridement, including rotator interval, anterior labrum, superior labrum, biceps tenotomy, chondroplasty of humeral head and glenoid fossa. 2.  Left shoulder arthroscopic subacromial decompression with release of CA ligament. 3.  Left shoulder arthroscopic biceps tenotomy  SURGEON:  Nicholes Stairs, MD  PHYSICIAN ASSISTANT: Jonelle Sidle, PA-C  Assistant attestation:  PA Mcclung was present for the entire procedure.  He was critical necessary for positioning the patient, prepping and draping, instrumentation of the shoulder including the procedures listed above, and closure as well as transport back to PACU.  ANESTHESIA:   General  ESTIMATED BLOOD LOSS: 5 cc  PREOPERATIVE INDICATIONS:  [name] is a  70 y.o. female with a diagnosis of Left shoulder biceps tear, impingement, cuff tear who failed conservative measures and elected for surgical management.    The risks benefits and alternatives were discussed with the patient preoperatively including but not limited to the risks of infection, bleeding, nerve injury, cardiopulmonary complications, the need for revision surgery, among others, and the patient was willing to proceed.  OPERATIVE IMPLANTS:  None  OPERATIVE FINDINGS:  On the diagnostic portion of the arthroscopy we did note multiple areas of grade IV chondromalacia on the humeral head superiorly.  In the central and posterior portion of the glenoid there were also areas of grade III and IV  chondromalacia.  There was upper border tearing of the subscapularis noted as well.  Split tearing.  There was no complete tear noted.  The biceps was flattened and torn at the midportion of the intra-articular portion.  Degenerative labral tears noted superiorly, anteriorly, and posteriorly.  There was partial tearing of the supraspinatus less than 10%.  On the bursal space there was bursal sided tearing as well of less than 10%.  Moderate subacromial bursitis and type II acromion.   OPERATIVE PROCEDURE: The patient was brought to the operating room and placed in the supine position. General anesthesia was administered. IV antibiotics were given. General anesthesia was administered.   The upper extremity was examined and found to be grossly unstable particularly to anterior testing. The upper extremity was prepped and draped in the usual sterile fashion. The patient was in a semilateral decubitus position.  Time out was performed. Diagnostic arthroscopy was carried out the above-named findings.   We began the procedure by establishing a posterior viewing portal 2 cm distal and 1 cm medial to the posterior lateral corner of the acromion.  We then established a mid glenoid working portal off the upper border of the subscapularis.  We did this via spinal needle localization.  We began the procedure with the extensive debridement.  There were multiple areas of grade III and IV chondromalacia of the superior humeral head.  We used a motorized shaver to debride these and perform debridement chondroplasty.  Likewise, the superior and posterior glenoid had similar grade 3 and 4 areas of chondromalacia.  We performed chondroplasty of those with the motorized shaver.  We then released the biceps tendon via tenotomy with a arthroscopic scissor.  Next, we extensively debrided the rotator interval, and superior labrum as well as anterior labrum.  Next, we moved to the subacromial space.  Once in the subacromial space  we established a working portal and the lateral aspect of the shoulder.  This was done via spinal needle localization approximately 4 cm lateral to the lateral edge of the acromion in line with the [facility] joint midpoint.  We performed extensive bursectomy with motorized shaver.  We then assessed the rotator cuff.  There was no full-thickness cuff tear noted.  We then performed subacromial decompression.  First utilizing the radiofrequency ablation wand to perform periosteal dissection to evaluate the anterolateral aspect of the acromion.  There was a type II acromion noted.  We then switched the camera to the lateral portal and worked from the posterior portal and used a cutting block technique to decompress with motorized bur the type II spur.  Once we were done this had been transition to a smooth type I morphology.  Pictures were taken throughout the procedure.  Instruments were removed from the shoulder.  Arthroscopic portals were closed with 3-0 Monocryl and Steri-Strips.  Standard sterile dressing and sling was applied.  Patient was awakened.   The patient was awakened and returned to the PACU in stable and satisfactory condition. There were no complications and the patient tolerated the procedure well.  All counts were correct.  Disposition:  The patient will be weightbearing as tolerated to the left upper extremity.  She can begin range of motion as tolerated.  She can wear a sling for comfort.  I will see them back in the office in 2 weeks for a wound check.

## 2020-10-25 NOTE — Anesthesia Postprocedure Evaluation (Signed)
Anesthesia Post Note  Patient: [name]  Procedure(s) Performed: Left shoulder arthroscopic, extensive debridement, subacromial decompression (Left )     Patient location during evaluation: PACU Anesthesia Type: General Level of consciousness: awake and alert Pain management: pain level controlled Vital Signs Assessment: post-procedure vital signs reviewed and stable Respiratory status: spontaneous breathing, nonlabored ventilation and respiratory function stable Cardiovascular status: blood pressure returned to baseline and stable Postop Assessment: no apparent nausea or vomiting Anesthetic complications: no   No complications documented.  Last Vitals:  Vitals:   10/25/20 1532 10/25/20 1547  BP: (!) 167/51 (!) 155/63  Pulse: 80 79  Resp: 20 20  Temp:  36.6 C  SpO2: 99% 96%    Last Pain:  Vitals:   10/25/20 1547  TempSrc:   PainSc: 0-No pain                 Audry Pili

## 2020-10-26 ENCOUNTER — Encounter (HOSPITAL_COMMUNITY): Payer: Self-pay | Admitting: Orthopedic Surgery

## 2020-11-09 ENCOUNTER — Ambulatory Visit: Payer: PPO | Admitting: Physical Therapy

## 2020-12-06 DIAGNOSIS — Z4789 Encounter for other orthopedic aftercare: Secondary | ICD-10-CM | POA: Diagnosis not present

## 2020-12-21 ENCOUNTER — Other Ambulatory Visit: Payer: Self-pay | Admitting: *Deleted

## 2020-12-21 MED ORDER — PANTOPRAZOLE SODIUM 40 MG PO TBEC: 40.0000 mg | DELAYED_RELEASE_TABLET | Freq: Every day | ORAL | 3 refills | Status: DC

## 2020-12-21 NOTE — Telephone Encounter (Signed)
Ok to fill 

## 2021-01-17 DIAGNOSIS — S42211K Unspecified displaced fracture of surgical neck of right humerus, subsequent encounter for fracture with nonunion: Secondary | ICD-10-CM | POA: Diagnosis not present

## 2021-01-17 DIAGNOSIS — Z4789 Encounter for other orthopedic aftercare: Secondary | ICD-10-CM | POA: Diagnosis not present

## 2021-01-27 DIAGNOSIS — R0602 Shortness of breath: Secondary | ICD-10-CM | POA: Diagnosis not present

## 2021-01-27 DIAGNOSIS — R059 Cough, unspecified: Secondary | ICD-10-CM | POA: Diagnosis not present

## 2021-01-31 DIAGNOSIS — R0689 Other abnormalities of breathing: Secondary | ICD-10-CM | POA: Diagnosis not present

## 2021-02-17 NOTE — Progress Notes (Signed)
Cardiology Office Note:    Date:  02/18/2021   ID:  [name], DOB [birth year], MRN [id number]  PCP:  [name], DO  Cardiologist:  Sinclair Grooms, MD   Referring MD: [name], DO   Chief Complaint  Patient presents with   Coronary Artery Disease   Hypertension   Hyperlipidemia    History of Present Illness:    [name] is a 70 y.o. female with a hx of  hypertension, obesity, hyperlipidemia, DM II, with recent unstable angina treated with DE stent circumflex with good result.  Intolerant of Brilinta due to dyspnea.  Interventional procedure complicated by small radial artery requiring switch over to femoral approach which was complicated by late femoral bleed.   After stenting, she had significant difficulty with dyspnea on Brilinta.  She also had bruising that was unacceptable.  Brilinta was discontinued and Plavix started.  She continued to have bruising on Plavix and aspirin.  Aspirin was discontinued and the bruising stopped.  She does not feel any symptoms similar to prior ischemic symptoms.  She has chronic shortness of breath with exertion.  She is relatively sedentary.  She denies dyspnea at rest/orthopnea/edema.  No palpitations or syncope has occurred.  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  Discussed as is on low dose, would be reasonable to discontinue and recheck in 6-8 weeks which she would like to do. Last Assessment & Plan:  States was  started after a partial thyroidectomy but does not recall any abnormal lab value and no symptoms.  Discussed as    Lumbar spondylosis 07/09/2017   Memory disorder 10/30/2015   Obesity, morbid, BMI 40.0-49.9 ([place]) 06/02/2014   Osteoporosis 05/02/2013   Overview:  DEXA 11/14 DEXA 11/14   Restless legs syndrome 05/25/2012   Last Assessment & Plan:  patient reports using clonazepam about once weekly .  Discussed nonpharmacologic tips for treating RLS.  States other medicatiosn she had treid had not been effective (does not remember name) Will refill for now.  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   URI, acute 09/20/2015   Vitamin B 12 deficiency    Vitamin D deficiency 05/25/2012    Past Surgical History:  Procedure Laterality Date   ABDOMINAL HYSTERECTOMY     APPENDECTOMY     CARDIAC CATHETERIZATION  12/23/2019   CATARACT EXTRACTION     CHOLECYSTECTOMY     CORONARY STENT INTERVENTION N/A 12/23/2019   Procedure: CORONARY STENT INTERVENTION;  Surgeon: Belva Crome, MD;  Location: [facility];  Service: Cardiovascular;  Laterality: N/A;   JOINT REPLACEMENT     2 total knee replacement   LASIK     LEFT HEART CATH AND CORONARY ANGIOGRAPHY N/A 12/23/2019   Procedure: LEFT HEART  CATH AND CORONARY ANGIOGRAPHY;  Surgeon: Belva Crome, MD;  Location: [facility];  Service: Cardiovascular;  Laterality: N/A;   REVERSE SHOULDER ARTHROPLASTY Right 04/03/2020   Procedure: REVERSE SHOULDER ARTHROPLASTY;  Surgeon: Nicholes Stairs, MD;  Location: [facility];  Service: Orthopedics;  Laterality: Right;  2.5 hrs   SHOULDER ARTHROSCOPY WITH ROTATOR CUFF REPAIR AND SUBACROMIAL DECOMPRESSION Left 10/25/2020   Procedure: Left shoulder arthroscopic, extensive debridement, subacromial decompression;  Surgeon: Nicholes Stairs, MD;  Location: [facility];  Service:  Orthopedics;  Laterality: Left;  90 mins   THYROIDECTOMY     TONSILLECTOMY      Current Medications: Current Meds  Medication Sig   atorvastatin (LIPITOR) 80 MG tablet Take 1 tablet (80 mg total) by mouth daily.   clonazePAM (KLONOPIN) 1 MG tablet Take 1 mg by mouth at bedtime as needed (restless leg).    HYDROcodone-acetaminophen (NORCO) 5-325 MG tablet Take 1 tablet by mouth every 6 (six) hours as needed for moderate pain.   losartan (COZAAR) 50 MG tablet Take 1 tablet (50 mg total) by mouth daily.   metoprolol succinate (TOPROL-XL) 25 MG 24 hr tablet Take 1 tablet (25 mg total) by mouth daily.   Multiple Vitamins-Minerals (CENTRUM SILVER 50+WOMEN) TABS Take 1 tablet by mouth daily.   pantoprazole (PROTONIX) 40 MG tablet Take 1 tablet (40 mg total) by mouth daily.   [DISCONTINUED] clopidogrel (PLAVIX) 75 MG tablet Take 1 tablet (75 mg total) by mouth daily.     Allergies:   Patient has no known allergies.   Social History   Socioeconomic History   Marital status: Divorced    Spouse name: Not on file   Number of children: Not on file   Years of education: Not on file   Highest education level: Not on file  Occupational History   Not on file  Tobacco Use   Smoking status: Former    Types: Cigarettes    Quit date: 02/17/1987    Years since quitting: 34.0   Smokeless tobacco: Never  Vaping Use   Vaping Use: Never used  Substance and Sexual Activity   Alcohol use: No   Drug use: No   Sexual activity: Not Currently    Birth control/protection: Post-menopausal, Surgical    Comment: Hysterectomy  Other Topics Concern   Not on file  Social History Narrative   Lives at home alone   Right-handed   Social Determinants of Health   Financial Resource Strain: Not on file  Food Insecurity: Not on file  Transportation Needs: Not on file  Physical Activity: Not on file  Stress: Not on file  Social Connections: Not on file     Family History: The patient's family history  includes Cancer in her father and mother; Diabetes in her brother, brother, and sister; Heart disease in her brother; Hypertension in her mother. There is no history of Breast cancer.  ROS:   Please see the history of present illness.    Obese.  Does not want to be on Plavix.  No other medication side effects.  Laboratory data from earlier this year have been reviewed.  All other systems reviewed and are negative.  EKGs/Labs/Other Studies Reviewed:    The following studies were reviewed today: Laboratory data from March 2022: LDL 57 Hemoglobin A1c 6.3  EKG:  EKG performed 02/18/2021 demonstrates normal sinus rhythm, precordial T wave abnormality V1 through V3, small Q-wave in lead III.  When compared to July 2021, no significant change  has occurred.  Recent Labs: 10/22/2020: BUN 9; Creatinine, Ser 0.70; Hemoglobin 13.9; Platelets 206; Potassium 4.6; Sodium 140  Recent Lipid Panel    Component Value Date/Time   CHOL 133 02/03/2020 0935   TRIG 75 02/03/2020 0935   HDL 67 02/03/2020 0935   CHOLHDL 2.0 02/03/2020 0935   LDLCALC 51 02/03/2020 0935    Physical Exam:    VS:  BP 128/88   Pulse 76   Ht '5\' 2"'$  (1.575 m)   Wt 273 lb 3.2 oz (123.9 kg)   SpO2 99%   BMI 49.97 kg/m     Wt Readings from Last 3 Encounters:  02/18/21 273 lb 3.2 oz (123.9 kg)  10/25/20 258 lb (117 kg)  10/22/20 266 lb 6 oz (120.8 kg)     GEN: Morbid obesit. No acute distress HEENT: Normal NECK: No JVD. LYMPHATICS: No lymphadenopathy CARDIAC: No murmur. RRR no gallop, or edema. VASCULAR:  Normal Pulses. No bruits. RESPIRATORY:  Clear to auscultation without rales, wheezing or rhonchi  ABDOMEN: Soft, non-tender, non-distended, No pulsatile mass, MUSCULOSKELETAL: No deformity  SKIN: Warm and dry NEUROLOGIC:  Alert and oriented x 3 PSYCHIATRIC:  Normal affect   ASSESSMENT:    1. Coronary artery disease involving native coronary artery of native heart with unstable angina pectoris ([place])   2.  Hyperlipidemia LDL goal <70   3. Essential hypertension   4. Obesity, morbid, BMI 40.0-49.9 ([place])   5. Controlled type 2 diabetes mellitus without complication, without long-term current use of insulin ([place])   6. Long-term current use of opiate analgesic   7. Dyspnea on exertion    PLAN:    In order of problems listed above:  Secondary prevention reviewed.  Aspirin 81 mg/day.  Discontinue Plavix. LDL is at target on Lipitor. Blood pressure is at target on Toprol and Cozaar. Weight continues to be an issue and has increased by 15 pounds over the summer.  I encouraged decrease caloric intake and increase physical activity. A1c is well controlled.  If dyspnea continues, consider SGLT2 therapy has management strategy for diastolic heart failure.  This is likely contributory to the patient's shortness of breath. Did not discuss Possibly related to diastolic heart failure.  Should be considered.   Overall education and awareness concerning primary/secondary risk prevention was discussed in detail: LDL less than 70, hemoglobin A1c less than 7, blood pressure target less than 130/80 mmHg, >150 minutes of moderate aerobic activity per week, avoidance of smoking, weight control (via diet and exercise), and continued surveillance/management of/for obstructive sleep apnea.    Medication Adjustments/Labs and Tests Ordered: Current medicines are reviewed at length with the patient today.  Concerns regarding medicines are outlined above.  Orders Placed This Encounter  Procedures   EKG 12-Lead   No orders of the defined types were placed in this encounter.   Patient Instructions  Medication Instructions:  1) DISCONTINUE Plavix 2) START Aspirin '81mg'$  once daily  *If you need a refill on your cardiac medications before your next appointment, please call your pharmacy*   Lab Work: None If you have labs (blood work) drawn today and your tests are completely normal, you will receive your results  only by: [place] (if you have MyChart) OR A paper copy in the mail If you have any lab test that is abnormal or we need to change your treatment, we will call you to review the results.   Testing/Procedures: None   Follow-Up: At [facility], you and your health needs are  our priority.  As part of our continuing mission to provide you with exceptional heart care, we have created designated Provider Care Teams.  These Care Teams include your primary Cardiologist (physician) and Advanced Practice Providers (APPs -  Physician Assistants and Nurse Practitioners) who all work together to provide you with the care you need, when you need it.  We recommend signing up for the patient portal called "MyChart".  Sign up information is provided on this After Visit Summary.  MyChart is used to connect with patients for Virtual Visits (Telemedicine).  Patients are able to view lab/test results, encounter notes, upcoming appointments, etc.  Non-urgent messages can be sent to your provider as well.   To learn more about what you can do with MyChart, go to [email or web address].    Your next appointment:   1 year(s)  The format for your next appointment:   In Person  Provider:   You may see Sinclair Grooms, MD or one of the following Advanced Practice Providers on your designated Care Team:   Cecilie Kicks, NP   Other Instructions     Signed, Sinclair Grooms, MD  02/18/2021 8:26 AM    [place]

## 2021-02-18 ENCOUNTER — Ambulatory Visit: Payer: PPO | Admitting: Interventional Cardiology

## 2021-02-18 ENCOUNTER — Other Ambulatory Visit: Payer: Self-pay

## 2021-02-18 ENCOUNTER — Encounter: Payer: Self-pay | Admitting: Interventional Cardiology

## 2021-02-18 VITALS — BP 128/88 | HR 76 | Ht 62.0 in | Wt 273.2 lb

## 2021-02-18 DIAGNOSIS — E119 Type 2 diabetes mellitus without complications: Secondary | ICD-10-CM

## 2021-02-18 DIAGNOSIS — R0609 Other forms of dyspnea: Secondary | ICD-10-CM

## 2021-02-18 DIAGNOSIS — R0600 Dyspnea, unspecified: Secondary | ICD-10-CM

## 2021-02-18 DIAGNOSIS — E785 Hyperlipidemia, unspecified: Secondary | ICD-10-CM

## 2021-02-18 DIAGNOSIS — Z79891 Long term (current) use of opiate analgesic: Secondary | ICD-10-CM

## 2021-02-18 DIAGNOSIS — I25110 Atherosclerotic heart disease of native coronary artery with unstable angina pectoris: Secondary | ICD-10-CM | POA: Diagnosis not present

## 2021-02-18 DIAGNOSIS — I10 Essential (primary) hypertension: Secondary | ICD-10-CM

## 2021-02-18 NOTE — Patient Instructions (Signed)
Medication Instructions:  1) DISCONTINUE Plavix 2) START Aspirin '81mg'$  once daily  *If you need a refill on your cardiac medications before your next appointment, please call your pharmacy*   Lab Work: None If you have labs (blood work) drawn today and your tests are completely normal, you will receive your results only by: [place] (if you have MyChart) OR A paper copy in the mail If you have any lab test that is abnormal or we need to change your treatment, we will call you to review the results.   Testing/Procedures: None   Follow-Up: At [facility], you and your health needs are our priority.  As part of our continuing mission to provide you with exceptional heart care, we have created designated Provider Care Teams.  These Care Teams include your primary Cardiologist (physician) and Advanced Practice Providers (APPs -  Physician Assistants and Nurse Practitioners) who all work together to provide you with the care you need, when you need it.  We recommend signing up for the patient portal called "MyChart".  Sign up information is provided on this After Visit Summary.  MyChart is used to connect with patients for Virtual Visits (Telemedicine).  Patients are able to view lab/test results, encounter notes, upcoming appointments, etc.  Non-urgent messages can be sent to your provider as well.   To learn more about what you can do with MyChart, go to [email or web address].    Your next appointment:   1 year(s)  The format for your next appointment:   In Person  Provider:   You may see Sinclair Grooms, MD or one of the following Advanced Practice Providers on your designated Care Team:   Cecilie Kicks, NP   Other Instructions

## 2021-04-22 DIAGNOSIS — M5416 Radiculopathy, lumbar region: Secondary | ICD-10-CM | POA: Diagnosis not present

## 2021-05-10 DIAGNOSIS — H04123 Dry eye syndrome of bilateral lacrimal glands: Secondary | ICD-10-CM | POA: Diagnosis not present

## 2021-05-27 ENCOUNTER — Ambulatory Visit: Payer: PPO | Admitting: Family Medicine

## 2021-06-10 ENCOUNTER — Other Ambulatory Visit: Payer: Self-pay

## 2021-06-10 ENCOUNTER — Encounter (HOSPITAL_COMMUNITY): Payer: Self-pay | Admitting: Emergency Medicine

## 2021-06-10 ENCOUNTER — Observation Stay (HOSPITAL_COMMUNITY)
Admission: EM | Admit: 2021-06-10 | Discharge: 2021-06-12 | Disposition: A | Discharge status: Home / Self Care | Payer: PPO | Attending: Internal Medicine | Admitting: Internal Medicine

## 2021-06-10 ENCOUNTER — Emergency Department (HOSPITAL_COMMUNITY): Payer: PPO

## 2021-06-10 DIAGNOSIS — Z96653 Presence of artificial knee joint, bilateral: Secondary | ICD-10-CM | POA: Diagnosis not present

## 2021-06-10 DIAGNOSIS — I110 Hypertensive heart disease with heart failure: Secondary | ICD-10-CM | POA: Insufficient documentation

## 2021-06-10 DIAGNOSIS — Z79899 Other long term (current) drug therapy: Secondary | ICD-10-CM | POA: Diagnosis not present

## 2021-06-10 DIAGNOSIS — E119 Type 2 diabetes mellitus without complications: Secondary | ICD-10-CM

## 2021-06-10 DIAGNOSIS — K219 Gastro-esophageal reflux disease without esophagitis: Secondary | ICD-10-CM | POA: Diagnosis not present

## 2021-06-10 DIAGNOSIS — I25118 Atherosclerotic heart disease of native coronary artery with other forms of angina pectoris: Secondary | ICD-10-CM

## 2021-06-10 DIAGNOSIS — Z7982 Long term (current) use of aspirin: Secondary | ICD-10-CM | POA: Insufficient documentation

## 2021-06-10 DIAGNOSIS — E1169 Type 2 diabetes mellitus with other specified complication: Secondary | ICD-10-CM

## 2021-06-10 DIAGNOSIS — I10 Essential (primary) hypertension: Secondary | ICD-10-CM

## 2021-06-10 DIAGNOSIS — E039 Hypothyroidism, unspecified: Secondary | ICD-10-CM | POA: Insufficient documentation

## 2021-06-10 DIAGNOSIS — Z955 Presence of coronary angioplasty implant and graft: Secondary | ICD-10-CM | POA: Insufficient documentation

## 2021-06-10 DIAGNOSIS — R079 Chest pain, unspecified: Secondary | ICD-10-CM | POA: Diagnosis not present

## 2021-06-10 DIAGNOSIS — Z87891 Personal history of nicotine dependence: Secondary | ICD-10-CM | POA: Diagnosis not present

## 2021-06-10 DIAGNOSIS — Z96611 Presence of right artificial shoulder joint: Secondary | ICD-10-CM | POA: Insufficient documentation

## 2021-06-10 DIAGNOSIS — Z20822 Contact with and (suspected) exposure to covid-19: Secondary | ICD-10-CM | POA: Insufficient documentation

## 2021-06-10 DIAGNOSIS — E782 Mixed hyperlipidemia: Secondary | ICD-10-CM

## 2021-06-10 DIAGNOSIS — R072 Precordial pain: Secondary | ICD-10-CM | POA: Diagnosis not present

## 2021-06-10 DIAGNOSIS — I5032 Chronic diastolic (congestive) heart failure: Secondary | ICD-10-CM

## 2021-06-10 DIAGNOSIS — I2510 Atherosclerotic heart disease of native coronary artery without angina pectoris: Secondary | ICD-10-CM | POA: Diagnosis not present

## 2021-06-10 DIAGNOSIS — R0789 Other chest pain: Secondary | ICD-10-CM | POA: Diagnosis not present

## 2021-06-10 LAB — CBC
HCT: 43.7 % (ref 36.0–46.0)
Hemoglobin: 13.7 g/dL (ref 12.0–15.0)
MCH: 29.5 pg (ref 26.0–34.0)
MCHC: 31.4 g/dL (ref 30.0–36.0)
MCV: 94.2 fL (ref 80.0–100.0)
Platelets: 203 10*3/uL (ref 150–400)
RBC: 4.64 MIL/uL (ref 3.87–5.11)
RDW: 13.2 % (ref 11.5–15.5)
WBC: 5.4 10*3/uL (ref 4.0–10.5)
nRBC: 0 % (ref 0.0–0.2)

## 2021-06-10 LAB — TROPONIN I (HIGH SENSITIVITY)
Troponin I (High Sensitivity): <2 ng/L (ref <18)
Troponin I (High Sensitivity): 2 ng/L (ref <18)
Troponin I (High Sensitivity): 3 ng/L (ref <18)

## 2021-06-10 LAB — GLUCOSE, CAPILLARY: Glucose-Capillary: 114 mg/dL — ABNORMAL HIGH (ref 70–99)

## 2021-06-10 LAB — RESP PANEL BY RT-PCR (FLU A&B, COVID) ARPGX2
Influenza A by PCR: NEGATIVE
Influenza B by PCR: NEGATIVE
SARS Coronavirus 2 by RT PCR: NEGATIVE

## 2021-06-10 LAB — BASIC METABOLIC PANEL
Anion gap: 8 (ref 5–15)
BUN: 10 mg/dL (ref 8–23)
CO2: 26 mmol/L (ref 22–32)
Calcium: 8.9 mg/dL (ref 8.9–10.3)
Chloride: 106 mmol/L (ref 98–111)
Creatinine, Ser: 0.76 mg/dL (ref 0.44–1.00)
GFR, Estimated: >60 mL/min (ref >60)
Glucose, Bld: 109 mg/dL — ABNORMAL HIGH (ref 70–99)
Potassium: 4.2 mmol/L (ref 3.5–5.1)
Sodium: 140 mmol/L (ref 135–145)

## 2021-06-10 LAB — D-DIMER, QUANTITATIVE: D-Dimer, Quant: 0.44 ug/mL-FEU (ref 0.00–0.50)

## 2021-06-10 MED ORDER — ACETAMINOPHEN 650 MG RE SUPP: 650.0000 mg | Freq: Four times a day (QID) | RECTAL | Status: DC | PRN

## 2021-06-10 MED ORDER — CLONAZEPAM 0.5 MG PO TABS: 1.0000 mg | ORAL_TABLET | Freq: Every evening | ORAL | Status: DC | PRN

## 2021-06-10 MED ORDER — ONDANSETRON HCL 4 MG PO TABS: 4.0000 mg | ORAL_TABLET | Freq: Four times a day (QID) | ORAL | Status: DC | PRN

## 2021-06-10 MED ORDER — ATORVASTATIN CALCIUM 80 MG PO TABS
80.0000 mg | ORAL_TABLET | Freq: Every day | ORAL | Status: DC
  Administered 2021-06-11 – 2021-06-12 (×2): 80 mg via ORAL
  Filled 2021-06-10 (×2): qty 1

## 2021-06-10 MED ORDER — POLYETHYLENE GLYCOL 3350 17 G PO PACK: 17.0000 g | PACK | Freq: Every day | ORAL | Status: DC | PRN

## 2021-06-10 MED ORDER — ASPIRIN EC 81 MG PO TBEC
81.0000 mg | DELAYED_RELEASE_TABLET | Freq: Every day | ORAL | Status: DC
  Administered 2021-06-11 – 2021-06-12 (×2): 81 mg via ORAL
  Filled 2021-06-10 (×2): qty 1

## 2021-06-10 MED ORDER — PANTOPRAZOLE SODIUM 40 MG PO TBEC
40.0000 mg | DELAYED_RELEASE_TABLET | Freq: Every day | ORAL | Status: DC
  Administered 2021-06-11 – 2021-06-12 (×2): 40 mg via ORAL
  Filled 2021-06-10 (×2): qty 1

## 2021-06-10 MED ORDER — NITROGLYCERIN 0.4 MG SL SUBL: 0.4000 mg | SUBLINGUAL_TABLET | SUBLINGUAL | Status: DC | PRN

## 2021-06-10 MED ORDER — HYDROCODONE-ACETAMINOPHEN 5-325 MG PO TABS: 1.0000 | ORAL_TABLET | Freq: Four times a day (QID) | ORAL | Status: DC | PRN

## 2021-06-10 MED ORDER — ONDANSETRON 4 MG PO TBDP: 4.0000 mg | ORAL_TABLET | Freq: Once | ORAL | Status: DC

## 2021-06-10 MED ORDER — LOSARTAN POTASSIUM 50 MG PO TABS
50.0000 mg | ORAL_TABLET | Freq: Every day | ORAL | Status: DC
  Administered 2021-06-11 – 2021-06-12 (×2): 50 mg via ORAL
  Filled 2021-06-10 (×2): qty 1

## 2021-06-10 MED ORDER — OXYCODONE-ACETAMINOPHEN 5-325 MG PO TABS: 2.0000 | ORAL_TABLET | Freq: Once | ORAL | Status: DC

## 2021-06-10 MED ORDER — ONDANSETRON HCL 4 MG/2ML IJ SOLN: 4.0000 mg | Freq: Four times a day (QID) | INTRAMUSCULAR | Status: DC | PRN

## 2021-06-10 MED ORDER — ACETAMINOPHEN 325 MG PO TABS: 650.0000 mg | ORAL_TABLET | Freq: Four times a day (QID) | ORAL | Status: DC | PRN

## 2021-06-10 MED ORDER — HYDRALAZINE HCL 20 MG/ML IJ SOLN: 10.0000 mg | Freq: Four times a day (QID) | INTRAMUSCULAR | Status: DC | PRN

## 2021-06-10 MED ORDER — ENOXAPARIN SODIUM 40 MG/0.4ML IJ SOSY
40.0000 mg | PREFILLED_SYRINGE | INTRAMUSCULAR | Status: DC
  Administered 2021-06-10 – 2021-06-11 (×2): 40 mg via SUBCUTANEOUS
  Filled 2021-06-10 (×2): qty 0.4

## 2021-06-10 MED ORDER — METOPROLOL SUCCINATE ER 25 MG PO TB24
25.0000 mg | ORAL_TABLET | Freq: Every day | ORAL | Status: DC
  Administered 2021-06-11 – 2021-06-12 (×2): 25 mg via ORAL
  Filled 2021-06-10 (×2): qty 1

## 2021-06-10 MED ORDER — INSULIN ASPART 100 UNIT/ML IJ SOLN: 0.0000 [IU] | Freq: Three times a day (TID) | INTRAMUSCULAR | Status: DC

## 2021-06-10 NOTE — Assessment & Plan Note (Signed)
•   Continuing home regimen of lipid lowering therapy.  Obtaining lipid panel in the morning

## 2021-06-10 NOTE — H&P (Addendum)
History and Physical    [name] LOV:[id number] DOB: March 03, 1951 DOA: 06/10/2021  PCP: [name], DO  Patient coming from: Home   Chief Complaint:  Chief Complaint  Patient presents with   Chest Pain     HPI:    70 year old female with past medical history of coronary artery disease (cath 12/2019 s/p stent to 99% 1st diag branch lesion), diastolic congestive heart failure (EF 50-55% 12/2019, no echo on file), non insulin dependent diabetes mellitus type 2, gastroesophageal reflux disease, hyperlipidemia, hypertension, hypothyroidism, restless leg syndrome who presented to [facility] emergency department with complaints of chest discomfort.  Patient explains that over the past 5 days she has been experiencing episodes of chest discomfort.  Episodes can occur with rest or with exertion.  Patient describes this chest discomfort as pressure-like in quality, moderate in intensity, worse with exertion and improved with rest.  Discomfort occasionally does radiate into the neck.  Each episode has lasted between 30-40 minutes.  Patient denies any associated shortness of breath, cough, peripheral edema, paroxysmal nocturnal dyspnea, pillow orthopnea, cough or fever.  Patient denies sick contacts, recent travel or contact with confirmed COVID-19 infection.  Patient's episodes continued to recur until she eventually presented to [facility] emergency department for evaluation.  Upon evaluation in the emergency department troponins were unremarkable with no evidence of EKG changes.  Patient was chest pain-free during her emergency department evaluation.  ER provider discussed case with Dr. Domenic Polite with cardiology and was recommended the patient undergo overnight observation for continued work-up with cardiology consultation in the morning.  The hospitalist group was then called to assess the patient for admission to the hospital.  Review of Systems:   Review of Systems   Cardiovascular:  Positive for chest pain.  Musculoskeletal:  Positive for neck pain.  All other systems reviewed and are negative.  Past Medical History:  Diagnosis Date   Anosmia 10/30/2015   Atypical facial pain 10/30/2015   Left upper face   Chronic pain syndrome 07/09/2017   Coronary artery disease    Depression    Diabetes (Post) 12/02/2013   no current meds    Dyspnea on exertion 09/20/2015   Gastro-esophageal reflux    GERD (gastroesophageal reflux disease) 05/25/2012   Hyperlipidemia    Hypertension    Hypothyroidism 05/25/2012   Last Assessment & Plan:  States was started after a partial thyroidectomy but does not recall any abnormal lab value and no symptoms.  Discussed as is on low dose, would be reasonable to discontinue and recheck in 6-8 weeks which she would like to do. Last Assessment & Plan:  States was started after a partial thyroidectomy but does not recall any abnormal lab value and no symptoms.  Discussed as    Lumbar spondylosis 07/09/2017   Memory disorder 10/30/2015   Obesity, morbid, BMI 40.0-49.9 ([place]) 06/02/2014   Osteoporosis 05/02/2013   Overview:  DEXA 11/14 DEXA 11/14   Restless legs syndrome 05/25/2012   Last Assessment & Plan:  patient reports using clonazepam about once weekly .  Discussed nonpharmacologic tips for treating RLS.  States other medicatiosn she had treid had not been effective (does not remember name) Will refill for now.  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   URI, acute 09/20/2015   Vitamin B 12 deficiency  Vitamin D deficiency 05/25/2012    Past Surgical History:  Procedure Laterality Date   ABDOMINAL HYSTERECTOMY     APPENDECTOMY     CARDIAC CATHETERIZATION  12/23/2019   CATARACT EXTRACTION     CHOLECYSTECTOMY     CORONARY STENT INTERVENTION N/A 12/23/2019   Procedure: CORONARY STENT  INTERVENTION;  Surgeon: Belva Crome, MD;  Location: [place] [facility];  Service: Cardiovascular;  Laterality: N/A;   JOINT REPLACEMENT     2 total knee replacement   LASIK     LEFT HEART CATH AND CORONARY ANGIOGRAPHY N/A 12/23/2019   Procedure: LEFT HEART CATH AND CORONARY ANGIOGRAPHY;  Surgeon: Belva Crome, MD;  Location: [place] [facility];  Service: Cardiovascular;  Laterality: N/A;   REVERSE SHOULDER ARTHROPLASTY Right 04/03/2020   Procedure: REVERSE SHOULDER ARTHROPLASTY;  Surgeon: Nicholes Stairs, MD;  Location: [facility];  Service: Orthopedics;  Laterality: Right;  2.5 hrs   SHOULDER ARTHROSCOPY WITH ROTATOR CUFF REPAIR AND SUBACROMIAL DECOMPRESSION Left 10/25/2020   Procedure: Left shoulder arthroscopic, extensive debridement, subacromial decompression;  Surgeon: Nicholes Stairs, MD;  Location: [place];  Service: Orthopedics;  Laterality: Left;  90 mins   THYROIDECTOMY     TONSILLECTOMY       reports that she quit smoking about 34 years ago. Her smoking use included cigarettes. She has never used smokeless tobacco. She reports that she does not drink alcohol and does not use drugs.  No Known Allergies  Family History  Problem Relation Age of Onset   Cancer Mother    Hypertension Mother    Cancer Father    Diabetes Sister    Heart disease Brother    Diabetes Brother    Diabetes Brother    Breast cancer Neg Hx      Prior to Admission medications   Medication Sig Start Date End Date Taking? Authorizing Provider  aspirin 81 MG EC tablet Take 81 mg by mouth daily.   Yes [provider]  atorvastatin (LIPITOR) 80 MG tablet Take 1 tablet (80 mg total) by mouth daily. 07/20/20  Yes Belva Crome, MD  clonazePAM (KLONOPIN) 1 MG tablet Take 1 mg by mouth at bedtime as needed (restless leg).  03/05/20  Yes [provider]  HYDROcodone-acetaminophen (NORCO) 5-325 MG tablet Take 1 tablet by mouth every 6 (six) hours as needed for moderate pain. 10/25/20  Yes  Nicholes Stairs, MD  losartan (COZAAR) 50 MG tablet Take 1 tablet (50 mg total) by mouth daily. 07/20/20  Yes Belva Crome, MD  metoprolol succinate (TOPROL-XL) 25 MG 24 hr tablet Take 1 tablet (25 mg total) by mouth daily. 07/20/20  Yes Belva Crome, MD  Multiple Vitamins-Minerals (CENTRUM SILVER 50+WOMEN) TABS Take 1 tablet by mouth daily.   Yes [provider]  pantoprazole (PROTONIX) 40 MG tablet Take 1 tablet (40 mg total) by mouth daily. 12/21/20  Yes Belva Crome, MD  ondansetron (ZOFRAN ODT) 4 MG disintegrating tablet Take 1 tablet (4 mg total) by mouth every 8 (eight) hours as needed. 10/25/20   Nicholes Stairs, MD    Physical Exam: Vitals:   06/10/21 1900 06/10/21 1930 06/10/21 2000 06/10/21 2035  BP: 133/66 122/63 113/71 (!) 147/83  Pulse: 73 71 69 70  Resp: 20 20 15 15   Temp:      SpO2: 98% 100% 100% 100%    Constitutional: Awake alert and oriented x3, no associated distress.   Skin: no rashes, no lesions, good  skin turgor noted. Eyes: Pupils are equally reactive to light.  No evidence of scleral icterus or conjunctival pallor.  ENMT: Moist mucous membranes noted.  Posterior pharynx clear of any exudate or lesions.   Neck: normal, supple, no masses, no thyromegaly.  No evidence of jugular venous distension.   Respiratory: clear to auscultation bilaterally, no wheezing, no crackles. Normal respiratory effort. No accessory muscle use.  Cardiovascular: Regular rate and rhythm, no murmurs / rubs / gallops. No extremity edema. 2+ pedal pulses. No carotid bruits.  Chest:   Nontender without crepitus or deformity.   Back:   Nontender without crepitus or deformity. Abdomen: Abdomen is soft and nontender.  No evidence of intra-abdominal masses.  Positive bowel sounds noted in all quadrants.   Musculoskeletal: No joint deformity upper and lower extremities. Good ROM, no contractures. Normal muscle tone.  Neurologic: CN 2-12 grossly intact. Sensation intact.   Patient moving all 4 extremities spontaneously.  Patient is following all commands.  Patient is responsive to verbal stimuli.   Psychiatric: Patient exhibits normal mood with appropriate affect.  Patient seems to possess insight as to their current situation.     Labs on Admission: I have personally reviewed following labs and imaging studies -   CBC: Recent Labs  Lab 06/10/21 1338  WBC 5.4  HGB 13.7  HCT 43.7  MCV 94.2  PLT 710   Basic Metabolic Panel: Recent Labs  Lab 06/10/21 1338  NA 140  K 4.2  CL 106  CO2 26  GLUCOSE 109*  BUN 10  CREATININE 0.76  CALCIUM 8.9   GFR: CrCl cannot be calculated (Unknown ideal weight.). Liver Function Tests: No results for input(s): AST, ALT, ALKPHOS, BILITOT, PROT, ALBUMIN in the last 168 hours. No results for input(s): LIPASE, AMYLASE in the last 168 hours. No results for input(s): AMMONIA in the last 168 hours. Coagulation Profile: No results for input(s): INR, PROTIME in the last 168 hours. Cardiac Enzymes: No results for input(s): CKTOTAL, CKMB, CKMBINDEX, TROPONINI in the last 168 hours. BNP (last 3 results) No results for input(s): PROBNP in the last 8760 hours. HbA1C: No results for input(s): HGBA1C in the last 72 hours. CBG: No results for input(s): GLUCAP in the last 168 hours. Lipid Profile: No results for input(s): CHOL, HDL, LDLCALC, TRIG, CHOLHDL, LDLDIRECT in the last 72 hours. Thyroid Function Tests: No results for input(s): TSH, T4TOTAL, FREET4, T3FREE, THYROIDAB in the last 72 hours. Anemia Panel: No results for input(s): VITAMINB12, FOLATE, FERRITIN, TIBC, IRON, RETICCTPCT in the last 72 hours. Urine analysis:    Component Value Date/Time   COLORURINE YELLOW 09/20/2015 1313   APPEARANCEUR CLOUDY (A) 09/20/2015 1313   LABSPEC 1.024 09/20/2015 1313   PHURINE 7.5 09/20/2015 1313   GLUCOSEU NEGATIVE 09/20/2015 1313   HGBUR NEGATIVE 09/20/2015 1313   BILIRUBINUR NEGATIVE 09/20/2015 1313   KETONESUR NEGATIVE  09/20/2015 1313   PROTEINUR NEGATIVE 09/20/2015 1313   NITRITE NEGATIVE 09/20/2015 1313   LEUKOCYTESUR SMALL (A) 09/20/2015 1313    Radiological Exams on Admission - Personally Reviewed: DG Chest 2 View  Result Date: 06/10/2021 CLINICAL DATA:  Chest pain EXAM: CHEST - 2 VIEW COMPARISON:  Chest x-ray 12/18/2019 FINDINGS: Heart size and mediastinal contours are within normal limits. No suspicious pulmonary opacities identified. No pleural effusion or pneumothorax visualized. No acute osseous abnormality appreciated. IMPRESSION: No acute intrathoracic process identified. Electronically Signed   By: Ofilia Neas M.D.   On: 06/10/2021 13:59    EKG: Personally reviewed.  Rhythm is  normal sinus rhythm with heart rate of 77 bpm.  No dynamic ST segment changes appreciated.  Assessment/Plan  * Chest pain Patient presenting with chest discomfort with both typical and atypical features Cycling cardiac enzymes, initial troponin unremarkable EKG reveals no dynamic ST segment change Chest imaging reveals no evidence of pneumonia, rib fracture Patient currently chest pain-free ER provider discussed case with cardiology who will evaluate patient in the morning in consultation N.p.o. after midnight in case of need for invasive or noninvasive ischemic work-up Obtaining D-dimer  Providing patient with antiplatelet and statin therapy. As needed nitroglycerin for further episodes of chest discomfort  Coronary artery disease of native artery of native heart with stable angina pectoris [facility]) Patient is currently chest pain free Monitoring patient on telemetry Continue home regimen of antiplatelet therapy, lipid lowering therapy and AV nodal blocking therapy Please see remainder of assessment and plan above   Chronic diastolic CHF (congestive heart failure) (HCC) Suspected diagnosis of diastolic heart failure per last outpatient cardiology note No actual echocardiogram on file, preserved ejection  fraction based on estimate during last cardiac catheterization No clinical evidence of cardiogenic volume overload Obtaining echocardiogram   Essential hypertension Resume patients home regimen of oral antihypertensives Titrate antihypertensive regimen as necessary to achieve adequate BP control PRN intravenous antihypertensives for excessively elevated blood pressure    Type 2 diabetes mellitus without complication, without long-term current use of insulin ([place]) Patient been placed on Accu-Cheks before every meal and nightly with sliding scale insulin Hemoglobin A1C ordered Diabetic Diet   Hypothyroidism    GERD without esophagitis Continuing home regimen of daily PPI therapy.   Mixed diabetic hyperlipidemia associated with type 2 diabetes mellitus ([place]) Continuing home regimen of lipid lowering therapy. Obtaining lipid panel in the morning        Code Status:  Full code  code status decision has been confirmed with: patient Family Communication: deferred   Status is: Observation  The patient remains OBS appropriate and will d/c before 2 midnights.       Vernelle Emerald MD [facility] Pager [phone]  If 7PM-7AM, please contact night-coverage [email or web address] Use universal  password for that web site. If you do not have the password, please call the hospital operator.  06/10/2021, 8:57 PM

## 2021-06-10 NOTE — ED Triage Notes (Signed)
Patient here with complaint of intermittent chest pressure. Also complains of palpitations that she feels in her neck. Patient denies diaphoresis and nausea. Patient alert, oriented, and in no apparent distress at this time.

## 2021-06-10 NOTE — ED Provider Notes (Signed)
Emergency Medicine Provider Triage Evaluation Note  [name] , a 70 y.o. female  was evaluated in triage.  Pt complains of chest pressure sure.  She has had left-sided chest pressure on and off for the past several days with associated palpitations and occasional racing in her heart that radiates into her neck.  She is status post MI with stent placement in 2019 and states that this sensation she has today feels the same.  She denies cough, fever, chills, nausea, vomiting, shortness of breath.  Review of Systems  Positive: Chest pressure Negative: Shortness of breath  Physical Exam  BP (!) 158/79 (BP Location: Right Arm)    Pulse 76    Temp 98.2 F (36.8 C)    Resp 18    SpO2 97%  Gen:   Awake, no distress   Resp:  Normal effort  MSK:   Moves extremities without difficulty  Other:  No chest wall tenderness  Medical Decision Making  Medically screening exam initiated at 1:38 PM.  Appropriate orders placed.  [name] was informed that the remainder of the evaluation will be completed by another provider, this initial triage assessment does not replace that evaluation, and the importance of remaining in the ED until their evaluation is complete.  Patient here with complaint of chest pressure and discomfort, history of cardiac disease.  Work-up initiated.   Margarita Mail, PA-C 06/10/21 1339    Regan Lemming, MD 06/10/21 1406

## 2021-06-10 NOTE — ED Provider Notes (Signed)
[facility] Provider Note  CSN: [id number] Arrival date & time: 06/10/21 1305  Chief Complaint(s) Chest Pain  HPI [name] is a 70 y.o. female    Chest Pain Pain location:  Substernal area Pain quality: pressure   Pain quality comment:  "odd sensation" Pain radiates to:  Does not radiate Pain severity:  Mild Onset quality:  Camacho Duration: 5 days of intermittent discomfort. episodes last approx 76min. Timing:  Intermittent Chronicity:  Recurrent (similar to prior angina) Relieved by:  Rest Worsened by:  Exertion Associated symptoms: no back pain, no cough, no nausea, no shortness of breath and no vomiting   Risk factors: coronary artery disease    Patient reports that this is similar pain that she had last year when she ultimately had a cath performed revealing 99% blockage requiring stenting.  Past Medical History Past Medical History:  Diagnosis Date   Anosmia 10/30/2015   Atypical facial pain 10/30/2015   Left upper face   Chronic pain syndrome 07/09/2017   Coronary artery disease    Depression    Diabetes ([place]) 12/02/2013   no current meds    Dyspnea on exertion 09/20/2015   Gastro-esophageal reflux    GERD (gastroesophageal reflux disease) 05/25/2012   Hyperlipidemia    Hypertension    Hypothyroidism 05/25/2012   Last Assessment & Plan:  States was started after a partial thyroidectomy but does not recall any abnormal lab value and no symptoms.  Discussed as is on low dose, would be reasonable to discontinue and recheck in 6-8 weeks which she would like to do. Last Assessment & Plan:  States was started after a partial thyroidectomy but does not recall any abnormal lab value and no symptoms.  Discussed as    Lumbar spondylosis 07/09/2017   Memory disorder 10/30/2015   Obesity, morbid, BMI 40.0-49.9 ([place]) 06/02/2014   Osteoporosis 05/02/2013   Overview:  DEXA 11/14 DEXA 11/14   Restless legs syndrome 05/25/2012   Last Assessment & Plan:   patient reports using clonazepam about once weekly .  Discussed nonpharmacologic tips for treating RLS.  States other medicatiosn she had treid had not been effective (does not remember name) Will refill for now.  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   URI, acute 09/20/2015   Vitamin B 12 deficiency    Vitamin D deficiency 05/25/2012   Patient Active Problem List   Diagnosis Date Noted   Chest pain 06/10/2021   Chronic diastolic CHF (congestive heart failure) ([place]) 06/10/2021   Coronary artery disease of native artery of native heart with stable angina pectoris ([place]) 06/10/2021   Mixed diabetic hyperlipidemia associated with type 2 diabetes mellitus ([place]) 06/10/2021   Closed fracture of right proximal humerus 04/03/2020   Groin hematoma 12/24/2019   Angina pectoris ([place]) 12/23/2019   Long-term current use of opiate analgesic 07/09/2017   Chronic pain syndrome 07/09/2017   Lumbar spondylosis 07/09/2017   Atypical facial pain 10/30/2015   Memory disorder 10/30/2015   Anosmia 10/30/2015   Dyspnea on exertion 09/20/2015   Unstable angina ([place]) 09/20/2015   URI, acute 09/20/2015   Obesity, morbid, BMI 40.0-49.9 ([place]) 06/02/2014   Essential hypertension    Hyperlipidemia    Type 2 diabetes mellitus without complication, without long-term current use of insulin ([place]) 12/02/2013   Depression 05/27/2013  Osteoporosis 05/02/2013   GERD without esophagitis 05/25/2012   Hypothyroidism 05/25/2012   Restless legs syndrome 05/25/2012   Vitamin D deficiency 05/25/2012   Home Medication(s) Prior to Admission medications   Medication Sig Start Date End Date Taking? Authorizing Provider  aspirin 81 MG EC tablet Take 81 mg by mouth daily.   Yes [provider]  atorvastatin (LIPITOR) 80 MG tablet Take 1 tablet (80 mg total) by mouth daily. 07/20/20   Yes Belva Crome, MD  clonazePAM (KLONOPIN) 1 MG tablet Take 1 mg by mouth at bedtime as needed (restless leg).  03/05/20  Yes [provider]  HYDROcodone-acetaminophen (NORCO) 5-325 MG tablet Take 1 tablet by mouth every 6 (six) hours as needed for moderate pain. 10/25/20  Yes Nicholes Stairs, MD  losartan (COZAAR) 50 MG tablet Take 1 tablet (50 mg total) by mouth daily. 07/20/20  Yes Belva Crome, MD  metoprolol succinate (TOPROL-XL) 25 MG 24 hr tablet Take 1 tablet (25 mg total) by mouth daily. 07/20/20  Yes Belva Crome, MD  Multiple Vitamins-Minerals (CENTRUM SILVER 50+WOMEN) TABS Take 1 tablet by mouth daily.   Yes [provider]  pantoprazole (PROTONIX) 40 MG tablet Take 1 tablet (40 mg total) by mouth daily. 12/21/20  Yes Belva Crome, MD  ondansetron (ZOFRAN ODT) 4 MG disintegrating tablet Take 1 tablet (4 mg total) by mouth every 8 (eight) hours as needed. 10/25/20   Nicholes Stairs, MD                                                                                                                                    Past Surgical History Past Surgical History:  Procedure Laterality Date   ABDOMINAL HYSTERECTOMY     APPENDECTOMY     CARDIAC CATHETERIZATION  12/23/2019   CATARACT EXTRACTION     CHOLECYSTECTOMY     CORONARY STENT INTERVENTION N/A 12/23/2019   Procedure: CORONARY STENT INTERVENTION;  Surgeon: Belva Crome, MD;  Location: [facility];  Service: Cardiovascular;  Laterality: N/A;   JOINT REPLACEMENT     2 total knee replacement   LASIK     LEFT HEART CATH AND CORONARY ANGIOGRAPHY N/A 12/23/2019   Procedure: LEFT HEART CATH AND CORONARY ANGIOGRAPHY;  Surgeon: Belva Crome, MD;  Location: [facility];  Service: Cardiovascular;  Laterality: N/A;   REVERSE SHOULDER ARTHROPLASTY Right 04/03/2020   Procedure: REVERSE SHOULDER ARTHROPLASTY;  Surgeon: Nicholes Stairs, MD;  Location: [facility];  Service: Orthopedics;  Laterality:  Right;  2.5 hrs   SHOULDER ARTHROSCOPY WITH ROTATOR CUFF REPAIR AND SUBACROMIAL DECOMPRESSION Left 10/25/2020   Procedure: Left shoulder arthroscopic, extensive debridement, subacromial decompression;  Surgeon: Nicholes Stairs, MD;  Location: [facility];  Service: Orthopedics;  Laterality: Left;  90 mins   THYROIDECTOMY     TONSILLECTOMY     Family History Family History  Problem Relation  Age of Onset   Cancer Mother    Hypertension Mother    Cancer Father    Diabetes Sister    Heart disease Brother    Diabetes Brother    Diabetes Brother    Breast cancer Neg Hx     Social History Social History   Tobacco Use   Smoking status: Former    Types: Cigarettes    Quit date: 02/17/1987    Years since quitting: 34.3   Smokeless tobacco: Never  Vaping Use   Vaping Use: Never used  Substance Use Topics   Alcohol use: No   Drug use: No   Allergies Patient has no known allergies.  Review of Systems Review of Systems  Respiratory:  Negative for cough and shortness of breath.   Cardiovascular:  Positive for chest pain.  Gastrointestinal:  Negative for nausea and vomiting.  Musculoskeletal:  Negative for back pain.  All other systems are reviewed and are negative for acute change except as noted in the HPI  Physical Exam Vital Signs  I have reviewed the triage vital signs BP (!) 147/83    Pulse 70    Temp 98.2 F (36.8 C) (Oral)    Resp 15    Ht 5\' 1"  (1.549 m)    Wt 124.2 kg    SpO2 100%    BMI 51.74 kg/m   Physical Exam Vitals reviewed.  Constitutional:      General: She is not in acute distress.    Appearance: She is well-developed. She is morbidly obese. She is not diaphoretic.  HENT:     Head: Normocephalic and atraumatic.     Nose: Nose normal.  Eyes:     General: No scleral icterus.       Right eye: No discharge.        Left eye: No discharge.     Conjunctiva/sclera: Conjunctivae normal.     Pupils: Pupils are equal, round, and reactive to light.   Cardiovascular:     Rate and Rhythm: Normal rate and regular rhythm.     Heart sounds: No murmur heard.   No friction rub. No gallop.  Pulmonary:     Effort: Pulmonary effort is normal. No respiratory distress.     Breath sounds: Normal breath sounds. No stridor. No rales.  Abdominal:     General: There is no distension.     Palpations: Abdomen is soft.     Tenderness: There is no abdominal tenderness.  Musculoskeletal:        General: No tenderness.     Cervical back: Normal range of motion and neck supple.  Skin:    General: Skin is warm and dry.     Findings: No erythema or rash.  Neurological:     Mental Status: She is alert and oriented to person, place, and time.    ED Results and Treatments Labs (all labs ordered are listed, but only abnormal results are displayed) Labs Reviewed  BASIC METABOLIC PANEL - Abnormal; Notable for the following components:      Result Value   Glucose, Bld 109 (*)    All other components within normal limits  GLUCOSE, CAPILLARY - Abnormal; Notable for the following components:   Glucose-Capillary 114 (*)    All other components within normal limits  RESP PANEL BY RT-PCR (FLU A&B, COVID) ARPGX2  CBC  D-DIMER, QUANTITATIVE  HEMOGLOBIN A1C  COMPREHENSIVE METABOLIC PANEL  MAGNESIUM  CBC WITH DIFFERENTIAL/PLATELET  TROPONIN I (HIGH SENSITIVITY)  TROPONIN I (  HIGH SENSITIVITY)  TROPONIN I (HIGH SENSITIVITY)                                                                                                                         EKG  EKG Interpretation  Date/Time:  Monday June 10 2021 13:14:08 EST Ventricular Rate:  77 PR Interval:  188 QRS Duration: 84 QT Interval:  362 QTC Calculation: 409 R Axis:   24 Text Interpretation: Normal sinus rhythm Cannot rule out Anterior infarct , age undetermined Abnormal ECG No acute changes Confirmed by Addison Lank [phone]) on 06/10/2021 6:30:56 PM       Radiology DG Chest 2 View  Result  Date: 06/10/2021 CLINICAL DATA:  Chest pain EXAM: CHEST - 2 VIEW COMPARISON:  Chest x-ray 12/18/2019 FINDINGS: Heart size and mediastinal contours are within normal limits. No suspicious pulmonary opacities identified. No pleural effusion or pneumothorax visualized. No acute osseous abnormality appreciated. IMPRESSION: No acute intrathoracic process identified. Electronically Signed   By: Ofilia Neas M.D.   On: 06/10/2021 13:59    Pertinent labs & imaging results that were available during my care of the patient were reviewed by me and considered in my medical decision making (see MDM for details).  Medications Ordered in ED Medications  aspirin EC tablet 81 mg (81 mg Oral Not Given 06/10/21 2244)  HYDROcodone-acetaminophen (NORCO/VICODIN) 5-325 MG per tablet 1 tablet (has no administration in time range)  atorvastatin (LIPITOR) tablet 80 mg (has no administration in time range)  losartan (COZAAR) tablet 50 mg (has no administration in time range)  metoprolol succinate (TOPROL-XL) 24 hr tablet 25 mg (has no administration in time range)  pantoprazole (PROTONIX) EC tablet 40 mg (has no administration in time range)  clonazePAM (KLONOPIN) tablet 1 mg (has no administration in time range)  insulin aspart (novoLOG) injection 0-15 Units (0 Units Subcutaneous Not Given 06/10/21 2303)  enoxaparin (LOVENOX) injection 40 mg (40 mg Subcutaneous Given 06/10/21 2303)  acetaminophen (TYLENOL) tablet 650 mg (has no administration in time range)    Or  acetaminophen (TYLENOL) suppository 650 mg (has no administration in time range)  polyethylene glycol (MIRALAX / GLYCOLAX) packet 17 g (has no administration in time range)  ondansetron (ZOFRAN) tablet 4 mg (has no administration in time range)    Or  ondansetron (ZOFRAN) injection 4 mg (has no administration in time range)  nitroGLYCERIN (NITROSTAT) SL tablet 0.4 mg (has no administration in time range)  hydrALAZINE (APRESOLINE) injection 10 mg (has no  administration in time range)  Procedures Procedures  (including critical care time)  Medical Decision Making / ED Course I have reviewed the nursing notes for this encounter and the patient's prior records (if available in EHR or on provided paperwork).  ALIZA MORET was evaluated in Emergency Department on 06/10/2021 for the symptoms described in the history of present illness. She was evaluated in the context of the global COVID-19 pandemic, which necessitated consideration that the patient might be at risk for infection with the SARS-CoV-2 virus that causes COVID-19. Institutional protocols and algorithms that pertain to the evaluation of patients at risk for COVID-19 are in a state of rapid change based on information released by regulatory bodies including the CDC and federal and state organizations. These policies and algorithms were followed during the patient's care in the ED.     Atypical chest pain. EKG without acute ischemic changes or evidence of pericarditis. Serial troponins negative x2. Case discussed with cardiology who recommended admission for ACS rule out.  They will see and evaluate to determine next appropriate work-up.  Low suspicion for pulmonary embolism.  Presentation not classic for aortic dissection or esophageal perforation. On my read of the chest x-ray, there was no evidence suggestive of pneumonia, pneumothorax, pneumomediastinum, pulmonary edema concerning for new or exacerbation of heart failure, abnormal contour of the mediastinum to suggest dissection, and no evidence of acute injuries.  Pertinent labs & imaging results that were available during my care of the patient were reviewed by me and considered in my medical decision making:    Final Clinical Impression(s) / ED Diagnoses Final diagnoses:  Intermittent chest  pain     This chart was dictated using voice recognition software.  Despite best efforts to proofread,  errors can occur which can change the documentation meaning.    Fatima Blank, MD 06/10/21 [id number]

## 2021-06-10 NOTE — ED Provider Notes (Deleted)
Emergency Medicine Provider Triage Evaluation Note  [name] , a 70 y.o. female  was evaluated in triage.  Pt complains of sob. Recent shoulder replacement.  Patient was at her doctor's office this morning and found to have oxygen saturations in the low 90s.  She has been having some bibasilar wheezing, improved with oxygen supplementation and albuterol prior to arrival.  She does have recent URI just before Thanksgiving and has not fully recovered. Patient had productive cough with hemoptysis 2 days ago. C/o chest pain radiating into back   Review of Systems  Positive: sob Negative: fevers  Physical Exam  BP (!) 158/79 (BP Location: Right Arm)    Pulse 76    Temp 98.2 F (36.8 C)    Resp 18    SpO2 97%  Gen:   Awake, no distress   Resp:  Normal effort  MSK:   Moves extremities without difficulty  Other:    Medical Decision Making  Medically screening exam initiated at 1:16 PM.  Appropriate orders placed.  [name] was informed that the remainder of the evaluation will be completed by another provider, this initial triage assessment does not replace that evaluation, and the importance of remaining in the ED until their evaluation is complete.  70 year old female here with shortness of breath, recent surgery.  Given these facts I will not order a D-dimer as it will likely be positive but I have ordered a CTA to rule out pulmonary embolus.  She is markedly tachypneic.  I do not hear wheezing in her chest.  I have low suspicion for dissection.   Margarita Mail, PA-C 06/10/21 1327

## 2021-06-10 NOTE — Assessment & Plan Note (Signed)
.   Resume patients home regimen of oral antihypertensives . Titrate antihypertensive regimen as necessary to achieve adequate BP control . PRN intravenous antihypertensives for excessively elevated blood pressure   

## 2021-06-10 NOTE — Assessment & Plan Note (Signed)
•   Patient is currently chest pain free  Monitoring patient on telemetry  Continue home regimen of antiplatelet therapy, lipid lowering therapy and AV nodal blocking therapy  Please see remainder of assessment and plan above

## 2021-06-10 NOTE — Assessment & Plan Note (Signed)
•   Suspected diagnosis of diastolic heart failure per last outpatient cardiology note  No actual echocardiogram on file, preserved ejection fraction based on estimate during last cardiac catheterization  No clinical evidence of cardiogenic volume overload  Obtaining echocardiogram

## 2021-06-10 NOTE — Assessment & Plan Note (Signed)
Continuing home regimen of daily PPI therapy.  

## 2021-06-10 NOTE — Assessment & Plan Note (Signed)
•   Patient been placed on Accu-Cheks before every meal and nightly with sliding scale insulin . Hemoglobin A1C ordered . Diabetic Diet  

## 2021-06-10 NOTE — Assessment & Plan Note (Addendum)
·   Patient presenting with chest discomfort with both typical and atypical features  Cycling cardiac enzymes, initial troponin unremarkable  EKG reveals no dynamic ST segment change  Chest imaging reveals no evidence of pneumonia, rib fracture  Patient currently chest pain-free  ER provider discussed case with cardiology who will evaluate patient in the morning in consultation  N.p.o. after midnight in case of need for invasive or noninvasive ischemic work-up  Obtaining D-dimer   Providing patient with antiplatelet and statin therapy.  As needed nitroglycerin for further episodes of chest discomfort

## 2021-06-11 ENCOUNTER — Observation Stay (HOSPITAL_COMMUNITY): Payer: PPO

## 2021-06-11 ENCOUNTER — Observation Stay (HOSPITAL_BASED_OUTPATIENT_CLINIC_OR_DEPARTMENT_OTHER): Payer: PPO

## 2021-06-11 DIAGNOSIS — R079 Chest pain, unspecified: Secondary | ICD-10-CM

## 2021-06-11 DIAGNOSIS — I209 Angina pectoris, unspecified: Secondary | ICD-10-CM | POA: Diagnosis not present

## 2021-06-11 DIAGNOSIS — I10 Essential (primary) hypertension: Secondary | ICD-10-CM | POA: Diagnosis not present

## 2021-06-11 LAB — ECHOCARDIOGRAM COMPLETE
Area-P 1/2: 2.99 cm2
Calc EF: 65.7 %
Height: 61 in
S' Lateral: 2.9 cm
Single Plane A2C EF: 67.4 %
Single Plane A4C EF: 64.9 %
Weight: 4380.98 oz

## 2021-06-11 LAB — COMPREHENSIVE METABOLIC PANEL
ALT: 17 U/L (ref 0–44)
AST: 25 U/L (ref 15–41)
Albumin: 3.5 g/dL (ref 3.5–5.0)
Alkaline Phosphatase: 111 U/L (ref 38–126)
Anion gap: 13 (ref 5–15)
BUN: 12 mg/dL (ref 8–23)
CO2: 19 mmol/L — ABNORMAL LOW (ref 22–32)
Calcium: 8.8 mg/dL — ABNORMAL LOW (ref 8.9–10.3)
Chloride: 106 mmol/L (ref 98–111)
Creatinine, Ser: 0.8 mg/dL (ref 0.44–1.00)
GFR, Estimated: >60 mL/min (ref >60)
Glucose, Bld: 149 mg/dL — ABNORMAL HIGH (ref 70–99)
Potassium: 4.1 mmol/L (ref 3.5–5.1)
Sodium: 138 mmol/L (ref 135–145)
Total Bilirubin: 1.5 mg/dL — ABNORMAL HIGH (ref 0.3–1.2)
Total Protein: 6.2 g/dL — ABNORMAL LOW (ref 6.5–8.1)

## 2021-06-11 LAB — CBC WITH DIFFERENTIAL/PLATELET
Abs Immature Granulocytes: 0.03 10*3/uL (ref 0.00–0.07)
Basophils Absolute: 0 10*3/uL (ref 0.0–0.1)
Basophils Relative: 0 %
Eosinophils Absolute: 0.2 10*3/uL (ref 0.0–0.5)
Eosinophils Relative: 3 %
HCT: 42.5 % (ref 36.0–46.0)
Hemoglobin: 14.1 g/dL (ref 12.0–15.0)
Immature Granulocytes: 0 %
Lymphocytes Relative: 38 %
Lymphs Abs: 2.7 10*3/uL (ref 0.7–4.0)
MCH: 30.5 pg (ref 26.0–34.0)
MCHC: 33.2 g/dL (ref 30.0–36.0)
MCV: 91.8 fL (ref 80.0–100.0)
Monocytes Absolute: 0.5 10*3/uL (ref 0.1–1.0)
Monocytes Relative: 7 %
Neutro Abs: 3.5 10*3/uL (ref 1.7–7.7)
Neutrophils Relative %: 52 %
Platelets: 201 10*3/uL (ref 150–400)
RBC: 4.63 MIL/uL (ref 3.87–5.11)
RDW: 13.2 % (ref 11.5–15.5)
WBC: 7 10*3/uL (ref 4.0–10.5)
nRBC: 0 % (ref 0.0–0.2)

## 2021-06-11 LAB — GLUCOSE, CAPILLARY
Glucose-Capillary: 108 mg/dL — ABNORMAL HIGH (ref 70–99)
Glucose-Capillary: 101 mg/dL — ABNORMAL HIGH (ref 70–99)

## 2021-06-11 LAB — HEMOGLOBIN A1C
Hgb A1c MFr Bld: 6.2 % — ABNORMAL HIGH (ref 4.8–5.6)
Mean Plasma Glucose: 131.24 mg/dL

## 2021-06-11 LAB — MAGNESIUM: Magnesium: 2.1 mg/dL (ref 1.7–2.4)

## 2021-06-11 MED ORDER — TECHNETIUM TC 99M TETROFOSMIN IV KIT
32.3000 | PACK | Freq: Once | INTRAVENOUS | Status: AC | PRN
  Administered 2021-06-11: 14:00:00 32.3 via INTRAVENOUS

## 2021-06-11 MED ORDER — REGADENOSON 0.4 MG/5ML IV SOLN
0.4000 mg | Freq: Once | INTRAVENOUS | Status: AC
  Administered 2021-06-11: 14:00:00 0.4 mg via INTRAVENOUS

## 2021-06-11 MED ORDER — REGADENOSON 0.4 MG/5ML IV SOLN
INTRAVENOUS | Status: AC
  Filled 2021-06-11: qty 5

## 2021-06-11 NOTE — Care Management Obs Status (Signed)
[place] NOTIFICATION   Patient Details  Name: [name] MRN: [id number] Date of Birth: 1950-10-27   Medicare Observation Status Notification Given:  Yes    Bethena Roys, RN 06/11/2021, 4:01 PM

## 2021-06-11 NOTE — Progress Notes (Signed)
PROGRESS NOTE    [name]  LSL:[id number] DOB: 15-Jun-1951 DOA: 06/10/2021 PCP: [name], DO   Chief Complain:Chest pain  Brief Narrative:   70 year old female with past medical history of coronary artery disease (cath 12/2019 s/p stent to 99% 1st diag branch lesion), diastolic congestive heart failure (EF 50-55% 12/2019, no echo on file), non insulin dependent diabetes mellitus type 2, gastroesophageal reflux disease, hyperlipidemia, hypertension, hypothyroidism, restless leg syndrome who presented to [facility] emergency department with complaints of chest discomfort.  Upon evaluation in the emergency department troponins were unremarkable with no evidence of EKG changes.  Patient was chest pain-free during her emergency department evaluation.  ER provider discussed case with Dr. Domenic Polite with cardiology and was recommended the patient undergo overnight observation for continued work-up with cardiology consultation in the morning.  Cardiology consulted today.  She is undergoing nuclear stress test.  Assessment & Plan:   Principal Problem:   Chest pain Active Problems:   Essential hypertension   Type 2 diabetes mellitus without complication, without long-term current use of insulin (HCC)   GERD without esophagitis   Chronic diastolic CHF (congestive heart failure) (HCC)   Coronary artery disease of native artery of native heart with stable angina pectoris (HCC)   Mixed diabetic hyperlipidemia associated with type 2 diabetes mellitus ([place])   Chest pain/history of coronary artery disease: Troponins negative.  EKG did not show any symptoms.  Chest imaging without signs of acute findings. Undergoing nuclear stress test today.Echo showed normal EF,no wall motion abnormality. on aspirin ,statin at home.   Chronic diastolic congestive heart failure: Currently euvolemic.   Hypertension: Currently BP stable.  Continue home medications:losartan,metoprolol   Type 2  diabetes: Continue home regimen.  Hemoglobin A1c of   GERD: Continue PPI   History of hyperlipidemia: Continue Lipitor   Morbid obesity: BMI 51.7         DVT prophylaxis:Lovenox Code Status:Full  Patient status:Inpatient  Dispo: The patient is from: home               Anticipated d/c is to: home              Anticipated d/c date is: tomorrow  Consultants: Cardiology  Procedures:Stress test  Antimicrobials:  Anti-infectives (From admission, onward)    None       Subjective: Seen and examined at bedside.Comfortable. Denies any chest pain  Objective: Vitals:   06/11/21 1411 06/11/21 1412 06/11/21 1415 06/11/21 1417  BP: (!) 105/51 (!) 114/52 (!) 115/55 (!) 113/54  Pulse: 76 (!) 101 100 95  Resp:      Temp:      TempSrc:      SpO2:      Weight:      Height:        Intake/Output Summary (Last 24 hours) at 06/11/2021 2020 Last data filed at 06/11/2021 0900 Gross per 24 hour  Intake 60 ml  Output --  Net 60 ml   Filed Weights   06/10/21 2200  Weight: 124.2 kg    Examination:  General exam: Overall comfortable, not in distress,morbidly obese HEENT: PERRL Respiratory system:  no wheezes or crackles  Cardiovascular system: S1 & S2 heard, RRR.  Gastrointestinal system: Abdomen is nondistended, soft and nontender. Central nervous system: Alert and oriented Extremities: No edema, no clubbing ,no cyanosis Skin: No rashes, no ulcers,no icterus      Data Reviewed: I have personally reviewed following labs and imaging studies  CBC: Recent Labs  Lab  06/10/21 1338 06/11/21 0041  WBC 5.4 7.0  NEUTROABS  --  3.5  HGB 13.7 14.1  HCT 43.7 42.5  MCV 94.2 91.8  PLT 203 161   Basic Metabolic Panel: Recent Labs  Lab 06/10/21 1338 06/11/21 0041  NA 140 138  K 4.2 4.1  CL 106 106  CO2 26 19*  GLUCOSE 109* 149*  BUN 10 12  CREATININE 0.76 0.80  CALCIUM 8.9 8.8*  MG  --  2.1   GFR: Estimated Creatinine Clearance: 78.7 mL/min (by C-G formula  based on SCr of 0.8 mg/dL). Liver Function Tests: Recent Labs  Lab 06/11/21 0041  AST 25  ALT 17  ALKPHOS 111  BILITOT 1.5*  PROT 6.2*  ALBUMIN 3.5   No results for input(s): LIPASE, AMYLASE in the last 168 hours. No results for input(s): AMMONIA in the last 168 hours. Coagulation Profile: No results for input(s): INR, PROTIME in the last 168 hours. Cardiac Enzymes: No results for input(s): CKTOTAL, CKMB, CKMBINDEX, TROPONINI in the last 168 hours. BNP (last 3 results) No results for input(s): PROBNP in the last 8760 hours. HbA1C: Recent Labs    06/11/21 0041  HGBA1C 6.2*   CBG: Recent Labs  Lab 06/10/21 2252 06/11/21 0750 06/11/21 1140  GLUCAP 114* 108* 101*   Lipid Profile: No results for input(s): CHOL, HDL, LDLCALC, TRIG, CHOLHDL, LDLDIRECT in the last 72 hours. Thyroid Function Tests: No results for input(s): TSH, T4TOTAL, FREET4, T3FREE, THYROIDAB in the last 72 hours. Anemia Panel: No results for input(s): VITAMINB12, FOLATE, FERRITIN, TIBC, IRON, RETICCTPCT in the last 72 hours. Sepsis Labs: No results for input(s): PROCALCITON, LATICACIDVEN in the last 168 hours.  Recent Results (from the past 240 hour(s))  Resp Panel by RT-PCR (Flu A&B, Covid) Nasopharyngeal Swab     Status: None   Collection Time: 06/10/21  1:38 PM   Specimen: Nasopharyngeal Swab; Nasopharyngeal(NP) swabs in vial transport medium  Result Value Ref Range Status   SARS Coronavirus 2 by RT PCR NEGATIVE NEGATIVE Final    Comment: (NOTE) SARS-CoV-2 target nucleic acids are NOT DETECTED.  The SARS-CoV-2 RNA is generally detectable in upper respiratory specimens during the acute phase of infection. The lowest concentration of SARS-CoV-2 viral copies this assay can detect is 138 copies/mL. A negative result does not preclude SARS-Cov-2 infection and should not be used as the sole basis for treatment or other patient management decisions. A negative result may occur with  improper specimen  collection/handling, submission of specimen other than nasopharyngeal swab, presence of viral mutation(s) within the areas targeted by this assay, and inadequate number of viral copies(<138 copies/mL). A negative result must be combined with clinical observations, patient history, and epidemiological information. The expected result is Negative.  Fact Sheet for Patients:  EntrepreneurPulse.com.au  Fact Sheet for Healthcare Providers:  IncredibleEmployment.be  This test is no t yet approved or cleared by the [place] FDA and  has been authorized for detection and/or diagnosis of SARS-CoV-2 by FDA under an Emergency Use Authorization (EUA). This EUA will remain  in effect (meaning this test can be used) for the duration of the COVID-19 declaration under Section 564(b)(1) of the Act, 21 U.S.C.section 360bbb-3(b)(1), unless the authorization is terminated  or revoked sooner.       Influenza A by PCR NEGATIVE NEGATIVE Final   Influenza B by PCR NEGATIVE NEGATIVE Final    Comment: (NOTE) The Xpert Xpress SARS-CoV-2/FLU/RSV plus assay is intended as an aid in the diagnosis of influenza from Nasopharyngeal  swab specimens and should not be used as a sole basis for treatment. Nasal washings and aspirates are unacceptable for Xpert Xpress SARS-CoV-2/FLU/RSV testing.  Fact Sheet for Patients: EntrepreneurPulse.com.au  Fact Sheet for Healthcare Providers: IncredibleEmployment.be  This test is not yet approved or cleared by the [place] FDA and has been authorized for detection and/or diagnosis of SARS-CoV-2 by FDA under an Emergency Use Authorization (EUA). This EUA will remain in effect (meaning this test can be used) for the duration of the COVID-19 declaration under Section 564(b)(1) of the Act, 21 U.S.C. section 360bbb-3(b)(1), unless the authorization is terminated or revoked.  Performed at [facility], [place] [address], [place], [place] [address]          Radiology Studies: DG Chest 2 View  Result Date: 06/10/2021 CLINICAL DATA:  Chest pain EXAM: CHEST - 2 VIEW COMPARISON:  Chest x-ray 12/18/2019 FINDINGS: Heart size and mediastinal contours are within normal limits. No suspicious pulmonary opacities identified. No pleural effusion or pneumothorax visualized. No acute osseous abnormality appreciated. IMPRESSION: No acute intrathoracic process identified. Electronically Signed   By: Ofilia Neas M.D.   On: 06/10/2021 13:59   ECHOCARDIOGRAM COMPLETE  Result Date: 06/11/2021    ECHOCARDIOGRAM REPORT   Patient Name:   RODA LAUTURE Date of Exam: 06/11/2021 Medical Rec #:  [id number]           Height:       61.0 in Accession #:    [id number]          Weight:       273.8 lb Date of Birth:  1951-01-05            BSA:          2.159 m Patient Age:    72 years            BP:           129/67 mmHg Patient Gender: F                   HR:           75 bpm. Exam Location:  Inpatient Procedure: 2D Echo, 3D Echo, Cardiac Doppler and Color Doppler Indications:    R07.9* Chest pain, unspecified  History:        Patient has no prior history of Echocardiogram examinations.                 CHF, CAD and Previous Myocardial Infarction,                 Signs/Symptoms:Dyspnea and Chest Pain; Risk                 Factors:Hypertension, Diabetes and Dyslipidemia.  Sonographer:    Roseanna Rainbow RDCS Referring Phys: [id number] Victor  Sonographer Comments: Patient is morbidly obese. Image acquisition challenging due to patient body habitus. IMPRESSIONS  1. Left ventricular ejection fraction, by estimation, is 65 to 70%. The left ventricle has normal function. The left ventricle has no regional wall motion abnormalities. Left ventricular diastolic parameters were normal.  2. Right ventricular systolic function is normal. The right ventricular size is normal. Tricuspid regurgitation signal is  inadequate for assessing PA pressure.  3. The mitral valve is grossly normal. No evidence of mitral valve regurgitation. No evidence of mitral stenosis.  4. The aortic valve is grossly normal. Aortic valve regurgitation is not visualized. No aortic stenosis is present.  5. The inferior vena cava  is normal in size with <50% respiratory variability, suggesting right atrial pressure of 8 mmHg. Comparison(s): No prior Echocardiogram. Conclusion(s)/Recommendation(s): Normal biventricular function without evidence of hemodynamically significant valvular heart disease. FINDINGS  Left Ventricle: Left ventricular ejection fraction, by estimation, is 65 to 70%. The left ventricle has normal function. The left ventricle has no regional wall motion abnormalities. The left ventricular internal cavity size was normal in size. There is  borderline left ventricular hypertrophy. Left ventricular diastolic parameters were normal. Right Ventricle: The right ventricular size is normal. No increase in right ventricular wall thickness. Right ventricular systolic function is normal. Tricuspid regurgitation signal is inadequate for assessing PA pressure. Left Atrium: Left atrial size was normal in size. Right Atrium: Right atrial size was normal in size. Pericardium: There is no evidence of pericardial effusion. Presence of epicardial fat layer. Mitral Valve: The mitral valve is grossly normal. No evidence of mitral valve regurgitation. No evidence of mitral valve stenosis. Tricuspid Valve: The tricuspid valve is grossly normal. Tricuspid valve regurgitation is not demonstrated. No evidence of tricuspid stenosis. Aortic Valve: The aortic valve is grossly normal. Aortic valve regurgitation is not visualized. No aortic stenosis is present. Pulmonic Valve: The pulmonic valve was not well visualized. Pulmonic valve regurgitation is not visualized. Aorta: The aortic root, ascending aorta and aortic arch are all structurally normal, with no  evidence of dilitation or obstruction. Venous: The inferior vena cava is normal in size with less than 50% respiratory variability, suggesting right atrial pressure of 8 mmHg. IAS/Shunts: The atrial septum is grossly normal.  LEFT VENTRICLE PLAX 2D LVIDd:         4.80 cm     Diastology LVIDs:         2.90 cm     LV e' medial:    5.00 cm/s LV PW:         1.20 cm     LV E/e' medial:  12.7 LV IVS:        1.00 cm     LV e' lateral:   6.53 cm/s LVOT diam:     2.10 cm     LV E/e' lateral: 9.7 LV SV:         74 LV SV Index:   34 LVOT Area:     3.46 cm                             3D Volume EF: LV Volumes (MOD)           3D EF:        67 % LV vol d, MOD A2C: 47.6 ml LV EDV:       104 ml LV vol d, MOD A4C: 70.3 ml LV ESV:       35 ml LV vol s, MOD A2C: 15.5 ml LV SV:        69 ml LV vol s, MOD A4C: 24.7 ml LV SV MOD A2C:     32.1 ml LV SV MOD A4C:     70.3 ml LV SV MOD BP:      38.4 ml RIGHT VENTRICLE             IVC RV S prime:     12.60 cm/s  IVC diam: 1.80 cm TAPSE (M-mode): 1.6 cm LEFT ATRIUM           Index        RIGHT ATRIUM  Index LA diam:      3.60 cm 1.67 cm/m   RA Area:     13.80 cm LA Vol (A2C): 25.7 ml 11.91 ml/m  RA Volume:   32.60 ml  15.10 ml/m LA Vol (A4C): 18.9 ml 8.76 ml/m  AORTIC VALVE LVOT Vmax:   104.00 cm/s LVOT Vmean:  75.000 cm/s LVOT VTI:    0.214 m  AORTA Ao Root diam: 3.30 cm Ao Asc diam:  3.30 cm MITRAL VALVE MV Area (PHT): 2.99 cm    SHUNTS MV Decel Time: 254 msec    Systemic VTI:  0.21 m MV E velocity: 63.40 cm/s  Systemic Diam: 2.10 cm MV A velocity: 97.40 cm/s MV E/A ratio:  0.65 Buford Dresser MD Electronically signed by Buford Dresser MD Signature Date/Time: 06/11/2021/1:06:59 PM    Final         Scheduled Meds:  aspirin EC  81 mg Oral Daily   atorvastatin  80 mg Oral Daily   enoxaparin (LOVENOX) injection  40 mg Subcutaneous Q24H   insulin aspart  0-15 Units Subcutaneous TID AC & HS   losartan  50 mg Oral Daily   metoprolol succinate  25 mg Oral  Daily   pantoprazole  40 mg Oral Daily   regadenoson       Continuous Infusions:   LOS: 0 days    Time spent: More than 50% of that time was spent in counseling and/or coordination of care.      Shelly Coss, MD [facility] P12/20/2022, 8:20 PM

## 2021-06-11 NOTE — Progress Notes (Signed)
° °  [name] presented for a nuclear stress test today.  No immediate complications.  Stress imaging is pending at this time.  Preliminary EKG findings may be listed in the chart, but the stress test result will not be finalized until perfusion imaging is complete.  2 day study, [place] to read.  Rosaria Ferries, PA-C 06/11/2021, 6:44 PM

## 2021-06-11 NOTE — Progress Notes (Signed)
°  Echocardiogram 2D Echocardiogram has been performed.  Bobbye Charleston 06/11/2021, 10:10 AM

## 2021-06-11 NOTE — Discharge Summary (Addendum)
Physician Discharge Summary  DONNELLA MORFORD TGY:[id number] DOB: 1950/09/27 DOA: 06/10/2021  PCP: [name], DO  Admit date: 06/10/2021 Discharge date: 06/12/2021  Admitted From: Home Disposition:  Home  Discharge Condition:Stable CODE STATUS:FULL Diet recommendation: Heart Healthy  Brief/Interim Summary:  HPI:     70 year old female with past medical history of coronary artery disease (cath 12/2019 s/p stent to 99% 1st diag branch lesion), diastolic congestive heart failure (EF 50-55% 12/2019, no echo on file), non insulin dependent diabetes mellitus type 2, gastroesophageal reflux disease, hyperlipidemia, hypertension, hypothyroidism, restless leg syndrome who presented to [facility] emergency department with complaints of chest discomfort.  Upon evaluation in the emergency department troponins were unremarkable with no evidence of EKG changes.  Patient was chest pain-free during her emergency department evaluation.  ER provider discussed case with Dr. Domenic Polite with cardiology and was recommended the patient undergo overnight observation for continued work-up with cardiology consultation in the morning.  Cardiology consulted today.  She underwent nuclear stress test without evidence of  reversible ischemia.  She is medically stable for discharge home today.  Follow-up problems were addressed during her hospitalization:  Chest pain/history of coronary artery disease: Troponins negative.  EKG did not show any symptoms.  Chest imaging without signs of acute findings. Underwent nuclear stress test today which did not show any evidence of reversible ischemia.  Echo showed normal EF, no wall motion abnormality Continue aspirin statin at home.  Chest pain has resolved  Chronic diastolic congestive heart failure: Currently euvolemic.  Hypertension: Currently BP stable.  Continue home medications:losartan,metoprolol.  Added Imdur  Prediabetes: Diet controlled  Hemoglobin A1c of  6.2  GERD: Continue PPI  History of hyperlipidemia: Continue Lipitor  Morbid obesity: BMI 51.7        Discharge Diagnoses:  Principal Problem:   Chest pain Active Problems:   Essential hypertension   Type 2 diabetes mellitus without complication, without long-term current use of insulin (HCC)   GERD without esophagitis   Chronic diastolic CHF (congestive heart failure) (HCC)   Coronary artery disease of native artery of native heart with stable angina pectoris ([place])   Mixed diabetic hyperlipidemia associated with type 2 diabetes mellitus [facility])    Discharge Instructions  Discharge Instructions     Diet - low sodium heart healthy   Complete by: As directed    Discharge instructions   Complete by: As directed    1)Please take your medications as instructed 2)Follow up with your PCP in 1-2 weeks   Increase activity slowly   Complete by: As directed       Allergies as of 06/12/2021   No Known Allergies      Medication List     TAKE these medications    aspirin 81 MG EC tablet Take 81 mg by mouth daily.   atorvastatin 80 MG tablet Commonly known as: LIPITOR Take 1 tablet (80 mg total) by mouth daily.   Centrum Silver 50+Women Tabs Take 1 tablet by mouth daily.   clonazePAM 1 MG tablet Commonly known as: KLONOPIN Take 1 mg by mouth at bedtime as needed (restless leg).   HYDROcodone-acetaminophen 5-325 MG tablet Commonly known as: Norco Take 1 tablet by mouth every 6 (six) hours as needed for moderate pain.   isosorbide mononitrate 30 MG 24 hr tablet Commonly known as: IMDUR Take 1 tablet (30 mg total) by mouth daily.   losartan 50 MG tablet Commonly known as: COZAAR Take 1 tablet (50 mg total) by mouth daily.  metoprolol succinate 25 MG 24 hr tablet Commonly known as: TOPROL-XL Take 1 tablet (25 mg total) by mouth daily.   ondansetron 4 MG disintegrating tablet Commonly known as: Zofran ODT Take 1 tablet (4 mg total) by mouth every 8  (eight) hours as needed.   pantoprazole 40 MG tablet Commonly known as: PROTONIX Take 1 tablet (40 mg total) by mouth daily.        Follow-up Information     [name], DO. Schedule an appointment as soon as possible for a visit in 1 week(s).   Specialty: Family Medicine Contact information: [address] [place] [address] [phone]                No Known Allergies  Consultations: Cardiology   Procedures/Studies: DG Chest 2 View  Result Date: 06/10/2021 CLINICAL DATA:  Chest pain EXAM: CHEST - 2 VIEW COMPARISON:  Chest x-ray 12/18/2019 FINDINGS: Heart size and mediastinal contours are within normal limits. No suspicious pulmonary opacities identified. No pleural effusion or pneumothorax visualized. No acute osseous abnormality appreciated. IMPRESSION: No acute intrathoracic process identified. Electronically Signed   By: Ofilia Neas M.D.   On: 06/10/2021 13:59   NM Myocar Multi W/Spect W/Wall Motion / EF  Result Date: 06/12/2021 CLINICAL DATA:  70 year old female with chest pain. EXAM: MYOCARDIAL IMAGING WITH SPECT (REST AND PHARMACOLOGIC-STRESS - 2 DAY PROTOCOL) GATED LEFT VENTRICULAR WALL MOTION STUDY LEFT VENTRICULAR EJECTION FRACTION TECHNIQUE: Standard myocardial SPECT imaging was performed after resting intravenous injection of 30 mCi Tc-18m tetrofosmin. Subsequently, on a second day, intravenous infusion of Lexiscan was performed under the supervision of the Cardiology staff. At peak effect of the drug, 30 mCi Tc-43m tetrofosmin was injected intravenously and standard myocardial SPECT imaging was performed. Quantitative gated imaging was also performed to evaluate left ventricular wall motion, and estimate left ventricular ejection fraction. COMPARISON:  Chest radiograph 06/10/2021 FINDINGS: Perfusion: There is moderate decreased perfusion to the anterior and anterolateral wall involving the apical and mid segment. This decreased  perfusion is more prominent on the rest imaging than the stress imaging. Findings indicate a prior scar/infarction. No evidence reversible ischemia. Wall Motion: Normal left ventricular wall motion. No left ventricular dilation. Left Ventricular Ejection Fraction: Greater than 70 % End diastolic volume 67 ml End systolic volume 19 ml IMPRESSION: 1. No reversible ischemia.  Suspect anterolateral infarct. 2. Normal left ventricular wall motion. 3. Left ventricular ejection fraction greater than 70% 4. Non invasive risk stratification*: Low *2012 Appropriate Use Criteria for Coronary Revascularization Focused Update: J Am Coll Cardiol. 6063;01(6):010-932. http://content.airportbarriers.com.aspx?articleid=[email or web address] Electronically Signed   By: Suzy Bouchard M.D.   On: 06/12/2021 10:04   ECHOCARDIOGRAM COMPLETE  Result Date: 06/11/2021    ECHOCARDIOGRAM REPORT   Patient Name:   SADAKO CEGIELSKI Date of Exam: 06/11/2021 Medical Rec #:  [id number]           Height:       61.0 in Accession #:    [id number]          Weight:       273.8 lb Date of Birth:  04/10/1951            BSA:          2.159 m Patient Age:    86 years            BP:           129/67 mmHg Patient Gender: F  HR:           75 bpm. Exam Location:  Inpatient Procedure: 2D Echo, 3D Echo, Cardiac Doppler and Color Doppler Indications:    R07.9* Chest pain, unspecified  History:        Patient has no prior history of Echocardiogram examinations.                 CHF, CAD and Previous Myocardial Infarction,                 Signs/Symptoms:Dyspnea and Chest Pain; Risk                 Factors:Hypertension, Diabetes and Dyslipidemia.  Sonographer:    Roseanna Rainbow RDCS Referring Phys: [id number] [place]  Sonographer Comments: Patient is morbidly obese. Image acquisition challenging due to patient body habitus. IMPRESSIONS  1. Left ventricular ejection fraction, by estimation, is 65 to 70%. The left ventricle has normal function. The left  ventricle has no regional wall motion abnormalities. Left ventricular diastolic parameters were normal.  2. Right ventricular systolic function is normal. The right ventricular size is normal. Tricuspid regurgitation signal is inadequate for assessing PA pressure.  3. The mitral valve is grossly normal. No evidence of mitral valve regurgitation. No evidence of mitral stenosis.  4. The aortic valve is grossly normal. Aortic valve regurgitation is not visualized. No aortic stenosis is present.  5. The inferior vena cava is normal in size with <50% respiratory variability, suggesting right atrial pressure of 8 mmHg. Comparison(s): No prior Echocardiogram. Conclusion(s)/Recommendation(s): Normal biventricular function without evidence of hemodynamically significant valvular heart disease. FINDINGS  Left Ventricle: Left ventricular ejection fraction, by estimation, is 65 to 70%. The left ventricle has normal function. The left ventricle has no regional wall motion abnormalities. The left ventricular internal cavity size was normal in size. There is  borderline left ventricular hypertrophy. Left ventricular diastolic parameters were normal. Right Ventricle: The right ventricular size is normal. No increase in right ventricular wall thickness. Right ventricular systolic function is normal. Tricuspid regurgitation signal is inadequate for assessing PA pressure. Left Atrium: Left atrial size was normal in size. Right Atrium: Right atrial size was normal in size. Pericardium: There is no evidence of pericardial effusion. Presence of epicardial fat layer. Mitral Valve: The mitral valve is grossly normal. No evidence of mitral valve regurgitation. No evidence of mitral valve stenosis. Tricuspid Valve: The tricuspid valve is grossly normal. Tricuspid valve regurgitation is not demonstrated. No evidence of tricuspid stenosis. Aortic Valve: The aortic valve is grossly normal. Aortic valve regurgitation is not visualized. No aortic  stenosis is present. Pulmonic Valve: The pulmonic valve was not well visualized. Pulmonic valve regurgitation is not visualized. Aorta: The aortic root, ascending aorta and aortic arch are all structurally normal, with no evidence of dilitation or obstruction. Venous: The inferior vena cava is normal in size with less than 50% respiratory variability, suggesting right atrial pressure of 8 mmHg. IAS/Shunts: The atrial septum is grossly normal.  LEFT VENTRICLE PLAX 2D LVIDd:         4.80 cm     Diastology LVIDs:         2.90 cm     LV e' medial:    5.00 cm/s LV PW:         1.20 cm     LV E/e' medial:  12.7 LV IVS:        1.00 cm     LV e' lateral:   6.53 cm/s  LVOT diam:     2.10 cm     LV E/e' lateral: 9.7 LV SV:         74 LV SV Index:   34 LVOT Area:     3.46 cm                             3D Volume EF: LV Volumes (MOD)           3D EF:        67 % LV vol d, MOD A2C: 47.6 ml LV EDV:       104 ml LV vol d, MOD A4C: 70.3 ml LV ESV:       35 ml LV vol s, MOD A2C: 15.5 ml LV SV:        69 ml LV vol s, MOD A4C: 24.7 ml LV SV MOD A2C:     32.1 ml LV SV MOD A4C:     70.3 ml LV SV MOD BP:      38.4 ml RIGHT VENTRICLE             IVC RV S prime:     12.60 cm/s  IVC diam: 1.80 cm TAPSE (M-mode): 1.6 cm LEFT ATRIUM           Index        RIGHT ATRIUM           Index LA diam:      3.60 cm 1.67 cm/m   RA Area:     13.80 cm LA Vol (A2C): 25.7 ml 11.91 ml/m  RA Volume:   32.60 ml  15.10 ml/m LA Vol (A4C): 18.9 ml 8.76 ml/m  AORTIC VALVE LVOT Vmax:   104.00 cm/s LVOT Vmean:  75.000 cm/s LVOT VTI:    0.214 m  AORTA Ao Root diam: 3.30 cm Ao Asc diam:  3.30 cm MITRAL VALVE MV Area (PHT): 2.99 cm    SHUNTS MV Decel Time: 254 msec    Systemic VTI:  0.21 m MV E velocity: 63.40 cm/s  Systemic Diam: 2.10 cm MV A velocity: 97.40 cm/s MV E/A ratio:  0.65 Buford Dresser MD Electronically signed by Buford Dresser MD Signature Date/Time: 06/11/2021/1:06:59 PM    Final       Subjective: Patient seen and examined at the  bedside this morning.  Hemodynamically stable for discharge today  Discharge Exam: Vitals:   06/12/21 0548 06/12/21 0944  BP: 123/67 (!) 150/96  Pulse: 75 81  Resp: 18   Temp: 98.5 F (36.9 C)   SpO2: 97%    Vitals:   06/11/21 1417 06/11/21 2100 06/12/21 0548 06/12/21 0944  BP: (!) 113/54 111/68 123/67 (!) 150/96  Pulse: 95 75 75 81  Resp:  18 18   Temp:  97.9 F (36.6 C) 98.5 F (36.9 C)   TempSrc:  Oral Oral   SpO2:  99% 97%   Weight:      Height:        General: Pt is alert, awake, not in acute distress, obese Cardiovascular: RRR, S1/S2 +, no rubs, no gallops Respiratory: CTA bilaterally, no wheezing, no rhonchi Abdominal: Soft, NT, ND, bowel sounds + Extremities: no edema, no cyanosis    The results of significant diagnostics from this hospitalization (including imaging, microbiology, ancillary and laboratory) are listed below for reference.     Microbiology: Recent Results (from the past 240 hour(s))  Resp Panel by RT-PCR (Flu A&B, Covid)  Nasopharyngeal Swab     Status: None   Collection Time: 06/10/21  1:38 PM   Specimen: Nasopharyngeal Swab; Nasopharyngeal(NP) swabs in vial transport medium  Result Value Ref Range Status   SARS Coronavirus 2 by RT PCR NEGATIVE NEGATIVE Final    Comment: (NOTE) SARS-CoV-2 target nucleic acids are NOT DETECTED.  The SARS-CoV-2 RNA is generally detectable in upper respiratory specimens during the acute phase of infection. The lowest concentration of SARS-CoV-2 viral copies this assay can detect is 138 copies/mL. A negative result does not preclude SARS-Cov-2 infection and should not be used as the sole basis for treatment or other patient management decisions. A negative result may occur with  improper specimen collection/handling, submission of specimen other than nasopharyngeal swab, presence of viral mutation(s) within the areas targeted by this assay, and inadequate number of viral copies(<138 copies/mL). A negative  result must be combined with clinical observations, patient history, and epidemiological information. The expected result is Negative.  Fact Sheet for Patients:  EntrepreneurPulse.com.au  Fact Sheet for Healthcare Providers:  IncredibleEmployment.be  This test is no t yet approved or cleared by the [place] FDA and  has been authorized for detection and/or diagnosis of SARS-CoV-2 by FDA under an Emergency Use Authorization (EUA). This EUA will remain  in effect (meaning this test can be used) for the duration of the COVID-19 declaration under Section 564(b)(1) of the Act, 21 U.S.C.section 360bbb-3(b)(1), unless the authorization is terminated  or revoked sooner.       Influenza A by PCR NEGATIVE NEGATIVE Final   Influenza B by PCR NEGATIVE NEGATIVE Final    Comment: (NOTE) The Xpert Xpress SARS-CoV-2/FLU/RSV plus assay is intended as an aid in the diagnosis of influenza from Nasopharyngeal swab specimens and should not be used as a sole basis for treatment. Nasal washings and aspirates are unacceptable for Xpert Xpress SARS-CoV-2/FLU/RSV testing.  Fact Sheet for Patients: EntrepreneurPulse.com.au  Fact Sheet for Healthcare Providers: IncredibleEmployment.be  This test is not yet approved or cleared by the [place] FDA and has been authorized for detection and/or diagnosis of SARS-CoV-2 by FDA under an Emergency Use Authorization (EUA). This EUA will remain in effect (meaning this test can be used) for the duration of the COVID-19 declaration under Section 564(b)(1) of the Act, 21 U.S.C. section 360bbb-3(b)(1), unless the authorization is terminated or revoked.  Performed at [facility], [place] [address], [place], [place] [address]      Labs: BNP (last 3 results) No results for input(s): BNP in the last 8760 hours. Basic Metabolic Panel: Recent Labs  Lab 06/10/21 1338  06/11/21 0041  NA 140 138  K 4.2 4.1  CL 106 106  CO2 26 19*  GLUCOSE 109* 149*  BUN 10 12  CREATININE 0.76 0.80  CALCIUM 8.9 8.8*  MG  --  2.1   Liver Function Tests: Recent Labs  Lab 06/11/21 0041  AST 25  ALT 17  ALKPHOS 111  BILITOT 1.5*  PROT 6.2*  ALBUMIN 3.5   No results for input(s): LIPASE, AMYLASE in the last 168 hours. No results for input(s): AMMONIA in the last 168 hours. CBC: Recent Labs  Lab 06/10/21 1338 06/11/21 0041  WBC 5.4 7.0  NEUTROABS  --  3.5  HGB 13.7 14.1  HCT 43.7 42.5  MCV 94.2 91.8  PLT 203 201   Cardiac Enzymes: No results for input(s): CKTOTAL, CKMB, CKMBINDEX, TROPONINI in the last 168 hours. BNP: Invalid input(s): POCBNP CBG: Recent Labs  Lab  06/10/21 2252 06/11/21 0750 06/11/21 1140 06/11/21 2158 06/12/21 0749  GLUCAP 114* 108* 101* 151* 125*   D-Dimer Recent Labs    06/10/21 2054  DDIMER 0.44   Hgb A1c Recent Labs    06/11/21 0041  HGBA1C 6.2*   Lipid Profile No results for input(s): CHOL, HDL, LDLCALC, TRIG, CHOLHDL, LDLDIRECT in the last 72 hours. Thyroid function studies No results for input(s): TSH, T4TOTAL, T3FREE, THYROIDAB in the last 72 hours.  Invalid input(s): FREET3 Anemia work up No results for input(s): VITAMINB12, FOLATE, FERRITIN, TIBC, IRON, RETICCTPCT in the last 72 hours. Urinalysis    Component Value Date/Time   COLORURINE YELLOW 09/20/2015 1313   APPEARANCEUR CLOUDY (A) 09/20/2015 1313   LABSPEC 1.024 09/20/2015 1313   PHURINE 7.5 09/20/2015 1313   GLUCOSEU NEGATIVE 09/20/2015 1313   HGBUR NEGATIVE 09/20/2015 1313   BILIRUBINUR NEGATIVE 09/20/2015 1313   KETONESUR NEGATIVE 09/20/2015 1313   PROTEINUR NEGATIVE 09/20/2015 1313   NITRITE NEGATIVE 09/20/2015 1313   LEUKOCYTESUR SMALL (A) 09/20/2015 1313   Sepsis Labs Invalid input(s): PROCALCITONIN,  WBC,  LACTICIDVEN Microbiology Recent Results (from the past 240 hour(s))  Resp Panel by RT-PCR (Flu A&B, Covid) Nasopharyngeal  Swab     Status: None   Collection Time: 06/10/21  1:38 PM   Specimen: Nasopharyngeal Swab; Nasopharyngeal(NP) swabs in vial transport medium  Result Value Ref Range Status   SARS Coronavirus 2 by RT PCR NEGATIVE NEGATIVE Final    Comment: (NOTE) SARS-CoV-2 target nucleic acids are NOT DETECTED.  The SARS-CoV-2 RNA is generally detectable in upper respiratory specimens during the acute phase of infection. The lowest concentration of SARS-CoV-2 viral copies this assay can detect is 138 copies/mL. A negative result does not preclude SARS-Cov-2 infection and should not be used as the sole basis for treatment or other patient management decisions. A negative result may occur with  improper specimen collection/handling, submission of specimen other than nasopharyngeal swab, presence of viral mutation(s) within the areas targeted by this assay, and inadequate number of viral copies(<138 copies/mL). A negative result must be combined with clinical observations, patient history, and epidemiological information. The expected result is Negative.  Fact Sheet for Patients:  EntrepreneurPulse.com.au  Fact Sheet for Healthcare Providers:  IncredibleEmployment.be  This test is no t yet approved or cleared by the [place] FDA and  has been authorized for detection and/or diagnosis of SARS-CoV-2 by FDA under an Emergency Use Authorization (EUA). This EUA will remain  in effect (meaning this test can be used) for the duration of the COVID-19 declaration under Section 564(b)(1) of the Act, 21 U.S.C.section 360bbb-3(b)(1), unless the authorization is terminated  or revoked sooner.       Influenza A by PCR NEGATIVE NEGATIVE Final   Influenza B by PCR NEGATIVE NEGATIVE Final    Comment: (NOTE) The Xpert Xpress SARS-CoV-2/FLU/RSV plus assay is intended as an aid in the diagnosis of influenza from Nasopharyngeal swab specimens and should not be used as a sole  basis for treatment. Nasal washings and aspirates are unacceptable for Xpert Xpress SARS-CoV-2/FLU/RSV testing.  Fact Sheet for Patients: EntrepreneurPulse.com.au  Fact Sheet for Healthcare Providers: IncredibleEmployment.be  This test is not yet approved or cleared by the [place] FDA and has been authorized for detection and/or diagnosis of SARS-CoV-2 by FDA under an Emergency Use Authorization (EUA). This EUA will remain in effect (meaning this test can be used) for the duration of the COVID-19 declaration under Section 564(b)(1) of the Act, 21 U.S.C. section 360bbb-3(b)(1),  unless the authorization is terminated or revoked.  Performed at [facility], [place] [address], [place], [place] [address]     Please note: You were cared for by a hospitalist during your hospital stay. Once you are discharged, your primary care physician will handle any further medical issues. Please note that NO REFILLS for any discharge medications will be authorized once you are discharged, as it is imperative that you return to your primary care physician (or establish a relationship with a primary care physician if you do not have one) for your post hospital discharge needs so that they can reassess your need for medications and monitor your lab values.    Time coordinating discharge: 40 minutes  SIGNED:   Shelly Coss, MD  [facility] 06/12/2021, 10:47 AM Pager [phone]  If 7PM-7AM, please contact night-coverage [email or web address] Password [email or web address]

## 2021-06-11 NOTE — Consult Note (Signed)
Cardiology Consultation:   Patient ID: [name] MRN: [id number]; DOB: [birth year]  Admit date: 06/10/2021 Date of Consult: 06/11/2021  PCP:  [name], DO   [facility] Providers Cardiologist:  Sinclair Grooms, MD     Patient Profile:   [name] is a 70 y.o. female with a hx of HTN, HLD, obesity, DM, CAD s/p stenting of 1st diag who is being seen 06/11/2021 for the evaluation of chest pain at the request of Dr. Tawanna Solo.  History of Present Illness:   [name] is a 70 year old female with past medical history noted above.  She has been followed by Dr. Tamala Julian as an outpatient.  She has known CAD with prior stenting to 1st diag 12/2019, 50% mid LAD disease treated medically.  She was initially placed on Brilinta but developed dyspnea and ultimately transitioned over to Plavix.  Unfortunately had a late femoral bleed after having unsuccessful approach from small radial artery.  She had bruising while on aspirin and Plavix and aspirin was eventually discontinued with her bruising stopped.  She was last seen in the office on 02/18/2021 with Dr. Tamala Julian.  Denied any chest pain.  States she had chronic dyspnea on exertion.  Noted to be relatively sedentary.  It was recommended that she switch to aspirin 81 mg daily and discontinue Plavix, continued on Lipitor as well as metoprolol and Crestor.  Presented to the ED on 12/19 with complaints of chest discomfort.  States over the past week she has had intermittent episodes of centralized chest discomfort that lasts for 30 to 40 minutes at a time.  States episodes are present with both rest and activity.  Improve with rest and slow deep breathing.  Denies any shortness of breath, dyspnea on exertion, orthopnea or PND.  No radiation into her arms jaw or back.  States she became concerned as the symptoms are somewhat reminiscent to when she was admitted and underwent cardiac cath 12/2019 with DES placed.   Labs in the ED showed  sodium 140, potassium 4.2, creatinine 0.76, high-sensitivity troponin 2>>2>>3, WBC 5.4, hemoglobin 13.7, Ddimer 0.4. EKG showed sinus rhythm, 77 bpm, TWI in anterolateral leads.  Chest x-ray negative.  She was admitted to internal medicine for further management.  Cardiology consulted for further recommendations.   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  Discussed as is on low dose, would be reasonable to discontinue and recheck in 6-8 weeks which she would like to do. Last Assessment & Plan:  States was started after a partial thyroidectomy but does not recall any abnormal lab value and no symptoms.  Discussed as    Lumbar spondylosis 07/09/2017   Memory disorder 10/30/2015   Obesity, morbid, BMI 40.0-49.9 ([place]) 06/02/2014   Osteoporosis 05/02/2013   Overview:  DEXA 11/14 DEXA 11/14   Restless legs syndrome 05/25/2012   Last Assessment & Plan:  patient reports using clonazepam about once weekly .  Discussed  nonpharmacologic tips for treating RLS.  States other medicatiosn she had treid had not been effective (does not remember name) Will refill for now.  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   URI, acute 09/20/2015   Vitamin B 12 deficiency    Vitamin D deficiency 05/25/2012    Past Surgical History:  Procedure Laterality Date   ABDOMINAL HYSTERECTOMY      APPENDECTOMY     CARDIAC CATHETERIZATION  12/23/2019   CATARACT EXTRACTION     CHOLECYSTECTOMY     CORONARY STENT INTERVENTION N/A 12/23/2019   Procedure: CORONARY STENT INTERVENTION;  Surgeon: Belva Crome, MD;  Location: [facility];  Service: Cardiovascular;  Laterality: N/A;   JOINT REPLACEMENT     2 total knee replacement   LASIK     LEFT HEART CATH AND CORONARY ANGIOGRAPHY N/A 12/23/2019   Procedure: LEFT HEART CATH AND CORONARY ANGIOGRAPHY;  Surgeon: Belva Crome, MD;  Location: [facility];  Service: Cardiovascular;  Laterality: N/A;   REVERSE SHOULDER ARTHROPLASTY Right 04/03/2020   Procedure: REVERSE SHOULDER ARTHROPLASTY;  Surgeon: Nicholes Stairs, MD;  Location: [facility];  Service: Orthopedics;  Laterality: Right;  2.5 hrs   SHOULDER ARTHROSCOPY WITH ROTATOR CUFF REPAIR AND SUBACROMIAL DECOMPRESSION Left 10/25/2020   Procedure: Left shoulder arthroscopic, extensive debridement, subacromial decompression;  Surgeon: Nicholes Stairs, MD;  Location: [facility];  Service: Orthopedics;  Laterality: Left;  90 mins   THYROIDECTOMY     TONSILLECTOMY       Home Medications:  Prior to Admission medications   Medication Sig Start Date End Date Taking? Authorizing Provider  aspirin 81 MG EC tablet Take 81 mg by mouth daily.   Yes [provider]  atorvastatin (LIPITOR) 80 MG tablet Take 1 tablet (80 mg total) by mouth daily. 07/20/20  Yes Belva Crome, MD  clonazePAM (KLONOPIN) 1 MG tablet Take 1 mg by mouth at bedtime as needed (restless leg).  03/05/20  Yes [provider]  HYDROcodone-acetaminophen (NORCO) 5-325 MG tablet Take 1 tablet by mouth every 6 (six) hours as needed for moderate pain. 10/25/20  Yes Nicholes Stairs, MD  losartan (COZAAR) 50 MG tablet Take 1 tablet (50 mg total) by mouth daily. 07/20/20  Yes Belva Crome, MD  metoprolol succinate (TOPROL-XL) 25 MG 24 hr tablet Take 1 tablet (25 mg total) by mouth daily. 07/20/20  Yes Belva Crome, MD  Multiple Vitamins-Minerals (CENTRUM SILVER 50+WOMEN) TABS Take 1 tablet by mouth daily.   Yes [provider]  pantoprazole (PROTONIX) 40 MG tablet Take 1 tablet (40 mg total) by mouth daily. 12/21/20  Yes Belva Crome, MD  ondansetron (ZOFRAN ODT) 4 MG disintegrating tablet Take 1 tablet (4 mg total) by mouth every 8 (eight) hours as needed. 10/25/20   Nicholes Stairs, MD    Inpatient Medications: Scheduled Meds:  aspirin EC  81 mg Oral Daily   atorvastatin  80 mg Oral Daily   enoxaparin (LOVENOX) injection  40 mg Subcutaneous Q24H   insulin aspart  0-15 Units Subcutaneous TID AC & HS   losartan  50 mg Oral Daily   metoprolol succinate  25 mg Oral Daily   pantoprazole  40 mg Oral Daily   Continuous Infusions:  PRN Meds: acetaminophen **OR** acetaminophen, clonazePAM, hydrALAZINE,  HYDROcodone-acetaminophen, nitroGLYCERIN, ondansetron **OR** ondansetron (ZOFRAN) IV, polyethylene glycol  Allergies:   No Known Allergies  Social History:   Social History   Socioeconomic History   Marital status: Divorced    Spouse name: Not on file   Number of children: Not on file   Years of education: Not on file   Highest education level: Not on file  Occupational History   Not on file  Tobacco Use   Smoking status: Former    Types: Cigarettes    Quit date: 02/17/1987    Years since quitting: 34.3   Smokeless tobacco: Never  Vaping Use   Vaping Use: Never used  Substance and Sexual Activity   Alcohol use: No   Drug use: No   Sexual activity: Not Currently    Birth control/protection: Post-menopausal, Surgical    Comment: Hysterectomy  Other Topics Concern   Not on file  Social History Narrative   Lives at home alone   Right-handed   Social Determinants of Health   Financial Resource Strain: Not on file  Food Insecurity: Not on file  Transportation Needs: Not on file  Physical Activity: Not on file  Stress: Not on file  Social Connections: Not on  file  Intimate Partner Violence: Not on file    Family History:    Family History  Problem Relation Age of Onset   Cancer Mother    Hypertension Mother    Cancer Father    Diabetes Sister    Heart disease Brother    Diabetes Brother    Diabetes Brother    Breast cancer Neg Hx      ROS:  Please see the history of present illness.   All other ROS reviewed and negative.     Physical Exam/Data:   Vitals:   06/10/21 2035 06/10/21 2200 06/11/21 0629 06/11/21 0915  BP: (!) 147/83  140/72 129/67  Pulse: 70  72 81  Resp: 15  16   Temp:  98.2 F (36.8 C) 98.2 F (36.8 C)   TempSrc:  Oral Oral   SpO2: 100%  99% 100%  Weight:  124.2 kg    Height:  5\' 1"  (1.549 m)     No intake or output data in the 24 hours ending 06/11/21 1012 Last 3 Weights 06/10/2021 02/18/2021 10/25/2020  Weight (lbs) 273 lb 13 oz 273 lb 3.2 oz 258 lb  Weight (kg) 124.2 kg 123.923 kg 117.028 kg     Body mass index is 51.74 kg/m.  General:  Well nourished, well developed, in no acute distress HEENT: normal Neck: no JVD Vascular: No carotid bruits; Distal pulses 2+ bilaterally Cardiac:  normal S1, S2; RRR; no murmur  Lungs:  clear to auscultation bilaterally, no wheezing, rhonchi or rales  Abd: soft, nontender, no hepatomegaly  Ext: no edema Musculoskeletal:  No deformities, BUE and BLE strength normal and equal Skin: warm and dry  Neuro:  CNs 2-12 intact, no focal abnormalities noted Psych:  Normal affect   EKG:  The EKG was personally reviewed and demonstrates:  SR, 77 bpm with TWI in anterolateral leads Telemetry:  Telemetry was personally reviewed and demonstrates:  SR  Relevant CV Studies:  Cath: 12/2019  2-week history of progressive new onset angina. 99% large first diagonal treated with 2.75 x 15 Onyx postdilated to 3.0 mm and 0% stenosis. Left main is widely patent LAD contains mid eccentric 50% stenosis. Circumflex gives 1 large tortuous obtuse marginal and is free of disease. Dominant  with luminal  irregularities in the mid body.  No significant obstruction. Very mild mid anterior wall hypokinesis.  EF 50 to 55% which is mildly reduced for female.  LVEDP is 19 mmHg.   RECOMMENDATIONS:   Dual antiplatelet therapy for at least 6 months.  Initially aspirin and Brilinta.  After 4 to 6 weeks Brilinta could be decreased in intensity to clopidogrel plus aspirin. High intensity statin therapy added. Low-dose beta-blocker therapy added. Overnight observation since we have both radial and femoral access during the procedure.  Diagnostic Dominance: Right Intervention    Laboratory Data:  High Sensitivity Troponin:   Recent Labs  Lab 06/10/21 1338 06/10/21 1512 06/10/21 2054  TROPONINIHS 2 2 3      Chemistry Recent Labs  Lab 06/10/21 1338 06/11/21 0041  NA 140 138  K 4.2 4.1  CL 106 106  CO2 26 19*  GLUCOSE 109* 149*  BUN 10 12  CREATININE 0.76 0.80  CALCIUM 8.9 8.8*  MG  --  2.1  GFRNONAA >60 >60  ANIONGAP 8 13    Recent Labs  Lab 06/11/21 0041  PROT 6.2*  ALBUMIN 3.5  AST 25  ALT 17  ALKPHOS 111  BILITOT 1.5*   Lipids No results for input(s): CHOL, TRIG, HDL, LABVLDL, LDLCALC, CHOLHDL in the last 168 hours.  Hematology Recent Labs  Lab 06/10/21 1338 06/11/21 0041  WBC 5.4 7.0  RBC 4.64 4.63  HGB 13.7 14.1  HCT 43.7 42.5  MCV 94.2 91.8  MCH 29.5 30.5  MCHC 31.4 33.2  RDW 13.2 13.2  PLT 203 201   Thyroid No results for input(s): TSH, FREET4 in the last 168 hours.  BNPNo results for input(s): BNP, PROBNP in the last 168 hours.  DDimer  Recent Labs  Lab 06/10/21 2054  DDIMER 0.44     Radiology/Studies:  DG Chest 2 View  Result Date: 06/10/2021 CLINICAL DATA:  Chest pain EXAM: CHEST - 2 VIEW COMPARISON:  Chest x-ray 12/18/2019 FINDINGS: Heart size and mediastinal contours are within normal limits. No suspicious pulmonary opacities identified. No pleural effusion or pneumothorax visualized. No acute osseous abnormality appreciated.  IMPRESSION: No acute intrathoracic process identified. Electronically Signed   By: Ofilia Neas M.D.   On: 06/10/2021 13:59     Assessment and Plan:   [name] is a 70 y.o. female with a hx of HTN, HLD, obesity, DM, CAD s/p stenting of 1st diag who is being seen 06/11/2021 for the evaluation of chest pain at the request of Dr. Tawanna Solo.  Chest pain with prior hx of CAD s/p DES to 1st OM: High-sensitivity troponin negative x3.  EKG with T wave inversion/biphasic T wave in anterior lateral leads (somewhat similar to prior tracing).  No further chest pain since admission. Reports this is somewhat similar to what she experienced with prior stenting in 2021. Discussed further evaluation with stress testing given her negative enzymes vs relook cath, which she was agreeable to. Has been NPO. Will review with MD -- echo pending -- on ASA, statin, BB, ARB  HTN: Blood pressures are overall controlled. -- Continue Toprol 25 mg daily, losartan 50 mg daily  HLD: On atorvastatin 80 mg daily  DM: Hgb A1c 6.2 -- SSI while inpatient  Risk Assessment/Risk Scores:   HEAR Score (for undifferentiated chest pain):  HEAR Score: 5{  For questions or updates, please contact [place] Please consult [email or web address] for contact info under    Signed, Reino Bellis, NP  06/11/2021 10:12 AM

## 2021-06-12 DIAGNOSIS — I10 Essential (primary) hypertension: Secondary | ICD-10-CM | POA: Diagnosis not present

## 2021-06-12 DIAGNOSIS — I209 Angina pectoris, unspecified: Secondary | ICD-10-CM

## 2021-06-12 DIAGNOSIS — R079 Chest pain, unspecified: Secondary | ICD-10-CM | POA: Diagnosis not present

## 2021-06-12 LAB — GLUCOSE, CAPILLARY
Glucose-Capillary: 151 mg/dL — ABNORMAL HIGH (ref 70–99)
Glucose-Capillary: 125 mg/dL — ABNORMAL HIGH (ref 70–99)
Glucose-Capillary: 157 mg/dL — ABNORMAL HIGH (ref 70–99)

## 2021-06-12 MED ORDER — ISOSORBIDE MONONITRATE ER 30 MG PO TB24: 30.0000 mg | ORAL_TABLET | Freq: Every day | ORAL | 1 refills | Status: DC

## 2021-06-12 MED ORDER — ISOSORBIDE MONONITRATE ER 30 MG PO TB24
30.0000 mg | ORAL_TABLET | Freq: Every day | ORAL | Status: DC
  Administered 2021-06-12: 11:00:00 30 mg via ORAL
  Filled 2021-06-12: qty 1

## 2021-06-12 MED ORDER — TECHNETIUM TC 99M TETROFOSMIN IV KIT
31.7000 | PACK | Freq: Once | INTRAVENOUS | Status: AC | PRN
  Administered 2021-06-12: 09:00:00 31.7 via INTRAVENOUS

## 2021-06-12 NOTE — Plan of Care (Signed)

## 2021-06-12 NOTE — Progress Notes (Signed)
Progress Note  Patient Name: AVANELLE PIXLEY Date of Encounter: 06/12/2021  [facility] Cardiologist: Sinclair Grooms, MD   Subjective   Mrs Nunes is chest pain free. She wonders why she is all of a sudden feeling chest pain at home that resolves with sitting. Worries is a panic attack  Nuclear study  Moderate distal anterior wall to the apex, anterolateral fixed defect (worse at rest) more consistent with old scar No inducible ischemia  Inpatient Medications    Scheduled Meds:  aspirin EC  81 mg Oral Daily   atorvastatin  80 mg Oral Daily   enoxaparin (LOVENOX) injection  40 mg Subcutaneous Q24H   insulin aspart  0-15 Units Subcutaneous TID AC & HS   isosorbide mononitrate  30 mg Oral Daily   losartan  50 mg Oral Daily   metoprolol succinate  25 mg Oral Daily   pantoprazole  40 mg Oral Daily   Continuous Infusions:  PRN Meds: acetaminophen **OR** acetaminophen, clonazePAM, hydrALAZINE, HYDROcodone-acetaminophen, nitroGLYCERIN, ondansetron **OR** ondansetron (ZOFRAN) IV, polyethylene glycol   Vital Signs    Vitals:   06/11/21 1417 06/11/21 2100 06/12/21 0548 06/12/21 0944  BP: (!) 113/54 111/68 123/67 (!) 150/96  Pulse: 95 75 75 81  Resp:  18 18   Temp:  97.9 F (36.6 C) 98.5 F (36.9 C)   TempSrc:  Oral Oral   SpO2:  99% 97%   Weight:      Height:        Intake/Output Summary (Last 24 hours) at 06/12/2021 1028 Last data filed at 06/11/2021 2100 Gross per 24 hour  Intake 240 ml  Output --  Net 240 ml   Last 3 Weights 06/10/2021 02/18/2021 10/25/2020  Weight (lbs) 273 lb 13 oz 273 lb 3.2 oz 258 lb  Weight (kg) 124.2 kg 123.923 kg 117.028 kg      Telemetry    NSR - Personally Reviewed  ECG    No new - Personally Reviewed  Physical Exam   Vitals:   06/12/21 0548 06/12/21 0944  BP: 123/67 (!) 150/96  Pulse: 75 81  Resp: 18   Temp: 98.5 F (36.9 C)   SpO2: 97%      GEN: No acute distress.   Neck: No JVD Cardiac: RRR, no  murmurs, rubs, or gallops.  Respiratory: Clear to auscultation bilaterally. GI: Soft, nontender, non-distended  MS: No edema; No deformity. Neuro:  Nonfocal  Psych: Normal affect   Labs    High Sensitivity Troponin:   Recent Labs  Lab 06/10/21 1338 06/10/21 1512 06/10/21 2054  TROPONINIHS 2 2 3      Chemistry Recent Labs  Lab 06/10/21 1338 06/11/21 0041  NA 140 138  K 4.2 4.1  CL 106 106  CO2 26 19*  GLUCOSE 109* 149*  BUN 10 12  CREATININE 0.76 0.80  CALCIUM 8.9 8.8*  MG  --  2.1  PROT  --  6.2*  ALBUMIN  --  3.5  AST  --  25  ALT  --  17  ALKPHOS  --  111  BILITOT  --  1.5*  GFRNONAA >60 >60  ANIONGAP 8 13    Lipids No results for input(s): CHOL, TRIG, HDL, LABVLDL, LDLCALC, CHOLHDL in the last 168 hours.  Hematology Recent Labs  Lab 06/10/21 1338 06/11/21 0041  WBC 5.4 7.0  RBC 4.64 4.63  HGB 13.7 14.1  HCT 43.7 42.5  MCV 94.2 91.8  MCH 29.5 30.5  MCHC 31.4 33.2  RDW 13.2 13.2  PLT 203 201   Thyroid No results for input(s): TSH, FREET4 in the last 168 hours.  BNPNo results for input(s): BNP, PROBNP in the last 168 hours.  DDimer  Recent Labs  Lab 06/10/21 2054  DDIMER 0.44     Radiology    DG Chest 2 View  Result Date: 06/10/2021 CLINICAL DATA:  Chest pain EXAM: CHEST - 2 VIEW COMPARISON:  Chest x-ray 12/18/2019 FINDINGS: Heart size and mediastinal contours are within normal limits. No suspicious pulmonary opacities identified. No pleural effusion or pneumothorax visualized. No acute osseous abnormality appreciated. IMPRESSION: No acute intrathoracic process identified. Electronically Signed   By: Ofilia Neas M.D.   On: 06/10/2021 13:59   NM Myocar Multi W/Spect W/Wall Motion / EF  Result Date: 06/12/2021 CLINICAL DATA:  70 year old female with chest pain. EXAM: MYOCARDIAL IMAGING WITH SPECT (REST AND PHARMACOLOGIC-STRESS - 2 DAY PROTOCOL) GATED LEFT VENTRICULAR WALL MOTION STUDY LEFT VENTRICULAR EJECTION FRACTION TECHNIQUE: Standard  myocardial SPECT imaging was performed after resting intravenous injection of 30 mCi Tc-65m tetrofosmin. Subsequently, on a second day, intravenous infusion of Lexiscan was performed under the supervision of the Cardiology staff. At peak effect of the drug, 30 mCi Tc-42m tetrofosmin was injected intravenously and standard myocardial SPECT imaging was performed. Quantitative gated imaging was also performed to evaluate left ventricular wall motion, and estimate left ventricular ejection fraction. COMPARISON:  Chest radiograph 06/10/2021 FINDINGS: Perfusion: There is moderate decreased perfusion to the anterior and anterolateral wall involving the apical and mid segment. This decreased perfusion is more prominent on the rest imaging than the stress imaging. Findings indicate a prior scar/infarction. No evidence reversible ischemia. Wall Motion: Normal left ventricular wall motion. No left ventricular dilation. Left Ventricular Ejection Fraction: Greater than 70 % End diastolic volume 67 ml End systolic volume 19 ml IMPRESSION: 1. No reversible ischemia.  Suspect anterolateral infarct. 2. Normal left ventricular wall motion. 3. Left ventricular ejection fraction greater than 70% 4. Non invasive risk stratification*: Low *2012 Appropriate Use Criteria for Coronary Revascularization Focused Update: J Am Coll Cardiol. 5852;77(8):242-353. http://content.[email or web address].com.aspx?articleid=[email or web address] Electronically Signed   By: Suzy Bouchard M.D.   On: 06/12/2021 10:04   ECHOCARDIOGRAM COMPLETE  Result Date: 06/11/2021    ECHOCARDIOGRAM REPORT   Patient Name:   PAMMIE CHIRINO Date of Exam: 06/11/2021 Medical Rec #:  [id number]           Height:       61.0 in Accession #:    [id number]          Weight:       273.8 lb Date of Birth:  11-25-1950            BSA:          2.159 m Patient Age:    70 years            BP:           129/67 mmHg Patient Gender: F                   HR:           75 bpm. Exam Location:   Inpatient Procedure: 2D Echo, 3D Echo, Cardiac Doppler and Color Doppler Indications:    R07.9* Chest pain, unspecified  History:        Patient has no prior history of Echocardiogram examinations.                 CHF, CAD and Previous  Myocardial Infarction,                 Signs/Symptoms:Dyspnea and Chest Pain; Risk                 Factors:Hypertension, Diabetes and Dyslipidemia.  Sonographer:    Roseanna Rainbow RDCS Referring Phys: [id number] [place]  Sonographer Comments: Patient is morbidly obese. Image acquisition challenging due to patient body habitus. IMPRESSIONS  1. Left ventricular ejection fraction, by estimation, is 65 to 70%. The left ventricle has normal function. The left ventricle has no regional wall motion abnormalities. Left ventricular diastolic parameters were normal.  2. Right ventricular systolic function is normal. The right ventricular size is normal. Tricuspid regurgitation signal is inadequate for assessing PA pressure.  3. The mitral valve is grossly normal. No evidence of mitral valve regurgitation. No evidence of mitral stenosis.  4. The aortic valve is grossly normal. Aortic valve regurgitation is not visualized. No aortic stenosis is present.  5. The inferior vena cava is normal in size with <50% respiratory variability, suggesting right atrial pressure of 8 mmHg. Comparison(s): No prior Echocardiogram. Conclusion(s)/Recommendation(s): Normal biventricular function without evidence of hemodynamically significant valvular heart disease. FINDINGS  Left Ventricle: Left ventricular ejection fraction, by estimation, is 65 to 70%. The left ventricle has normal function. The left ventricle has no regional wall motion abnormalities. The left ventricular internal cavity size was normal in size. There is  borderline left ventricular hypertrophy. Left ventricular diastolic parameters were normal. Right Ventricle: The right ventricular size is normal. No increase in right ventricular wall  thickness. Right ventricular systolic function is normal. Tricuspid regurgitation signal is inadequate for assessing PA pressure. Left Atrium: Left atrial size was normal in size. Right Atrium: Right atrial size was normal in size. Pericardium: There is no evidence of pericardial effusion. Presence of epicardial fat layer. Mitral Valve: The mitral valve is grossly normal. No evidence of mitral valve regurgitation. No evidence of mitral valve stenosis. Tricuspid Valve: The tricuspid valve is grossly normal. Tricuspid valve regurgitation is not demonstrated. No evidence of tricuspid stenosis. Aortic Valve: The aortic valve is grossly normal. Aortic valve regurgitation is not visualized. No aortic stenosis is present. Pulmonic Valve: The pulmonic valve was not well visualized. Pulmonic valve regurgitation is not visualized. Aorta: The aortic root, ascending aorta and aortic arch are all structurally normal, with no evidence of dilitation or obstruction. Venous: The inferior vena cava is normal in size with less than 50% respiratory variability, suggesting right atrial pressure of 8 mmHg. IAS/Shunts: The atrial septum is grossly normal.  LEFT VENTRICLE PLAX 2D LVIDd:         4.80 cm     Diastology LVIDs:         2.90 cm     LV e' medial:    5.00 cm/s LV PW:         1.20 cm     LV E/e' medial:  12.7 LV IVS:        1.00 cm     LV e' lateral:   6.53 cm/s LVOT diam:     2.10 cm     LV E/e' lateral: 9.7 LV SV:         74 LV SV Index:   34 LVOT Area:     3.46 cm                             3D Volume EF: LV  Volumes (MOD)           3D EF:        67 % LV vol d, MOD A2C: 47.6 ml LV EDV:       104 ml LV vol d, MOD A4C: 70.3 ml LV ESV:       35 ml LV vol s, MOD A2C: 15.5 ml LV SV:        69 ml LV vol s, MOD A4C: 24.7 ml LV SV MOD A2C:     32.1 ml LV SV MOD A4C:     70.3 ml LV SV MOD BP:      38.4 ml RIGHT VENTRICLE             IVC RV S prime:     12.60 cm/s  IVC diam: 1.80 cm TAPSE (M-mode): 1.6 cm LEFT ATRIUM           Index         RIGHT ATRIUM           Index LA diam:      3.60 cm 1.67 cm/m   RA Area:     13.80 cm LA Vol (A2C): 25.7 ml 11.91 ml/m  RA Volume:   32.60 ml  15.10 ml/m LA Vol (A4C): 18.9 ml 8.76 ml/m  AORTIC VALVE LVOT Vmax:   104.00 cm/s LVOT Vmean:  75.000 cm/s LVOT VTI:    0.214 m  AORTA Ao Root diam: 3.30 cm Ao Asc diam:  3.30 cm MITRAL VALVE MV Area (PHT): 2.99 cm    SHUNTS MV Decel Time: 254 msec    Systemic VTI:  0.21 m MV E velocity: 63.40 cm/s  Systemic Diam: 2.10 cm MV A velocity: 97.40 cm/s MV E/A ratio:  0.65 Buford Dresser MD Electronically signed by Buford Dresser MD Signature Date/Time: 06/11/2021/1:06:59 PM    Final     Cardiac Studies   Nuclear SPECT 12/20  FINDINGS: Perfusion: There is moderate decreased perfusion to the anterior and anterolateral wall involving the apical and mid segment. This decreased perfusion is more prominent on the rest imaging than the stress imaging. Findings indicate a prior scar/infarction.   No evidence reversible ischemia.   Wall Motion: Normal left ventricular wall motion. No left ventricular dilation.   Left Ventricular Ejection Fraction: Greater than 70 %   End diastolic volume 67 ml   End systolic volume 19 ml   IMPRESSION: 1. No reversible ischemia.  Suspect anterolateral infarct.   2. Normal left ventricular wall motion.   3. Left ventricular ejection fraction greater than 70%   4. Non invasive risk stratification*: Low  TTE 06/11/2021  1. Left ventricular ejection fraction, by estimation, is 65 to 70%. The  left ventricle has normal function. The left ventricle has no regional  wall motion abnormalities. Left ventricular diastolic parameters were  normal.   2. Right ventricular systolic function is normal. The right ventricular  size is normal. Tricuspid regurgitation signal is inadequate for assessing  PA pressure.   3. The mitral valve is grossly normal. No evidence of mitral valve  regurgitation. No evidence of  mitral stenosis.   4. The aortic valve is grossly normal. Aortic valve regurgitation is not  visualized. No aortic stenosis is present.   5. The inferior vena cava is normal in size with <50% respiratory  variability, suggesting right atrial pressure of 8 mmHg.   LHC 12/23/2019 2-week history of progressive new onset angina. 99% large first diagonal treated with 2.75 x  15 Onyx postdilated to 3.0 mm and 0% stenosis. Left main is widely patent LAD contains mid eccentric 50% stenosis. Circumflex gives 1 large tortuous obtuse marginal and is free of disease. Dominant with luminal irregularities in the mid body.  No significant obstruction. Very mild mid anterior wall hypokinesis.  EF 50 to 55% which is mildly reduced for female.  LVEDP is 19 mmHg.    Patient Profile     LINNAEA AHN is a 70 y.o. female with a hx of HTN, HLD, obesity, DM, CAD s/p stenting of 1st diag who is being seen 06/11/2021 for the evaluation of chest pain at the request of Dr. Tawanna Solo. She had a tight large diagonal lesion in 2021 with TIMI 3 flow post PCI, 50% mid Lad lesion. Managed with brillinta then developed dyspnea. Transitioned to plavix. Was on dapt for 1 year and transitioned to aspirin. Presents with atypical chest pain, troponin negative x3. EKG non specific ST changes. Echo shows normal LV function, no wall motion.   Assessment & Plan    #Stable Angina: Ddx includes stable angina v anxiety. Nuclear stress shows infarct in large D1 territory c/w prior 99% lesion. No inducible ischemia. Will add imdur for chest pain.  - started imdur 30 mg daily - continue asa 81 mg daily - continue atorvastatin 80 mg daily - continue metop XL 25 mg daily - nitro SL PRN  #HTN -continue losartan 50 mg daily   She can be discharged from a cardiac standpoint. We will work on a follow-up appointment  For questions or updates, please contact [facility] Please consult [email or web address] for contact info under         Signed, Janina Mayo, MD  06/12/2021, 10:28 AM

## 2021-06-12 NOTE — Progress Notes (Signed)
Mobility Specialist Progress Note   06/12/21 1100  Mobility  Activity Ambulated in hall  Level of Assistance Independent  Assistive Device None  Distance Ambulated (ft) 470 ft  Mobility Ambulated independently in hallway  Mobility Response Tolerated well  Mobility performed by Mobility specialist  $Mobility charge 1 Mobility   Received pt in bed having no complaints and agreeable to mobility. Asymptomatic throughout ambulation, returned back to bed w/ call bell by side and all needs met.  Holland Falling Mobility Specialist Phone Number [phone]

## 2021-06-17 ENCOUNTER — Other Ambulatory Visit: Payer: Self-pay | Admitting: Interventional Cardiology

## 2021-06-18 ENCOUNTER — Other Ambulatory Visit: Payer: Self-pay | Admitting: *Deleted

## 2021-06-18 ENCOUNTER — Telehealth: Payer: Self-pay | Admitting: Interventional Cardiology

## 2021-06-18 MED ORDER — LOSARTAN POTASSIUM 50 MG PO TABS: 50.0000 mg | ORAL_TABLET | Freq: Every day | ORAL | 3 refills | Status: DC

## 2021-06-18 NOTE — Telephone Encounter (Signed)
Called patient back about her message. Patient complaining about chest pain and had went to the ED. Patient stated she had chest pain that started this morning while she was sitting and drinking her morning coffee and gradually went away. Patient stated this has been going on for a while and that is why she went to the ED. Patient denies any other symptoms. Patient's cardiology work up at the ED was all good. Troponin's were negative, EKG with no changes, and stress test showed no evidence of reversible ischemia. Asked patient if she has been taking her imdur. Patient stated no, encouraged patient to start taking it. Patient is wondering if she should take all her medications at night. Informed patient to continue to take her medications as prescribed and to make sure she takes her Imdur to see if that helps with the episodes of chest pain. Informed patient that a message would be sent to Dr. Tamala Julian for further advisement.

## 2021-06-18 NOTE — Telephone Encounter (Signed)
Pt c/o of Chest Pain: STAT if CP now or developed within 24 hours  1. Are you having CP right now? no  2. Are you experiencing any other symptoms (ex. SOB, nausea, vomiting, sweating)? no  3. How long have you been experiencing CP? Since 12/19  4. Is your CP continuous or coming and going? Comes and goes  5. Have you taken Nitroglycerin? no   Patient states she has been having chest pains like when she had her stent put in. She says she was in the hospital 12/19 and they did not find anything. She says she is not sure if it is panic attacks, but she is not sure what a panic attack is.   ?

## 2021-06-25 NOTE — Telephone Encounter (Signed)
Agree with imdur and use of SL NTG. No smoking if possible/applicable.  ----- Message -----  From: Michaelyn Barter, RN  Sent: 06/18/2021   3:47 PM EST  To: Belva Crome, MD, Loren Racer, RN   Spoke with pt and made her aware that Dr. Tamala Julian agreed with pt trying Imdur again.  Pt states she took it for 2 days and stopped it again because she wasn't sure if Dr. Tamala Julian really wanted her back on that.  She mentioned that she switched her medications to nighttime instead of daytime and has noticed much improvement.  Pt will add the Imdur back and see if that resolves the pain completely.  Pt aware to call before her appt on 1/25 if anything worsens.  Pt appreciative for call.

## 2021-07-02 DIAGNOSIS — R519 Headache, unspecified: Secondary | ICD-10-CM | POA: Diagnosis not present

## 2021-07-02 DIAGNOSIS — Z9289 Personal history of other medical treatment: Secondary | ICD-10-CM | POA: Diagnosis not present

## 2021-07-02 DIAGNOSIS — R0789 Other chest pain: Secondary | ICD-10-CM | POA: Diagnosis not present

## 2021-07-16 ENCOUNTER — Other Ambulatory Visit: Payer: Self-pay | Admitting: Interventional Cardiology

## 2021-07-17 ENCOUNTER — Telehealth: Payer: Self-pay | Admitting: Interventional Cardiology

## 2021-07-17 ENCOUNTER — Ambulatory Visit: Payer: PPO | Admitting: Nurse Practitioner

## 2021-07-17 DIAGNOSIS — I25110 Atherosclerotic heart disease of native coronary artery with unstable angina pectoris: Secondary | ICD-10-CM

## 2021-07-17 DIAGNOSIS — R079 Chest pain, unspecified: Secondary | ICD-10-CM

## 2021-07-17 DIAGNOSIS — I10 Essential (primary) hypertension: Secondary | ICD-10-CM

## 2021-07-17 NOTE — Telephone Encounter (Signed)
Spoke with pt and she states that she had to cancel today's appt because she has a pinched nerve in her back and is in pain.  States she was coming in today because she can feel her heart beating in her chest and up into her neck.  Feels regular and doesn't feel like it's racing.  Just unusual for her and she was concerned because her sister has AF.  No BP or HR readings.  Advised pt to increase water intake, states she hardly drinks water.  Mostly drinks diet sodas.  Pt would like to wear a monitor if Dr. Tamala Julian is agreeable.  She said she would just feel better checking to make sure she's not having AF.  Will route to Dr. Tamala Julian.

## 2021-07-17 NOTE — Telephone Encounter (Signed)
Heartbeats that starts in the chest and patient feels like it goes to her neck. Patient had to cancel today appt because she pull muscle in her back. Please advise

## 2021-07-19 ENCOUNTER — Ambulatory Visit (INDEPENDENT_AMBULATORY_CARE_PROVIDER_SITE_OTHER): Payer: PPO

## 2021-07-19 ENCOUNTER — Other Ambulatory Visit: Payer: Self-pay | Admitting: Interventional Cardiology

## 2021-07-19 DIAGNOSIS — I25110 Atherosclerotic heart disease of native coronary artery with unstable angina pectoris: Secondary | ICD-10-CM

## 2021-07-19 DIAGNOSIS — R079 Chest pain, unspecified: Secondary | ICD-10-CM

## 2021-07-19 DIAGNOSIS — I10 Essential (primary) hypertension: Secondary | ICD-10-CM

## 2021-07-19 DIAGNOSIS — R002 Palpitations: Secondary | ICD-10-CM

## 2021-07-19 NOTE — Telephone Encounter (Signed)
Spoke with the patient and advised her that Dr. Tamala Julian was okay with ordering her a 2 week heart monitor. Orders have been placed.

## 2021-07-19 NOTE — Progress Notes (Unsigned)
Enrolled patient for a 14 day Zio XT  monitor to be mailed to patients home  °

## 2021-07-26 DIAGNOSIS — I25110 Atherosclerotic heart disease of native coronary artery with unstable angina pectoris: Secondary | ICD-10-CM | POA: Diagnosis not present

## 2021-07-26 DIAGNOSIS — R002 Palpitations: Secondary | ICD-10-CM

## 2021-07-26 DIAGNOSIS — R079 Chest pain, unspecified: Secondary | ICD-10-CM | POA: Diagnosis not present

## 2021-07-26 DIAGNOSIS — I10 Essential (primary) hypertension: Secondary | ICD-10-CM | POA: Diagnosis not present

## 2021-08-03 DIAGNOSIS — M5416 Radiculopathy, lumbar region: Secondary | ICD-10-CM | POA: Diagnosis not present

## 2021-08-15 DIAGNOSIS — I25110 Atherosclerotic heart disease of native coronary artery with unstable angina pectoris: Secondary | ICD-10-CM | POA: Diagnosis not present

## 2021-08-15 DIAGNOSIS — R002 Palpitations: Secondary | ICD-10-CM | POA: Diagnosis not present

## 2021-08-15 DIAGNOSIS — I10 Essential (primary) hypertension: Secondary | ICD-10-CM | POA: Diagnosis not present

## 2021-08-15 DIAGNOSIS — R079 Chest pain, unspecified: Secondary | ICD-10-CM | POA: Diagnosis not present

## 2021-08-29 NOTE — Progress Notes (Unsigned)
Cardiology Office Note:    Date:  08/29/2021   ID:  [name], DOB July 08, 1950, MRN [id number]  PCP:  [name], DO   [place] [facility] Cardiologist:  Sinclair Grooms, MD { Click to update primary MD,subspecialty MD or APP then REFRESH:1}    Referring MD: [name], DO   No chief complaint on file. ***  History of Present Illness:    [name] is a 71 y.o. female with a hx of CAD s/p with unstable angina, HTN, chronic HFpEF, diabetes, palpitations, and obesity.  She underwent remote cardiac cath in 2002 with no evidence of CAD. She has been seen intermittently over the past few years by Dr. Tamala Julian for chest pain and palpitations. Family history significant for CAD. She had progressive angina and underwent cardiac catheterization on 12/23/19 which revealed 99% large first diagonal treated with DES, widely patent left main, LAD mid eccentric 50% stenosis, circumflex gives 1 large tortuous obtuse marginal and is free of disease, dominant with luminal irregularities of the mid body, no significant obstruction (?RCA), very mild mid anterior hypokinesis, EF 50-55%, LVEDP 19 mmHg. DAPT therapy advised. She was intolerant of Brilinta due to dyspnea. Was switched to aspirin and Plavix but had bruising that was not acceptable. She has continued single antiplatelet therapy initially with Plavix then switched to aspirin.   She was last seen in our office by Dr. Tamala Julian on 02/18/21 at which time she reported no acute concerns and she was advised to follow-up in 12 months.   She called our office on 07/17/21 with concerns for feelings of heart beating hard in her chest and radiating up in her neck. Cardiac monitor revealed NSR, no atrial fibrillation, no pauses, rare PACs and PVCs both with burden less than 1%, brief nonsustained SVT with longest run 10 beats.   Today, she is here    Past Medical History:  Diagnosis Date   Anosmia 10/30/2015   Atypical facial pain 10/30/2015    Left upper face   Chronic pain syndrome 07/09/2017   Coronary artery disease    Depression    Diabetes ([place]) 12/02/2013   no current meds    Dyspnea on exertion 09/20/2015   Gastro-esophageal reflux    GERD (gastroesophageal reflux disease) 05/25/2012   Hyperlipidemia    Hypertension    Hypothyroidism 05/25/2012   Last Assessment & Plan:  States was started after a partial thyroidectomy but does not recall any abnormal lab value and no symptoms.  Discussed as is on low dose, would be reasonable to discontinue and recheck in 6-8 weeks which she would like to do. Last Assessment & Plan:  States was started after a partial thyroidectomy but does not recall any abnormal lab value and no symptoms.  Discussed as    Lumbar spondylosis 07/09/2017   Memory disorder 10/30/2015   Obesity, morbid, BMI 40.0-49.9 ([place]) 06/02/2014   Osteoporosis 05/02/2013   Overview:  DEXA 11/14 DEXA 11/14   Restless legs syndrome 05/25/2012   Last Assessment & Plan:  patient reports using clonazepam about once weekly .  Discussed nonpharmacologic tips for treating RLS.  States other medicatiosn she had treid had not been effective (does not remember name) Will refill for now.  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   URI, acute 09/20/2015   Vitamin  B 12 deficiency    Vitamin D deficiency 05/25/2012    Past Surgical History:  Procedure Laterality Date   ABDOMINAL HYSTERECTOMY     APPENDECTOMY     CARDIAC CATHETERIZATION  12/23/2019   CATARACT EXTRACTION     CHOLECYSTECTOMY     CORONARY STENT INTERVENTION N/A 12/23/2019   Procedure: CORONARY STENT INTERVENTION;  Surgeon: Belva Crome, MD;  Location: [facility];  Service: Cardiovascular;  Laterality: N/A;   JOINT REPLACEMENT     2 total knee replacement   LASIK     LEFT HEART CATH AND CORONARY ANGIOGRAPHY N/A 12/23/2019    Procedure: LEFT HEART CATH AND CORONARY ANGIOGRAPHY;  Surgeon: Belva Crome, MD;  Location: [facility];  Service: Cardiovascular;  Laterality: N/A;   REVERSE SHOULDER ARTHROPLASTY Right 04/03/2020   Procedure: REVERSE SHOULDER ARTHROPLASTY;  Surgeon: Nicholes Stairs, MD;  Location: [facility];  Service: Orthopedics;  Laterality: Right;  2.5 hrs   SHOULDER ARTHROSCOPY WITH ROTATOR CUFF REPAIR AND SUBACROMIAL DECOMPRESSION Left 10/25/2020   Procedure: Left shoulder arthroscopic, extensive debridement, subacromial decompression;  Surgeon: Nicholes Stairs, MD;  Location: [facility];  Service: Orthopedics;  Laterality: Left;  90 mins   THYROIDECTOMY     TONSILLECTOMY      Current Medications: No outpatient medications have been marked as taking for the 09/03/21 encounter (Appointment) with Ann Maki, Lanice Schwab, NP.     Allergies:   Patient has no known allergies.   Social History   Socioeconomic History   Marital status: Divorced    Spouse name: Not on file   Number of children: Not on file   Years of education: Not on file   Highest education level: Not on file  Occupational History   Not on file  Tobacco Use   Smoking status: Former    Types: Cigarettes    Quit date: 02/17/1987    Years since quitting: 34.5   Smokeless tobacco: Never  Vaping Use   Vaping Use: Never used  Substance and Sexual Activity   Alcohol use: No   Drug use: No   Sexual activity: Not Currently    Birth control/protection: Post-menopausal, Surgical    Comment: Hysterectomy  Other Topics Concern   Not on file  Social History Narrative   Lives at home alone   Right-handed   Social Determinants of Health   Financial Resource Strain: Not on file  Food Insecurity: Not on file  Transportation Needs: Not on file  Physical Activity: Not on file  Stress: Not on file  Social Connections: Not on file     Family History: The patient's ***family history includes Cancer in her father and mother;  Diabetes in her brother, brother, and sister; Heart disease in her brother; Hypertension in her mother. There is no history of Breast cancer.  ROS:   Please see the history of present illness.    *** All other systems reviewed and are negative.  Labs/Other Studies Reviewed:    The following studies were reviewed today:  Cardiac monitor 08/15/21  The basic underlying rhythm is normal sinus rhythm with average heart rate 66 bpm. Range 47 bpm to 160 bpm. Brief nonsustained SVT with longest run of 10 beats. Max heart rate 160 bpm. Rare PACs and PVCs, both with burden less than 1%. No atrial fibrillation no pauses.   No atrial fibrillation or sustained arrhythmias identified.   Myoveiw 05/2021  IMPRESSION: 1. No reversible ischemia.  Suspect anterolateral infarct.   2.  Normal left ventricular wall motion.   3. Left ventricular ejection fraction greater than 70%   4. Non invasive risk stratification*: Low   Echo 05/2021  Left Ventricle: Left ventricular ejection fraction, by estimation, is 65  to 70%. The left ventricle has normal function. The left ventricle has no  regional wall motion abnormalities. The left ventricular internal cavity  size was normal in size. There is  borderline left ventricular hypertrophy. Left ventricular diastolic parameters were normal.  Right Ventricle: The right ventricular size is normal. No increase in  right ventricular wall thickness. Right ventricular systolic function is  normal. Tricuspid regurgitation signal is inadequate for assessing PA  pressure.  Left Atrium: Left atrial size was normal in size.  Right Atrium: Right atrial size was normal in size.  Pericardium: There is no evidence of pericardial effusion. Presence of  epicardial fat layer.  Mitral Valve: The mitral valve is grossly normal. No evidence of mitral  valve regurgitation. No evidence of mitral valve stenosis.  Tricuspid Valve: The tricuspid valve is grossly normal.  Tricuspid valve  regurgitation is not demonstrated. No evidence of tricuspid stenosis.  Aortic Valve: The aortic valve is grossly normal. Aortic valve  regurgitation is not visualized. No aortic stenosis is present.  Pulmonic Valve: The pulmonic valve was not well visualized. Pulmonic valve  regurgitation is not visualized.  Aorta: The aortic root, ascending aorta and aortic arch are all  structurally normal, with no evidence of dilitation or obstruction.  Venous: The inferior vena cava is normal in size with less than 50%  respiratory variability, suggesting right atrial pressure of 8 mmHg.  IAS/Shunts: The atrial septum is grossly normal.   LHC 12/2019  99% large first diagonal treated with 2.75 x 15 Onyx postdilated to 3.0 mm and 0% stenosis. Left main is widely patent LAD contains mid eccentric 50% stenosis. Circumflex gives 1 large tortuous obtuse marginal and is free of disease. Dominant with luminal irregularities in the mid body.  No significant obstruction. Very mild mid anterior wall hypokinesis.  EF 50 to 55% which is mildly reduced for female.  LVEDP is 19 mmHg.   RECOMMENDATIONS:   Dual antiplatelet therapy for at least 6 months.  Initially aspirin and Brilinta.  After 4 to 6 weeks Brilinta could be decreased in intensity to clopidogrel plus aspirin. High intensity statin therapy added. Low-dose beta-blocker therapy added. Overnight observation since we have both radial and femoral access during the procedure  Recent Labs: 06/11/2021: ALT 17; BUN 12; Creatinine, Ser 0.80; Hemoglobin 14.1; Magnesium 2.1; Platelets 201; Potassium 4.1; Sodium 138  Recent Lipid Panel    Component Value Date/Time   CHOL 133 02/03/2020 0935   TRIG 75 02/03/2020 0935   HDL 67 02/03/2020 0935   CHOLHDL 2.0 02/03/2020 0935   LDLCALC 51 02/03/2020 0935     Risk Assessment/Calculations:   {Does this patient have ATRIAL FIBRILLATION?:[phone]}       Physical Exam:    VS:  There were  no vitals taken for this visit.    Wt Readings from Last 3 Encounters:  06/10/21 273 lb 13 oz (124.2 kg)  02/18/21 273 lb 3.2 oz (123.9 kg)  10/25/20 258 lb (117 kg)     GEN: *** Well nourished, well developed in no acute distress HEENT: Normal NECK: No JVD; No carotid bruits CARDIAC: ***RRR, no murmurs, rubs, gallops RESPIRATORY:  Clear to auscultation without rales, wheezing or rhonchi  ABDOMEN: Soft, non-tender, non-distended MUSCULOSKELETAL:  No edema; No deformity. *** pedal  pulses, ***bilaterally SKIN: Warm and dry NEUROLOGIC:  Alert and oriented x 3 PSYCHIATRIC:  Normal affect   EKG:  EKG is *** ordered today.  The ekg ordered today demonstrates ***  Diagnoses:    No diagnosis found. Assessment and Plan:     ***          {Are you ordering a CV Procedure (e.g. stress test, cath, DCCV, TEE, etc)?   Press F2        :[id number]}    Medication Adjustments/Labs and Tests Ordered: Current medicines are reviewed at length with the patient today.  Concerns regarding medicines are outlined above.  No orders of the defined types were placed in this encounter.  No orders of the defined types were placed in this encounter.   There are no Patient Instructions on file for this visit.   Signed, Emmaline Life, NP  08/29/2021 11:52 AM    [place]

## 2021-09-01 DIAGNOSIS — M5459 Other low back pain: Secondary | ICD-10-CM | POA: Diagnosis not present

## 2021-09-03 ENCOUNTER — Ambulatory Visit: Payer: PPO | Admitting: Nurse Practitioner

## 2021-09-10 NOTE — Progress Notes (Deleted)
?Cardiology Office Note:   ? ?Date:  09/10/2021  ? ?ID:  [name], DOB February 01, 1951, MRN [id number] ? ?PCP:  [name], DO  ?Cardiologist:  Sinclair Grooms, MD  ? ?Referring MD: [name], DO  ? ?No chief complaint on file. ? ? ?History of Present Illness:   ? ?[name] is a 71 y.o. female with a hx of hypertension, obesity, hyperlipidemia, DM II, with recent unstable angina treated with DE stent circumflex 12/2019 with good result.  Intolerant of Brilinta due to dyspnea.  Interventional procedure complicated by small radial artery requiring switch over to femoral approach which was complicated by late femoral bleed. ? ? ?*** ? ?Past Medical History:  ?Diagnosis Date  ? Anosmia 10/30/2015  ? Atypical facial pain 10/30/2015  ? Left upper face  ? Chronic pain syndrome 07/09/2017  ? Coronary artery disease   ? Depression   ? Diabetes ([place]) 12/02/2013  ? no current meds   ? Dyspnea on exertion 09/20/2015  ? Gastro-esophageal reflux   ? GERD (gastroesophageal reflux disease) 05/25/2012  ? Hyperlipidemia   ? Hypertension   ? Hypothyroidism 05/25/2012  ? Last Assessment & Plan:  States was started after a partial thyroidectomy but does not recall any abnormal lab value and no symptoms.  Discussed as is on low dose, would be reasonable to discontinue and recheck in 6-8 weeks which she would like to do. Last Assessment & Plan:  States was started after a partial thyroidectomy but does not recall any abnormal lab value and no symptoms.  Discussed as   ? Lumbar spondylosis 07/09/2017  ? Memory disorder 10/30/2015  ? Obesity, morbid, BMI 40.0-49.9 ([place]) 06/02/2014  ? Osteoporosis 05/02/2013  ? Overview:  DEXA 11/14 DEXA 11/14  ? Restless legs syndrome 05/25/2012  ? Last Assessment & Plan:  patient reports using clonazepam about once weekly .  Discussed nonpharmacologic tips for treating RLS.  States other medicatiosn she had treid had not been effective (does not remember name) Will refill for now.  Advised if use  increases, will readdress in future  ? RLS (restless legs syndrome)   ? Thyroid disease   ? Unstable angina ([place]) 09/20/2015  ? 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 URI, acute 09/20/2015  ? Vitamin B 12 deficiency   ? Vitamin D deficiency 05/25/2012  ? ? ?Past Surgical History:  ?Procedure Laterality Date  ? ABDOMINAL HYSTERECTOMY    ? APPENDECTOMY    ? CARDIAC CATHETERIZATION  12/23/2019  ? CATARACT EXTRACTION    ? CHOLECYSTECTOMY    ? CORONARY STENT INTERVENTION N/A 12/23/2019  ? Procedure: CORONARY STENT INTERVENTION;  Surgeon: Belva Crome, MD;  Location: [facility];  Service: Cardiovascular;  Laterality: N/A;  ? JOINT REPLACEMENT    ? 2 total knee replacement  ? LASIK    ? LEFT HEART CATH AND CORONARY ANGIOGRAPHY N/A 12/23/2019  ? Procedure: LEFT HEART CATH AND CORONARY ANGIOGRAPHY;  Surgeon: Belva Crome, MD;  Location: [facility];  Service: Cardiovascular;  Laterality: N/A;  ? REVERSE SHOULDER ARTHROPLASTY Right 04/03/2020  ? Procedure: REVERSE SHOULDER ARTHROPLASTY;  Surgeon: Nicholes Stairs, MD;  Location: [facility];  Service: Orthopedics;  Laterality: Right;  2.5 hrs  ? SHOULDER ARTHROSCOPY WITH ROTATOR CUFF REPAIR AND SUBACROMIAL DECOMPRESSION Left 10/25/2020  ? Procedure: Left shoulder arthroscopic, extensive debridement, subacromial decompression;  Surgeon: Nicholes Stairs, MD;  Location: [facility];  Service: Orthopedics;  Laterality: Left;  90 mins  ? THYROIDECTOMY    ? TONSILLECTOMY    ? ? ?Current Medications: ?No outpatient medications have been marked as taking for the 09/12/21 encounter (Appointment) with Belva Crome, MD.  ?  ? ?Allergies:   Patient has no known allergies.  ? ?Social History  ? ?Socioeconomic History  ? Marital status: Divorced  ?  Spouse name: Not on file  ? Number of children: Not on file  ? Years of education: Not on file  ? Highest education level: Not on file  ?Occupational History  ? Not on file  ?Tobacco Use  ? Smoking status: Former  ?  Types:  Cigarettes  ?  Quit date: 02/17/1987  ?  Years since quitting: 34.5  ? Smokeless tobacco: Never  ?Vaping Use  ? Vaping Use: Never used  ?Substance and Sexual Activity  ? Alcohol use: No  ? Drug use: No  ? Sexual activity: Not Currently  ?  Birth control/protection: Post-menopausal, Surgical  ?  Comment: Hysterectomy  ?Other Topics Concern  ? Not on file  ?Social History Narrative  ? Lives at home alone  ? Right-handed  ? ?Social Determinants of Health  ? ?Financial Resource Strain: Not on file  ?Food Insecurity: Not on file  ?Transportation Needs: Not on file  ?Physical Activity: Not on file  ?Stress: Not on file  ?Social Connections: Not on file  ?  ? ?Family History: ?The patient's family history includes Cancer in her father and mother; Diabetes in her brother, brother, and sister; Heart disease in her brother; Hypertension in her mother. There is no history of Breast cancer. ? ?ROS:   ?Please see the history of present illness.    ?*** All other systems reviewed and are negative. ? ?EKGs/Labs/Other Studies Reviewed:   ? ?The following studies were reviewed today: ?*** ? ?EKG:  EKG *** ? ?Recent Labs: ?06/11/2021: ALT 17; BUN 12; Creatinine, Ser 0.80; Hemoglobin 14.1; Magnesium 2.1; Platelets 201; Potassium 4.1; Sodium 138  ?Recent Lipid Panel ?   ?Component Value Date/Time  ? CHOL 133 02/03/2020 0935  ? TRIG 75 02/03/2020 0935  ? HDL 67 02/03/2020 0935  ? CHOLHDL 2.0 02/03/2020 0935  ? [place] 51 02/03/2020 0935  ? ? ?Physical Exam:   ? ?VS:  There were no vitals taken for this visit.   ? ?Wt Readings from Last 3 Encounters:  ?06/10/21 273 lb 13 oz (124.2 kg)  ?02/18/21 273 lb 3.2 oz (123.9 kg)  ?10/25/20 258 lb (117 kg)  ?  ? ?GEN: ***. No acute distress ?HEENT: Normal ?NECK: No JVD. ?LYMPHATICS: No lymphadenopathy ?CARDIAC: *** murmur. RRR *** gallop, or edema. ?VASCULAR: *** Normal Pulses. No bruits. ?RESPIRATORY:  Clear to auscultation without rales, wheezing or rhonchi  ?ABDOMEN: Soft, non-tender,  non-distended, No pulsatile mass, ?MUSCULOSKELETAL: No deformity  ?SKIN: Warm and dry ?NEUROLOGIC:  Alert and oriented x 3 ?PSYCHIATRIC:  Normal affect  ? ?ASSESSMENT:   ? ?1. Coronary artery disease involving native coronary artery of native heart with unstable angina pectoris ([place])   ?2. Essential hypertension   ?3. Hyperlipidemia LDL goal <70   ?4. Controlled type 2 diabetes mellitus without complication, without long-term current use of insulin ([place])   ?5. Chest pain of uncertain etiology   ? ?PLAN:   ? ?In order of problems listed above: ? ?*** ? ? ?Medication Adjustments/Labs and Tests Ordered: ?Current medicines are reviewed at length with the patient today.  Concerns regarding medicines are outlined above.  ?No orders  of the defined types were placed in this encounter. ? ?No orders of the defined types were placed in this encounter. ? ? ?There are no Patient Instructions on file for this visit.  ? ?Signed, ?Sinclair Grooms, MD  ?09/10/2021 7:08 PM    ?[place] ?

## 2021-09-12 ENCOUNTER — Ambulatory Visit: Payer: PPO | Admitting: Interventional Cardiology

## 2021-09-12 DIAGNOSIS — E119 Type 2 diabetes mellitus without complications: Secondary | ICD-10-CM

## 2021-09-12 DIAGNOSIS — E785 Hyperlipidemia, unspecified: Secondary | ICD-10-CM

## 2021-09-12 DIAGNOSIS — R079 Chest pain, unspecified: Secondary | ICD-10-CM

## 2021-09-12 DIAGNOSIS — I10 Essential (primary) hypertension: Secondary | ICD-10-CM

## 2021-09-12 DIAGNOSIS — I25110 Atherosclerotic heart disease of native coronary artery with unstable angina pectoris: Secondary | ICD-10-CM

## 2021-09-13 ENCOUNTER — Ambulatory Visit: Payer: PPO | Admitting: Interventional Cardiology

## 2021-09-19 DIAGNOSIS — M5416 Radiculopathy, lumbar region: Secondary | ICD-10-CM | POA: Diagnosis not present

## 2021-09-20 ENCOUNTER — Telehealth: Payer: Self-pay | Admitting: Interventional Cardiology

## 2021-09-20 NOTE — Telephone Encounter (Signed)
Pt c/o medication issue: ? ?1. Name of Medication:  ?Gabapentin ? ?2. How are you currently taking this medication (dosage and times per day)?  ? ?3. Are you having a reaction (difficulty breathing--STAT)?  ? ?4. What is your medication issue?  ? ?Patient states she got a nerve block in her back yesterday and she was prescribed Gabapentin. She would like to confirm this will not interfere with her cardiac medications. Please advise. ? ?

## 2021-09-20 NOTE — Telephone Encounter (Signed)
Gabapentin doesn't interact with any of her cardiac meds. It does interact with her clonazepam though as both are CNS depressants (should monitor for confusion, headache, dizziness, dry mouth, etc). ?

## 2021-09-20 NOTE — Telephone Encounter (Signed)
Pt aware of PharmD's recommendations about gabapentin. ?Pt verbalized understanding and agrees with this plan. ?Pt was more than gracious for all the assistance provided.  ?

## 2021-10-20 NOTE — Progress Notes (Deleted)
Cardiology Office Note:    Date:  10/20/2021   ID:  [name], DOB [birth year], MRN [id number]  PCP:  [name], DO   [facility] Providers Cardiologist:  Sinclair Grooms, MD { Click to update primary MD,subspecialty MD or APP then REFRESH:1}    Referring MD: [name], DO   Chief Complaint: ***  History of Present Illness:    [name] is a *** 71 y.o. female with a hx of hypertension, obesity, hyperlipidemia, diabetes, CAD s/p DES to LCx  She established care in 2017 for palpitations and a feeling of dyspnea.  Experiencing symptoms of tachycardia especially when she lies down at night that lasts 10 to 15 minutes.  Cardiac monitor and echo were ordered and subsequently canceled by the patient due to cessation of symptoms.  She returned in June 2021 for episodes of substernal chest pain lasting up to 20 minutes, also occurring at rest usually resolving gradually. Cardiac catheterization 12/2019 with DES to LCx, widely patent lM, LAD contains mid eccentric 50% stenosis, mildly reduced LVEF 50-55%, with intolerance to Brilinta due to dyspnea and bruising.  She was switched to Plavix and continued aspirin.  Had small radial artery that required femoral approach with late femoral bleed.   She was last seen in our office 02/18/2021 by Dr. Tamala Julian at which time she was changed from Plavix to aspirin alone and 1 year follow-up was recommended. Called our office 07/17/21 with concern for irregular heart rhythm. Cardiac monitor revealed SR with average HR 66 bpm, brief nonsustained SVT, longest run 10 beats, max heart rate 160 bpm, no atrial fibrillation, no pauses.   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  Discussed as is on low dose, would be reasonable to discontinue and recheck in 6-8 weeks which she would like to do. Last Assessment & Plan:  States was started after a partial thyroidectomy but does not recall any abnormal lab value and no symptoms.  Discussed as    Lumbar spondylosis 07/09/2017   Memory disorder 10/30/2015   Obesity, morbid, BMI 40.0-49.9 ([place]) 06/02/2014   Osteoporosis 05/02/2013   Overview:  DEXA 11/14 DEXA 11/14   Restless legs syndrome 05/25/2012   Last Assessment & Plan:  patient reports using clonazepam about once weekly .  Discussed nonpharmacologic tips for treating RLS.  States other medicatiosn she had treid had not been effective (does not remember name) Will refill for now.  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   URI, acute 09/20/2015   Vitamin B 12 deficiency    Vitamin D deficiency 05/25/2012    Past Surgical History:  Procedure Laterality Date   ABDOMINAL HYSTERECTOMY     APPENDECTOMY  CARDIAC CATHETERIZATION  12/23/2019   CATARACT EXTRACTION     CHOLECYSTECTOMY     CORONARY STENT INTERVENTION N/A 12/23/2019   Procedure: CORONARY STENT INTERVENTION;  Surgeon: Belva Crome, MD;  Location: [facility];  Service: Cardiovascular;  Laterality: N/A;   JOINT REPLACEMENT     2 total knee replacement   LASIK     LEFT HEART CATH AND CORONARY ANGIOGRAPHY N/A 12/23/2019   Procedure: LEFT HEART CATH AND CORONARY ANGIOGRAPHY;  Surgeon: Belva Crome, MD;  Location: [facility];  Service: Cardiovascular;  Laterality: N/A;   REVERSE SHOULDER  ARTHROPLASTY Right 04/03/2020   Procedure: REVERSE SHOULDER ARTHROPLASTY;  Surgeon: Nicholes Stairs, MD;  Location: [facility];  Service: Orthopedics;  Laterality: Right;  2.5 hrs   SHOULDER ARTHROSCOPY WITH ROTATOR CUFF REPAIR AND SUBACROMIAL DECOMPRESSION Left 10/25/2020   Procedure: Left shoulder arthroscopic, extensive debridement, subacromial decompression;  Surgeon: Nicholes Stairs, MD;  Location: [facility];  Service: Orthopedics;  Laterality: Left;  90 mins   THYROIDECTOMY     TONSILLECTOMY      Current Medications: No outpatient medications have been marked as taking for the 10/22/21 encounter (Appointment) with Ann Maki, Lanice Schwab, NP.     Allergies:   Patient has no known allergies.   Social History   Socioeconomic History   Marital status: Divorced    Spouse name: Not on file   Number of children: Not on file   Years of education: Not on file   Highest education level: Not on file  Occupational History   Not on file  Tobacco Use   Smoking status: Former    Types: Cigarettes    Quit date: 02/17/1987    Years since quitting: 34.6   Smokeless tobacco: Never  Vaping Use   Vaping Use: Never used  Substance and Sexual Activity   Alcohol use: No   Drug use: No   Sexual activity: Not Currently    Birth control/protection: Post-menopausal, Surgical    Comment: Hysterectomy  Other Topics Concern   Not on file  Social History Narrative   Lives at home alone   Right-handed   Social Determinants of Health   Financial Resource Strain: Not on file  Food Insecurity: Not on file  Transportation Needs: Not on file  Physical Activity: Not on file  Stress: Not on file  Social Connections: Not on file     Family History: The patient's ***family history includes Cancer in her father and mother; Diabetes in her brother, brother, and sister; Heart disease in her brother; Hypertension in her mother. There is no history of Breast cancer.  ROS:   Please see the history of  present illness.    *** All other systems reviewed and are negative.  Labs/Other Studies Reviewed:    The following studies were reviewed today:   Cardiac monitor 08/19/21  The basic underlying rhythm is normal sinus rhythm with average heart rate 66 bpm. Range 47 bpm to 160 bpm. Brief nonsustained SVT with longest run of 10 beats. Max heart rate 160 bpm. Rare PACs and PVCs, both with burden less than 1%. No atrial fibrillation no pauses.   No atrial fibrillation or sustained arrhythmias identified.    99% large first diagonal treated with 2.75 x 15 Onyx postdilated to 3.0 mm and 0% stenosis. Left main is widely patent LAD contains mid eccentric 50% stenosis. Circumflex gives 1 large tortuous obtuse marginal and is free of disease. Dominant with luminal irregularities in the  mid body.  No significant obstruction. Very mild mid anterior wall hypokinesis.  EF 50 to 55% which is mildly reduced for female.  LVEDP is 19 mmHg.   RECOMMENDATIONS:   Dual antiplatelet therapy for at least 6 months.  Initially aspirin and Brilinta.  After 4 to 6 weeks Brilinta could be decreased in intensity to clopidogrel plus aspirin. High intensity statin therapy added. Low-dose beta-blocker therapy added. Overnight observation since we have both radial and femoral access during the procedure   Echo 06/11/21  Left Ventricle: Left ventricular ejection fraction, by estimation, is 65  to 70%. The left ventricle has normal function. The left ventricle has no  regional wall motion abnormalities. The left ventricular internal cavity  size was normal in size. There is   borderline left ventricular hypertrophy. Left ventricular diastolic  parameters were normal.  Right Ventricle: The right ventricular size is normal. No increase in  right ventricular wall thickness. Right ventricular systolic function is  normal. Tricuspid regurgitation signal is inadequate for assessing PA  pressure.  Left Atrium: Left  atrial size was normal in size.  Right Atrium: Right atrial size was normal in size.  Pericardium: There is no evidence of pericardial effusion. Presence of  epicardial fat layer.  Mitral Valve: The mitral valve is grossly normal. No evidence of mitral  valve regurgitation. No evidence of mitral valve stenosis.  Tricuspid Valve: The tricuspid valve is grossly normal. Tricuspid valve  regurgitation is not demonstrated. No evidence of tricuspid stenosis.  Aortic Valve: The aortic valve is grossly normal. Aortic valve  regurgitation is not visualized. No aortic stenosis is present.  Pulmonic Valve: The pulmonic valve was not well visualized. Pulmonic valve  regurgitation is not visualized.  Aorta: The aortic root, ascending aorta and aortic arch are all  structurally normal, with no evidence of dilitation or obstruction.  Venous: The inferior vena cava is normal in size with less than 50%  respiratory variability, suggesting right atrial pressure of 8 mmHg.  IAS/Shunts: The atrial septum is grossly normal.    Myoview 06/12/21  IMPRESSION: 1. No reversible ischemia.  Suspect anterolateral infarct.   2. Normal left ventricular wall motion.   3. Left ventricular ejection fraction greater than 70%   4. Non invasive risk stratification*: Low    Recent Labs: 06/11/2021: ALT 17; BUN 12; Creatinine, Ser 0.80; Hemoglobin 14.1; Magnesium 2.1; Platelets 201; Potassium 4.1; Sodium 138  Recent Lipid Panel    Component Value Date/Time   CHOL 133 02/03/2020 0935   TRIG 75 02/03/2020 0935   HDL 67 02/03/2020 0935   CHOLHDL 2.0 02/03/2020 0935   LDLCALC 51 02/03/2020 0935     Risk Assessment/Calculations:   {Does this patient have ATRIAL FIBRILLATION?:[id number]}       Physical Exam:    VS:  There were no vitals taken for this visit.    Wt Readings from Last 3 Encounters:  06/10/21 273 lb 13 oz (124.2 kg)  02/18/21 273 lb 3.2 oz (123.9 kg)  10/25/20 258 lb (117 kg)     GEN: ***  Well nourished, well developed in no acute distress HEENT: Normal NECK: No JVD; No carotid bruits CARDIAC: ***RRR, no murmurs, rubs, gallops RESPIRATORY:  Clear to auscultation without rales, wheezing or rhonchi  ABDOMEN: Soft, non-tender, non-distended MUSCULOSKELETAL:  No edema; No deformity. *** pedal pulses, ***bilaterally SKIN: Warm and dry NEUROLOGIC:  Alert and oriented x 3 PSYCHIATRIC:  Normal affect   EKG:  EKG is *** ordered today.  The ekg  ordered today demonstrates ***  Diagnoses:    No diagnosis found. Assessment and Plan:     ***          {Are you ordering a CV Procedure (e.g. stress test, cath, DCCV, TEE, etc)?   Press F2        :[id number]}    Medication Adjustments/Labs and Tests Ordered: Current medicines are reviewed at length with the patient today.  Concerns regarding medicines are outlined above.  No orders of the defined types were placed in this encounter.  No orders of the defined types were placed in this encounter.   There are no Patient Instructions on file for this visit.   Signed, Emmaline Life, NP  10/20/2021 1:36 PM    [facility]

## 2021-10-22 ENCOUNTER — Ambulatory Visit: Payer: PPO | Admitting: Nurse Practitioner

## 2021-10-23 ENCOUNTER — Ambulatory Visit: Payer: PPO | Admitting: Family Medicine

## 2021-11-06 DIAGNOSIS — M47816 Spondylosis without myelopathy or radiculopathy, lumbar region: Secondary | ICD-10-CM | POA: Diagnosis not present

## 2021-11-06 DIAGNOSIS — M5136 Other intervertebral disc degeneration, lumbar region: Secondary | ICD-10-CM | POA: Diagnosis not present

## 2021-11-06 DIAGNOSIS — G894 Chronic pain syndrome: Secondary | ICD-10-CM | POA: Diagnosis not present

## 2021-11-09 NOTE — Progress Notes (Unsigned)
Cardiology Office Note:    Date:  11/14/2021   ID:  [name], DOB 10/03/1950, MRN [id number]  PCP:  [name], [facility] Cardiologist:  Sinclair Grooms, MD     Referring MD: [name], DO   Chief Complaint:   History of Present Illness:    [name] is a pleasant 71 y.o. female with a hx of CAD s/p with unstable angina, HTN, chronic HFpEF, diabetes, palpitations, and obesity.  She underwent remote cardiac cath in 2002 with no evidence of CAD. She has been seen intermittently over the past few years by Dr. Tamala Julian for chest pain and palpitations. Family history significant for CAD. She had progressive angina and underwent cardiac catheterization on 12/23/19 which revealed 99% large first diagonal treated with DES, widely patent left main, LAD mid eccentric 50% stenosis, circumflex gives 1 large tortuous obtuse marginal and is free of disease, dominant with luminal irregularities of the mid body, no significant obstruction (?RCA), very mild mid anterior hypokinesis, EF 50-55%, LVEDP 19 mmHg. DAPT therapy advised. She was intolerant of Brilinta due to dyspnea. Was switched to aspirin and Plavix but had bruising that was not acceptable. She has continued single antiplatelet therapy initially with Plavix then switched to aspirin.   She was last seen in our office by Dr. Tamala Julian on 02/18/21 at which time she reported no acute concerns and she was advised to follow-up in 12 months.   She called our office on 07/17/21 with concerns for feelings of heart beating hard in her chest and radiating up in her neck. Cardiac monitor revealed NSR, no atrial fibrillation, no pauses, rare PACs and PVCs both with burden less than 1%, brief nonsustained SVT with longest run 10 beats.   Today, she is here alone. Continues to have palpitations that produce a funny feeling up in neck. Feels like a panic attack is coming on. Sits to rest and symptoms resolve  after  approximately 45 minutes. Occur approximately once per month.  Symptoms are not exacerbated by exertion. No presyncope or syncope. She denies chest pain, shortness of breath, lower extremity edema, fatigue, melena, hematuria, hemoptysis, diaphoresis, weakness, orthopnea, and PND.  She stood during the entire visit due to severe pain when sitting.  She is scheduled for a back injection soon.   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  Discussed as is on low dose, would be reasonable to discontinue and recheck in 6-8 weeks which she would like to do. Last Assessment & Plan:  States was started after a partial thyroidectomy but does not recall any abnormal lab value and no symptoms.  Discussed as    Lumbar spondylosis 07/09/2017   Memory disorder 10/30/2015   Obesity, morbid, BMI 40.0-49.9 ([place]) 06/02/2014   Osteoporosis 05/02/2013   Overview:  DEXA 11/14 DEXA 11/14   Restless legs syndrome 05/25/2012   Last Assessment & Plan:  patient reports using  clonazepam about once weekly .  Discussed nonpharmacologic tips for treating RLS.  States other medicatiosn she had treid had not been effective (does not remember name) Will refill for now.  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   URI, acute 09/20/2015   Vitamin B 12 deficiency    Vitamin D deficiency 05/25/2012    Past  Surgical History:  Procedure Laterality Date   ABDOMINAL HYSTERECTOMY     APPENDECTOMY     CARDIAC CATHETERIZATION  12/23/2019   CATARACT EXTRACTION     CHOLECYSTECTOMY     CORONARY STENT INTERVENTION N/A 12/23/2019   Procedure: CORONARY STENT INTERVENTION;  Surgeon: Belva Crome, MD;  Location: [facility];  Service: Cardiovascular;  Laterality: N/A;   JOINT REPLACEMENT     2 total knee replacement   LASIK     LEFT HEART CATH AND CORONARY ANGIOGRAPHY N/A 12/23/2019   Procedure: LEFT HEART CATH AND CORONARY ANGIOGRAPHY;  Surgeon: Belva Crome, MD;  Location: [facility];  Service: Cardiovascular;  Laterality: N/A;   REVERSE SHOULDER ARTHROPLASTY Right 04/03/2020   Procedure: REVERSE SHOULDER ARTHROPLASTY;  Surgeon: Nicholes Stairs, MD;  Location: [facility];  Service: Orthopedics;  Laterality: Right;  2.5 hrs   SHOULDER ARTHROSCOPY WITH ROTATOR CUFF REPAIR AND SUBACROMIAL DECOMPRESSION Left 10/25/2020   Procedure: Left shoulder arthroscopic, extensive debridement, subacromial decompression;  Surgeon: Nicholes Stairs, MD;  Location: [facility];  Service: Orthopedics;  Laterality: Left;  90 mins   THYROIDECTOMY     TONSILLECTOMY      Current Medications: Current Meds  Medication Sig   aspirin 81 MG EC tablet Take 81 mg by mouth daily.   atorvastatin (LIPITOR) 80 MG tablet TAKE 1 TABLET(80 MG) BY MOUTH DAILY   clonazePAM (KLONOPIN) 1 MG tablet Take 1 mg by mouth at bedtime as needed (restless leg).    losartan (COZAAR) 50 MG tablet Take 1 tablet (50 mg total) by mouth daily.   Multiple Vitamins-Minerals (CENTRUM SILVER 50+WOMEN) TABS Take 1 tablet by mouth daily.   ondansetron (ZOFRAN ODT) 4 MG disintegrating tablet Take 1 tablet (4 mg total) by mouth every 8 (eight) hours as needed.   oxyCODONE-acetaminophen (PERCOCET) 10-325 MG tablet Take 1 tablet by mouth 3 (three) times daily as needed.   pantoprazole (PROTONIX) 40 MG tablet Take 1 tablet (40 mg total) by mouth daily.    [DISCONTINUED] metoprolol succinate (TOPROL-XL) 25 MG 24 hr tablet TAKE 1 TABLET(25 MG) BY MOUTH DAILY     Allergies:   Patient has no known allergies.   Social History   Socioeconomic History   Marital status: Divorced    Spouse name: Not on file   Number of children: Not on file   Years of education: Not on file   Highest education level: Not on file  Occupational History   Not on file  Tobacco Use   Smoking status: Former    Types: Cigarettes    Quit date: 02/17/1987    Years since quitting: 34.7   Smokeless tobacco: Never  Vaping Use   Vaping Use: Never used  Substance and Sexual Activity   Alcohol use: No   Drug use: No   Sexual activity: Not Currently    Birth control/protection: Post-menopausal, Surgical    Comment: Hysterectomy  Other Topics Concern  Not on file  Social History Narrative   Lives at home alone   Right-handed   Social Determinants of Health   Financial Resource Strain: Not on file  Food Insecurity: Not on file  Transportation Needs: Not on file  Physical Activity: Not on file  Stress: Not on file  Social Connections: Not on file     Family History: The patient's family history includes Cancer in her father and mother; Diabetes in her brother, brother, and sister; Heart disease in her brother; Hypertension in her mother. There is no history of Breast cancer.  ROS:   Please see the history of present illness.    + palpitations All other systems reviewed and are negative.  Labs/Other Studies Reviewed:    The following studies were reviewed today:  Cardiac monitor 08/19/21  The basic underlying rhythm is normal sinus rhythm with average heart rate 66 bpm. Range 47 bpm to 160 bpm. Brief nonsustained SVT with longest run of 10 beats. Max heart rate 160 bpm. Rare PACs and PVCs, both with burden less than 1%. No atrial fibrillation no pauses.   No atrial fibrillation or sustained arrhythmias identified.    LHC 12/23/19  99% large  first diagonal treated with 2.75 x 15 Onyx postdilated to 3.0 mm and 0% stenosis. Left main is widely patent LAD contains mid eccentric 50% stenosis. Circumflex gives 1 large tortuous obtuse marginal and is free of disease. Dominant with luminal irregularities in the mid body.  No significant obstruction. Very mild mid anterior wall hypokinesis.  EF 50 to 55% which is mildly reduced for female.  LVEDP is 19 mmHg.   RECOMMENDATIONS:   Dual antiplatelet therapy for at least 6 months.  Initially aspirin and Brilinta.  After 4 to 6 weeks Brilinta could be decreased in intensity to clopidogrel plus aspirin. High intensity statin therapy added. Low-dose beta-blocker therapy added. Overnight observation since we have both radial and femoral access during the procedure   Echo 05/24/21  Left Ventricle: Left ventricular ejection fraction, by estimation, is 65  to 70%. The left ventricle has normal function. The left ventricle has no  regional wall motion abnormalities. The left ventricular internal cavity  size was normal in size. There is borderline left ventricular hypertrophy.  Left ventricular diastolic parameters were normal.  Right Ventricle: The right ventricular size is normal. No increase in  right ventricular wall thickness. Right ventricular systolic function is  normal. Tricuspid regurgitation signal is inadequate for assessing PA  pressure.  Left Atrium: Left atrial size was normal in size.  Right Atrium: Right atrial size was normal in size.  Pericardium: There is no evidence of pericardial effusion. Presence of  epicardial fat layer.  Mitral Valve: The mitral valve is grossly normal. No evidence of mitral  valve regurgitation. No evidence of mitral valve stenosis.  Tricuspid Valve: The tricuspid valve is grossly normal. Tricuspid valve  regurgitation is not demonstrated. No evidence of tricuspid stenosis.  Aortic Valve: The aortic valve is grossly normal. Aortic valve   regurgitation is not visualized. No aortic stenosis is present.  Pulmonic Valve: The pulmonic valve was not well visualized. Pulmonic valve  regurgitation is not visualized.  Aorta: The aortic root, ascending aorta and aortic arch are all  structurally normal, with no evidence of dilitation or obstruction.  Venous: The inferior vena cava is normal in size with less than 50%  respiratory variability, suggesting right atrial pressure of 8 mmHg.  IAS/Shunts: The atrial septum is grossly normal.  Lexiscan Myoview 06/12/21  IMPRESSION: 1. No reversible ischemia.  Suspect anterolateral infarct.   2. Normal left ventricular wall motion.   3. Left ventricular ejection fraction greater than 70%   4. Non invasive risk stratification*: Low    Recent Labs: 06/11/2021: ALT 17; BUN 12; Creatinine, Ser 0.80; Hemoglobin 14.1; Magnesium 2.1; Platelets 201; Potassium 4.1; Sodium 138  Recent Lipid Panel    Component Value Date/Time   CHOL 133 02/03/2020 0935   TRIG 75 02/03/2020 0935   HDL 67 02/03/2020 0935   CHOLHDL 2.0 02/03/2020 0935   LDLCALC 51 02/03/2020 0935     Risk Assessment/Calculations:       Physical Exam:    VS:  BP 122/80   Pulse 94   Ht _0  (1.549 m)   Wt 281 lb 6.4 oz (127.6 kg)   SpO2 97%   BMI 53.17 kg/m     Wt Readings from Last 3 Encounters:  11/14/21 281 lb 6.4 oz (127.6 kg)  06/10/21 273 lb 13 oz (124.2 kg)  02/18/21 273 lb 3.2 oz (123.9 kg)     GEN: Well developed, obese female in no acute distress HEENT: Normal NECK: No JVD; No carotid bruits CARDIAC: RRR, no murmurs, rubs, gallops RESPIRATORY:  Clear to auscultation without rales, wheezing or rhonchi  ABDOMEN: Soft, non-tender, non-distended MUSCULOSKELETAL:  No edema; No deformity. 2+ pedal pulses, equal bilaterally SKIN: Warm and dry NEUROLOGIC:  Alert and oriented x 3 PSYCHIATRIC:  Normal affect   EKG:  EKG is not ordered today.    Diagnoses:    1. Palpitations   2. Essential  hypertension   3. Coronary artery disease involving native coronary artery of native heart with unstable angina pectoris ([place])   4. Hyperlipidemia LDL goal <70   5. Chronic heart failure with preserved ejection fraction (HFpEF) (H[facility])    Assessment and Plan:     CAD without angina: s/p DES to large first diagonal, LAD contains mid eccentric 50% stenosis, left main widely patent by cath July 2021. No symptoms of angina similar to prior to stent.  No indication for further ischemia evaluation at this time.  No bleeding problems on aspirin.  Palpitations: Cardiac monitor revealed brief sustained SVT with longest run 10 beats, max heart rate 160 bpm, rare PACs and PVCs, no atrial fibrillation . Continues to have episodes of palpitations in her upper chest/throat area approximately 1 time per month.  Described as feeling like she might be having a panic attack.  Symptoms last for about 45 minutes and resolve with rest.  We will increase metoprolol succinate to 50 mg daily.  We will check TSH and complete metabolic panel today for any abnormality but that may be contributing. Advised her to monitor HR and BP and report back to [place] if symptoms do not improve.  Hypertension: BP is well controlled today.  She does not monitor at home but has a cuff. I have asked her to monitor HR and BP and report to [place] in a few weeks due to increase of metoprolol.   Chronic HFpEF: LVEF 29-93%, normal diastolic parameters.  Body habitus makes it difficult to assess for volume overload, however she does not have symptoms of dyspnea, edema, orthopnea, and PND.  Continue losartan, metoprolol.  Hyperlipidemia LDL goal < 70: 57 on 09/11/20.  We will recheck today.  Continue atorvastatin.  Disposition: 1 year with Dr. Tamala Julian     Medication Adjustments/Labs and Tests Ordered: Current medicines are reviewed at length with the  patient today.  Concerns regarding medicines are outlined above.  Orders Placed This Encounter   Procedures   TSH   Lipid Profile   Comp Met (CMET)   Meds ordered this encounter  Medications   metoprolol succinate (TOPROL-XL) 50 MG 24 hr tablet    Sig: Take 1 tablet (50 mg total) by mouth daily.    Dispense:  90 tablet    Refill:  3    Patient Instructions  Medication Instructions:   INCREASE Toprol two (2) tablet by mouth ( 50 mg) daily.   *If you need a refill on your cardiac medications before your next appointment, please call your pharmacy*   Lab Work:  TODAY!!!!  TSH/LIPID/CMET  If you have labs (blood work) drawn today and your tests are completely normal, you will receive your results only by: [place] (if you have MyChart) OR A paper copy in the mail If you have any lab test that is abnormal or we need to change your treatment, we will call you to review the results.   Testing/Procedures:   None ordered.    Follow-Up: At [facility], you and your health needs are our priority.  As part of our continuing mission to provide you with exceptional heart care, we have created designated Provider Care Teams.  These Care Teams include your primary Cardiologist (physician) and Advanced Practice Providers (APPs -  Physician Assistants and Nurse Practitioners) who all work together to provide you with the care you need, when you need it.  We recommend signing up for the patient portal called "MyChart".  Sign up information is provided on this After Visit Summary.  MyChart is used to connect with patients for Virtual Visits (Telemedicine).  Patients are able to view lab/test results, encounter notes, upcoming appointments, etc.  Non-urgent messages can be sent to your provider as well.   To learn more about what you can do with MyChart, go to [email or web address].    Your next appointment:   1 year(s)  The format for your next appointment:   In Person  Provider:   Sinclair Grooms, MD     Other Instructions  Your physician wants you to  follow-up in: 1 year with Dr. Tamala Julian.  You will receive a reminder letter in the mail two months in advance. If you don't receive a letter, please call our office to schedule the follow-up appointment.  HOW TO TAKE YOUR BLOOD PRESSURE: Rest 5 minutes before taking your blood pressure.  Don't smoke or drink caffeinated beverages for at least 30 minutes before. Take your blood pressure before (not after) you eat. Sit comfortably with your back supported and both feet on the floor (don't cross your legs). Elevate your arm to heart level on a table or a desk. Use the proper sized cuff. It should fit smoothly and snugly around your bare upper arm. There should be enough room to slip a fingertip under the cuff. The bottom edge of the cuff should be 1 inch above the crease of the elbow.  Blood Pressure Record Sheet To take your blood pressure, you will need a blood pressure machine. You may be prescribed one, or you can buy a blood pressure machine (blood pressure monitor) at your clinic, drug store, or online. When choosing one, look for these features: An automatic monitor that has an arm cuff. A cuff that wraps snugly, but not too tightly, around your upper arm. You should be able to fit only one finger between your  arm and the cuff. A device that stores blood pressure reading results. Do not choose a monitor that measures your blood pressure from your wrist or finger. Follow your health care provider's instructions for how to take your blood pressure. To use this form: Get one reading in the morning (a.m.) before you take any medicines. Get one reading in the evening (p.m.) before supper. Take at least two readings with each blood pressure check. This makes sure the results are correct. Wait 1-2 minutes between measurements. Write down the results in the spaces on this form. Repeat this once a week, or as told by your health care provider. Make a follow-up appointment with your health care provider  to discuss the results. Blood pressure log Date: _______________________ a.m. _____________________(1st reading) _____________________(2nd reading) p.m. _____________________(1st reading) _____________________(2nd reading) Date: _______________________ a.m. _____________________(1st reading) _____________________(2nd reading) p.m. _____________________(1st reading) _____________________(2nd reading) Date: _______________________ a.m. _____________________(1st reading) _____________________(2nd reading) p.m. _____________________(1st reading) _____________________(2nd reading) Date: _______________________ a.m. _____________________(1st reading) _____________________(2nd reading) p.m. _____________________(1st reading) _____________________(2nd reading) Date: _______________________ a.m. _____________________(1st reading) _____________________(2nd reading) p.m. _____________________(1st reading) _____________________(2nd reading) This information is not intended to replace advice given to you by your health care provider. Make sure you discuss any questions you have with your health care provider. Document Revised: 02/21/2021 Document Reviewed: 02/21/2021 Elsevier Patient Education  [place].  Please send in readings with BP and Heart Rate X 2 weeks either mychart or call [phone].  Mediterranean Diet A Mediterranean diet refers to food and lifestyle choices that are based on the traditions of countries located on the [employer]. It focuses on eating more fruits, vegetables, whole grains, beans, nuts, seeds, and heart-healthy fats, and eating less dairy, meat, eggs, and processed foods with added sugar, salt, and fat. This way of eating has been shown to help prevent certain conditions and improve outcomes for people who have chronic diseases, like kidney disease and heart disease. What are tips for following this plan? Reading food labels Check the serving size of  packaged foods. For foods such as rice and pasta, the serving size refers to the amount of cooked product, not dry. Check the total fat in packaged foods. Avoid foods that have saturated fat or trans fats. Check the ingredient list for added sugars, such as corn syrup. Shopping  Buy a variety of foods that offer a balanced diet, including: Fresh fruits and vegetables (produce). Grains, beans, nuts, and seeds. Some of these may be available in unpackaged forms or large amounts (in bulk). Fresh seafood. Poultry and eggs. Low-fat dairy products. Buy whole ingredients instead of prepackaged foods. Buy fresh fruits and vegetables in-season from local farmers markets. Buy plain frozen fruits and vegetables. If you do not have access to quality fresh seafood, buy precooked frozen shrimp or canned fish, such as tuna, salmon, or sardines. Stock your pantry so you always have certain foods on hand, such as olive oil, canned tuna, canned tomatoes, rice, pasta, and beans. Cooking Cook foods with extra-virgin olive oil instead of using butter or other vegetable oils. Have meat as a side dish, and have vegetables or grains as your main dish. This means having meat in small portions or adding small amounts of meat to foods like pasta or stew. Use beans or vegetables instead of meat in common dishes like chili or lasagna. Experiment with different cooking methods. Try roasting, broiling, steaming, and sauting vegetables. Add frozen vegetables to soups, stews, pasta, or rice. Add nuts or seeds for added healthy fats and  plant protein at each meal. You can add these to yogurt, salads, or vegetable dishes. Marinate fish or vegetables using olive oil, lemon juice, garlic, and fresh herbs. Meal planning Plan to eat one vegetarian meal one day each week. Try to work up to two vegetarian meals, if possible. Eat seafood two or more times a week. Have healthy snacks readily available, such as: Vegetable sticks  with hummus. Greek yogurt. Fruit and nut trail mix. Eat balanced meals throughout the week. This includes: Fruit: 2-3 servings a day. Vegetables: 4-5 servings a day. Low-fat dairy: 2 servings a day. Fish, poultry, or lean meat: 1 serving a day. Beans and legumes: 2 or more servings a week. Nuts and seeds: 1-2 servings a day. Whole grains: 6-8 servings a day. Extra-virgin olive oil: 3-4 servings a day. Limit red meat and sweets to only a few servings a month. Lifestyle  Cook and eat meals together with your family, when possible. Drink enough fluid to keep your urine pale yellow. Be physically active every day. This includes: Aerobic exercise like running or swimming. Leisure activities like gardening, walking, or housework. Get 7-8 hours of sleep each night. If recommended by your health care provider, drink red wine in moderation. This means 1 glass a day for nonpregnant women and 2 glasses a day for men. A glass of wine equals 5 oz (150 mL). What foods should I eat? Fruits Apples. Apricots. Avocado. Berries. Bananas. Cherries. Dates. Figs. Grapes. Lemons. Melon. Oranges. Peaches. Plums. Pomegranate. Vegetables Artichokes. Beets. Broccoli. Cabbage. Carrots. Eggplant. Green beans. Chard. Kale. Spinach. Onions. Leeks. Peas. Squash. Tomatoes. Peppers. Radishes. Grains Whole-grain pasta. Brown rice. Bulgur wheat. Polenta. Couscous. Whole-wheat bread. [place]. Meats and other proteins Beans. Almonds. Sunflower seeds. Pine nuts. Peanuts. [place]. Salmon. Scallops. Shrimp. [place]. Tilapia. Clams. Oysters. Eggs. Poultry without skin. Dairy Low-fat milk. Cheese. Greek yogurt. Fats and oils Extra-virgin olive oil. Avocado oil. Grapeseed oil. Beverages Water. Red wine. Herbal tea. Sweets and desserts Greek yogurt with honey. Baked apples. Poached pears. Trail mix. Seasonings and condiments Basil. Cilantro. Coriander. Cumin. Mint. Parsley. Sage. Rosemary. Tarragon. Garlic. Oregano.  Thyme. Pepper. Balsamic vinegar. Tahini. Hummus. Tomato sauce. Olives. Mushrooms. The items listed above may not be a complete list of foods and beverages you can eat. Contact a dietitian for more information. What foods should I limit? This is a list of foods that should be eaten rarely or only on special occasions. Fruits Fruit canned in syrup. Vegetables Deep-fried potatoes (french fries). Grains Prepackaged pasta or rice dishes. Prepackaged cereal with added sugar. Prepackaged snacks with added sugar. Meats and other proteins Beef. Pork. Lamb. Poultry with skin. Hot dogs. Berniece Salines. Dairy Ice cream. Sour cream. Whole milk. Fats and oils Butter. Canola oil. Vegetable oil. Beef fat (tallow). Lard. Beverages Juice. Sugar-sweetened soft drinks. Beer. Liquor and spirits. Sweets and desserts Cookies. Cakes. Pies. Candy. Seasonings and condiments Mayonnaise. Pre-made sauces and marinades. The items listed above may not be a complete list of foods and beverages you should limit. Contact a dietitian for more information. Summary The Mediterranean diet includes both food and lifestyle choices. Eat a variety of fresh fruits and vegetables, beans, nuts, seeds, and whole grains. Limit the amount of red meat and sweets that you eat. If recommended by your health care provider, drink red wine in moderation. This means 1 glass a day for nonpregnant women and 2 glasses a day for men. A glass of wine equals 5 oz (150 mL). This information is not intended to replace  advice given to you by your health care provider. Make sure you discuss any questions you have with your health care provider. Document Revised: 07/15/2019 Document Reviewed: 05/12/2019 Elsevier Patient Education  [place]         Signed, Emmaline Life, NP  11/14/2021 5:04 PM    [place]

## 2021-11-14 ENCOUNTER — Ambulatory Visit: Payer: PPO | Admitting: Nurse Practitioner

## 2021-11-14 ENCOUNTER — Encounter: Payer: Self-pay | Admitting: Nurse Practitioner

## 2021-11-14 VITALS — BP 122/80 | HR 94 | Ht 61.0 in | Wt 281.4 lb

## 2021-11-14 DIAGNOSIS — E785 Hyperlipidemia, unspecified: Secondary | ICD-10-CM | POA: Diagnosis not present

## 2021-11-14 DIAGNOSIS — I25110 Atherosclerotic heart disease of native coronary artery with unstable angina pectoris: Secondary | ICD-10-CM

## 2021-11-14 DIAGNOSIS — I10 Essential (primary) hypertension: Secondary | ICD-10-CM | POA: Diagnosis not present

## 2021-11-14 DIAGNOSIS — R002 Palpitations: Secondary | ICD-10-CM

## 2021-11-14 DIAGNOSIS — I5032 Chronic diastolic (congestive) heart failure: Secondary | ICD-10-CM | POA: Diagnosis not present

## 2021-11-14 LAB — COMPREHENSIVE METABOLIC PANEL
ALT: 17 IU/L (ref 0–32)
AST: 18 IU/L (ref 0–40)
Albumin/Globulin Ratio: 1.8 (ref 1.2–2.2)
Albumin: 4.4 g/dL (ref 3.7–4.7)
Alkaline Phosphatase: 141 IU/L — ABNORMAL HIGH (ref 44–121)
BUN/Creatinine Ratio: 16 (ref 12–28)
BUN: 12 mg/dL (ref 8–27)
Bilirubin Total: 1.2 mg/dL (ref 0.0–1.2)
CO2: 22 mmol/L (ref 20–29)
Calcium: 9.3 mg/dL (ref 8.7–10.3)
Chloride: 105 mmol/L (ref 96–106)
Creatinine, Ser: 0.77 mg/dL (ref 0.57–1.00)
Globulin, Total: 2.4 g/dL (ref 1.5–4.5)
Glucose: 136 mg/dL — ABNORMAL HIGH (ref 70–99)
Potassium: 4.7 mmol/L (ref 3.5–5.2)
Sodium: 141 mmol/L (ref 134–144)
Total Protein: 6.8 g/dL (ref 6.0–8.5)
eGFR: 82 mL/min/{1.73_m2} (ref >59)

## 2021-11-14 LAB — LIPID PANEL
Chol/HDL Ratio: 1.9 ratio (ref 0.0–4.4)
Cholesterol, Total: 135 mg/dL (ref 100–199)
HDL: 70 mg/dL (ref >39)
LDL Chol Calc (NIH): 46 mg/dL (ref 0–99)
Triglycerides: 104 mg/dL (ref 0–149)
VLDL Cholesterol Cal: 19 mg/dL (ref 5–40)

## 2021-11-14 LAB — TSH: TSH: 0.801 u[IU]/mL (ref 0.450–4.500)

## 2021-11-14 MED ORDER — METOPROLOL SUCCINATE ER 50 MG PO TB24: 50.0000 mg | ORAL_TABLET | Freq: Every day | ORAL | 3 refills | Status: DC

## 2021-11-14 NOTE — Patient Instructions (Signed)
Medication Instructions:   INCREASE Toprol two (2) tablet by mouth ( 50 mg) daily.   *If you need a refill on your cardiac medications before your next appointment, please call your pharmacy*   Lab Work:  TODAY!!!!  TSH/LIPID/CMET  If you have labs (blood work) drawn today and your tests are completely normal, you will receive your results only by: [place] (if you have MyChart) OR A paper copy in the mail If you have any lab test that is abnormal or we need to change your treatment, we will call you to review the results.   Testing/Procedures:   None ordered.    Follow-Up: At [facility], you and your health needs are our priority.  As part of our continuing mission to provide you with exceptional heart care, we have created designated Provider Care Teams.  These Care Teams include your primary Cardiologist (physician) and Advanced Practice Providers (APPs -  Physician Assistants and Nurse Practitioners) who all work together to provide you with the care you need, when you need it.  We recommend signing up for the patient portal called "MyChart".  Sign up information is provided on this After Visit Summary.  MyChart is used to connect with patients for Virtual Visits (Telemedicine).  Patients are able to view lab/test results, encounter notes, upcoming appointments, etc.  Non-urgent messages can be sent to your provider as well.   To learn more about what you can do with MyChart, go to [email or web address].    Your next appointment:   1 year(s)  The format for your next appointment:   In Person  Provider:   Sinclair Grooms, MD     Other Instructions  Your physician wants you to follow-up in: 1 year with Dr. Tamala Julian.  You will receive a reminder letter in the mail two months in advance. If you don't receive a letter, please call our office to schedule the follow-up appointment.  HOW TO TAKE YOUR BLOOD PRESSURE: Rest 5 minutes before taking your blood  pressure.  Don't smoke or drink caffeinated beverages for at least 30 minutes before. Take your blood pressure before (not after) you eat. Sit comfortably with your back supported and both feet on the floor (don't cross your legs). Elevate your arm to heart level on a table or a desk. Use the proper sized cuff. It should fit smoothly and snugly around your bare upper arm. There should be enough room to slip a fingertip under the cuff. The bottom edge of the cuff should be 1 inch above the crease of the elbow.  Blood Pressure Record Sheet To take your blood pressure, you will need a blood pressure machine. You may be prescribed one, or you can buy a blood pressure machine (blood pressure monitor) at your clinic, drug store, or online. When choosing one, look for these features: An automatic monitor that has an arm cuff. A cuff that wraps snugly, but not too tightly, around your upper arm. You should be able to fit only one finger between your arm and the cuff. A device that stores blood pressure reading results. Do not choose a monitor that measures your blood pressure from your wrist or finger. Follow your health care provider's instructions for how to take your blood pressure. To use this form: Get one reading in the morning (a.m.) before you take any medicines. Get one reading in the evening (p.m.) before supper. Take at least two readings with each blood pressure check. This makes sure the  results are correct. Wait 1-2 minutes between measurements. Write down the results in the spaces on this form. Repeat this once a week, or as told by your health care provider. Make a follow-up appointment with your health care provider to discuss the results. Blood pressure log Date: _______________________ a.m. _____________________(1st reading) _____________________(2nd reading) p.m. _____________________(1st reading) _____________________(2nd reading) Date: _______________________ a.m.  _____________________(1st reading) _____________________(2nd reading) p.m. _____________________(1st reading) _____________________(2nd reading) Date: _______________________ a.m. _____________________(1st reading) _____________________(2nd reading) p.m. _____________________(1st reading) _____________________(2nd reading) Date: _______________________ a.m. _____________________(1st reading) _____________________(2nd reading) p.m. _____________________(1st reading) _____________________(2nd reading) Date: _______________________ a.m. _____________________(1st reading) _____________________(2nd reading) p.m. _____________________(1st reading) _____________________(2nd reading) This information is not intended to replace advice given to you by your health care provider. Make sure you discuss any questions you have with your health care provider. Document Revised: 02/21/2021 Document Reviewed: 02/21/2021 Elsevier Patient Education  [place].  Please send in readings with BP and Heart Rate X 2 weeks either mychart or call [phone].  Mediterranean Diet A Mediterranean diet refers to food and lifestyle choices that are based on the traditions of countries located on the [employer]. It focuses on eating more fruits, vegetables, whole grains, beans, nuts, seeds, and heart-healthy fats, and eating less dairy, meat, eggs, and processed foods with added sugar, salt, and fat. This way of eating has been shown to help prevent certain conditions and improve outcomes for people who have chronic diseases, like kidney disease and heart disease. What are tips for following this plan? Reading food labels Check the serving size of packaged foods. For foods such as rice and pasta, the serving size refers to the amount of cooked product, not dry. Check the total fat in packaged foods. Avoid foods that have saturated fat or trans fats. Check the ingredient list for added sugars, such as corn  syrup. Shopping  Buy a variety of foods that offer a balanced diet, including: Fresh fruits and vegetables (produce). Grains, beans, nuts, and seeds. Some of these may be available in unpackaged forms or large amounts (in bulk). Fresh seafood. Poultry and eggs. Low-fat dairy products. Buy whole ingredients instead of prepackaged foods. Buy fresh fruits and vegetables in-season from local farmers markets. Buy plain frozen fruits and vegetables. If you do not have access to quality fresh seafood, buy precooked frozen shrimp or canned fish, such as tuna, salmon, or sardines. Stock your pantry so you always have certain foods on hand, such as olive oil, canned tuna, canned tomatoes, rice, pasta, and beans. Cooking Cook foods with extra-virgin olive oil instead of using butter or other vegetable oils. Have meat as a side dish, and have vegetables or grains as your main dish. This means having meat in small portions or adding small amounts of meat to foods like pasta or stew. Use beans or vegetables instead of meat in common dishes like chili or lasagna. Experiment with different cooking methods. Try roasting, broiling, steaming, and sauting vegetables. Add frozen vegetables to soups, stews, pasta, or rice. Add nuts or seeds for added healthy fats and plant protein at each meal. You can add these to yogurt, salads, or vegetable dishes. Marinate fish or vegetables using olive oil, lemon juice, garlic, and fresh herbs. Meal planning Plan to eat one vegetarian meal one day each week. Try to work up to two vegetarian meals, if possible. Eat seafood two or more times a week. Have healthy snacks readily available, such as: Vegetable sticks with hummus. Greek yogurt. Fruit and nut trail mix. Eat balanced meals  throughout the week. This includes: Fruit: 2-3 servings a day. Vegetables: 4-5 servings a day. Low-fat dairy: 2 servings a day. Fish, poultry, or lean meat: 1 serving a day. Beans and  legumes: 2 or more servings a week. Nuts and seeds: 1-2 servings a day. Whole grains: 6-8 servings a day. Extra-virgin olive oil: 3-4 servings a day. Limit red meat and sweets to only a few servings a month. Lifestyle  Cook and eat meals together with your family, when possible. Drink enough fluid to keep your urine pale yellow. Be physically active every day. This includes: Aerobic exercise like running or swimming. Leisure activities like gardening, walking, or housework. Get 7-8 hours of sleep each night. If recommended by your health care provider, drink red wine in moderation. This means 1 glass a day for nonpregnant women and 2 glasses a day for men. A glass of wine equals 5 oz (150 mL). What foods should I eat? Fruits Apples. Apricots. Avocado. Berries. Bananas. Cherries. Dates. Figs. Grapes. Lemons. Melon. Oranges. Peaches. Plums. Pomegranate. Vegetables Artichokes. Beets. Broccoli. Cabbage. Carrots. Eggplant. Green beans. Chard. Kale. Spinach. Onions. Leeks. Peas. Squash. Tomatoes. Peppers. Radishes. Grains Whole-grain pasta. Brown rice. Bulgur wheat. Polenta. Couscous. Whole-wheat bread. [place]. Meats and other proteins Beans. Almonds. Sunflower seeds. Pine nuts. Peanuts. [place]. Salmon. Scallops. Shrimp. [place]. Tilapia. Clams. Oysters. Eggs. Poultry without skin. Dairy Low-fat milk. Cheese. Greek yogurt. Fats and oils Extra-virgin olive oil. Avocado oil. Grapeseed oil. Beverages Water. Red wine. Herbal tea. Sweets and desserts Greek yogurt with honey. Baked apples. Poached pears. Trail mix. Seasonings and condiments Basil. Cilantro. Coriander. Cumin. Mint. Parsley. Sage. Rosemary. Tarragon. Garlic. Oregano. Thyme. Pepper. Balsamic vinegar. Tahini. Hummus. Tomato sauce. Olives. Mushrooms. The items listed above may not be a complete list of foods and beverages you can eat. Contact a dietitian for more information. What foods should I limit? This is a list of foods  that should be eaten rarely or only on special occasions. Fruits Fruit canned in syrup. Vegetables Deep-fried potatoes (french fries). Grains Prepackaged pasta or rice dishes. Prepackaged cereal with added sugar. Prepackaged snacks with added sugar. Meats and other proteins Beef. Pork. Lamb. Poultry with skin. Hot dogs. [place] Salines. Dairy Ice cream. Sour cream. Whole milk. Fats and oils Butter. Canola oil. Vegetable oil. Beef fat (tallow). Lard. Beverages Juice. Sugar-sweetened soft drinks. Beer. Liquor and spirits. Sweets and desserts Cookies. Cakes. Pies. Candy. Seasonings and condiments Mayonnaise. Pre-made sauces and marinades. The items listed above may not be a complete list of foods and beverages you should limit. Contact a dietitian for more information. Summary The Mediterranean diet includes both food and lifestyle choices. Eat a variety of fresh fruits and vegetables, beans, nuts, seeds, and whole grains. Limit the amount of red meat and sweets that you eat. If recommended by your health care provider, drink red wine in moderation. This means 1 glass a day for nonpregnant women and 2 glasses a day for men. A glass of wine equals 5 oz (150 mL). This information is not intended to replace advice given to you by your health care provider. Make sure you discuss any questions you have with your health care provider. Document Revised: 07/15/2019 Document Reviewed: 05/12/2019 Elsevier Patient Education  [place]

## 2021-11-21 DIAGNOSIS — Z5181 Encounter for therapeutic drug level monitoring: Secondary | ICD-10-CM | POA: Diagnosis not present

## 2021-11-21 DIAGNOSIS — M47816 Spondylosis without myelopathy or radiculopathy, lumbar region: Secondary | ICD-10-CM | POA: Diagnosis not present

## 2021-11-21 DIAGNOSIS — Z79899 Other long term (current) drug therapy: Secondary | ICD-10-CM | POA: Diagnosis not present

## 2021-12-10 DIAGNOSIS — M47816 Spondylosis without myelopathy or radiculopathy, lumbar region: Secondary | ICD-10-CM | POA: Diagnosis not present

## 2021-12-13 ENCOUNTER — Other Ambulatory Visit: Payer: Self-pay | Admitting: Interventional Cardiology

## 2021-12-18 ENCOUNTER — Telehealth: Payer: Self-pay | Admitting: Interventional Cardiology

## 2021-12-18 ENCOUNTER — Telehealth: Payer: Self-pay | Admitting: Nurse Practitioner

## 2021-12-18 MED ORDER — METOPROLOL SUCCINATE ER 50 MG PO TB24: 50.0000 mg | ORAL_TABLET | Freq: Every day | ORAL | 3 refills | Status: DC

## 2021-12-18 NOTE — Telephone Encounter (Signed)
Pt's medication was sent to pt's pharmacy as requested. Confirmation received.  °

## 2021-12-18 NOTE — Telephone Encounter (Signed)
Pt c/o medication issue:  1. Name of Medication:   metoprolol succinate (TOPROL-XL) 50 MG 24 hr tablet    2. How are you currently taking this medication (dosage and times per day)?   3. Are you having a reaction (difficulty breathing--STAT)? NO  4. What is your medication issue? Pt states that at last visit provider changed instructions on medication to, 2 tablets by mouth daily. Pt says that pharmacy and insurance is saying it's too early to refill due to not having current instruction change. Please advise

## 2021-12-18 NOTE — Telephone Encounter (Signed)
Called pt advised that metoprolol succinate 50 mg was sent to pharmacy on 11/14/21.  Pt reports is currently taking 2 of metoprolol succinate 25 mg daily.  Advised pt that may have attempted to refill 25 mg tablets. Advised to call pharmacy to request 50 mg tablets.  Pt will call pharmacy.  No further concerns voiced.

## 2021-12-18 NOTE — Telephone Encounter (Signed)
Follow Up:     Patient said her Metoprolol was supposed to have been called in on 11-14-21 and it is still not at the pharmacy.      *STAT* If patient is at the pharmacy, call can be transferred to refill team.   1. Which medications need to be refilled? (please list name of each medication and dose if known) New prescription for Metoprolol 50 mg  2. Which pharmacy/location (including street and city if local pharmacy) is medication to be sent to? [facility] and [address] [place]  3. Do they need a 30 day or 90 day supply? 90 days and refills

## 2022-01-02 DIAGNOSIS — K219 Gastro-esophageal reflux disease without esophagitis: Secondary | ICD-10-CM | POA: Diagnosis not present

## 2022-01-02 DIAGNOSIS — Z955 Presence of coronary angioplasty implant and graft: Secondary | ICD-10-CM | POA: Diagnosis not present

## 2022-01-02 DIAGNOSIS — E119 Type 2 diabetes mellitus without complications: Secondary | ICD-10-CM | POA: Diagnosis not present

## 2022-01-02 DIAGNOSIS — R946 Abnormal results of thyroid function studies: Secondary | ICD-10-CM | POA: Diagnosis not present

## 2022-01-02 DIAGNOSIS — I10 Essential (primary) hypertension: Secondary | ICD-10-CM | POA: Diagnosis not present

## 2022-01-02 DIAGNOSIS — E559 Vitamin D deficiency, unspecified: Secondary | ICD-10-CM | POA: Diagnosis not present

## 2022-01-09 DIAGNOSIS — Z23 Encounter for immunization: Secondary | ICD-10-CM | POA: Diagnosis not present

## 2022-01-09 DIAGNOSIS — I10 Essential (primary) hypertension: Secondary | ICD-10-CM | POA: Diagnosis not present

## 2022-01-09 DIAGNOSIS — Z955 Presence of coronary angioplasty implant and graft: Secondary | ICD-10-CM | POA: Diagnosis not present

## 2022-01-09 DIAGNOSIS — Z0000 Encounter for general adult medical examination without abnormal findings: Secondary | ICD-10-CM | POA: Diagnosis not present

## 2022-01-09 DIAGNOSIS — E1159 Type 2 diabetes mellitus with other circulatory complications: Secondary | ICD-10-CM | POA: Diagnosis not present

## 2022-01-09 DIAGNOSIS — M5136 Other intervertebral disc degeneration, lumbar region: Secondary | ICD-10-CM | POA: Diagnosis not present

## 2022-01-09 DIAGNOSIS — E559 Vitamin D deficiency, unspecified: Secondary | ICD-10-CM | POA: Diagnosis not present

## 2022-01-09 DIAGNOSIS — E785 Hyperlipidemia, unspecified: Secondary | ICD-10-CM | POA: Diagnosis not present

## 2022-01-09 DIAGNOSIS — K219 Gastro-esophageal reflux disease without esophagitis: Secondary | ICD-10-CM | POA: Diagnosis not present

## 2022-01-09 DIAGNOSIS — E119 Type 2 diabetes mellitus without complications: Secondary | ICD-10-CM | POA: Diagnosis not present

## 2022-01-09 DIAGNOSIS — M25511 Pain in right shoulder: Secondary | ICD-10-CM | POA: Diagnosis not present

## 2022-01-09 DIAGNOSIS — R946 Abnormal results of thyroid function studies: Secondary | ICD-10-CM | POA: Diagnosis not present

## 2022-01-24 DIAGNOSIS — M47816 Spondylosis without myelopathy or radiculopathy, lumbar region: Secondary | ICD-10-CM | POA: Diagnosis not present

## 2022-02-06 DIAGNOSIS — M533 Sacrococcygeal disorders, not elsewhere classified: Secondary | ICD-10-CM | POA: Diagnosis not present

## 2022-02-06 DIAGNOSIS — M5136 Other intervertebral disc degeneration, lumbar region: Secondary | ICD-10-CM | POA: Diagnosis not present

## 2022-02-20 ENCOUNTER — Ambulatory Visit: Payer: PPO | Attending: Nurse Practitioner | Admitting: Nurse Practitioner

## 2022-02-20 ENCOUNTER — Ambulatory Visit (INDEPENDENT_AMBULATORY_CARE_PROVIDER_SITE_OTHER): Payer: PPO

## 2022-02-20 ENCOUNTER — Encounter: Payer: Self-pay | Admitting: Nurse Practitioner

## 2022-02-20 VITALS — BP 134/78 | HR 70 | Ht 61.0 in | Wt 273.0 lb

## 2022-02-20 DIAGNOSIS — R002 Palpitations: Secondary | ICD-10-CM

## 2022-02-20 DIAGNOSIS — E785 Hyperlipidemia, unspecified: Secondary | ICD-10-CM | POA: Diagnosis not present

## 2022-02-20 DIAGNOSIS — I10 Essential (primary) hypertension: Secondary | ICD-10-CM

## 2022-02-20 DIAGNOSIS — I25110 Atherosclerotic heart disease of native coronary artery with unstable angina pectoris: Secondary | ICD-10-CM

## 2022-02-20 NOTE — Progress Notes (Unsigned)
Enrolled for [employer] to mail a ZIO XT long term holter monitor to the patients address on file.   Dr. Smith to read. 

## 2022-02-20 NOTE — Patient Instructions (Signed)
Medication Instructions:   Your physician recommends that you continue on your current medications as directed. Please refer to the Current Medication list given to you today.  *If you need a refill on your cardiac medications before your next appointment, please call your pharmacy*  Lab Work:  None ordered.  If you have labs (blood work) drawn today and your tests are completely normal, you will receive your results only by: [place] (if you have MyChart) OR A paper copy in the mail If you have any lab test that is abnormal or we need to change your treatment, we will call you to review the results.  Testing/Procedures:  [place]- Long Term Monitor Instructions  Your physician has requested you wear a ZIO patch monitor for 14 days.  This is a single patch monitor. [employer] supplies one patch monitor per enrollment. Additional stickers are not available. Please do not apply patch if you will be having a Nuclear Stress Test,  Echocardiogram, Cardiac CT, MRI, or Chest Xray during the period you would be wearing the  monitor. The patch cannot be worn during these tests. You cannot remove and re-apply the  ZIO XT patch monitor.  Your ZIO patch monitor will be mailed 3 day USPS to your address on file. It may take 3-5 days  to receive your monitor after you have been enrolled.  Once you have received your monitor, please review the enclosed instructions. Your monitor  has already been registered assigning a specific monitor serial # to you.  Billing and Patient Assistance Program Information  We have supplied [employer] with any of your insurance information on file for billing purposes. [employer] offers a sliding scale Patient Assistance Program for patients that do not have  insurance, or whose insurance does not completely cover the cost of the ZIO monitor.  You must apply for the Patient Assistance Program to qualify for this discounted rate.  To apply, please call [employer] at  [phone], select option 4, select option 2, ask to apply for  Patient Assistance Program. Theodore Demark will ask your household income, and how many people  are in your household. They will quote your out-of-pocket cost based on that information.  [employer] will also be able to set up a 12-month interest-free payment plan if needed.  Applying the monitor   Shave hair from upper left chest.  Hold abrader disc by orange tab. Rub abrader in 40 strokes over the upper left chest as  indicated in your monitor instructions.  Clean area with 4 enclosed alcohol pads. Let dry.  Apply patch as indicated in monitor instructions. Patch will be placed under collarbone on left  side of chest with arrow pointing upward.  Rub patch adhesive wings for 2 minutes. Remove white label marked "1". Remove the white  label marked "2". Rub patch adhesive wings for 2 additional minutes.  While looking in a mirror, press and release button in center of patch. A small green light will  flash 3-4 times. This will be your only indicator that the monitor has been turned on.  Do not shower for the first 24 hours. You may shower after the first 24 hours.  Press the button if you feel a symptom. You will hear a small click. Record Date, Time and  Symptom in the Patient Logbook.  When you are ready to remove the patch, follow instructions on the last 2 pages of Patient  Logbook. Stick patch monitor onto the last page of Patient Logbook.  Place  Patient Logbook in the blue and white box. Use locking tab on box and tape box closed  securely. The blue and white box has prepaid postage on it. Please place it in the mailbox as  soon as possible. Your physician should have your test results approximately 7 days after the  monitor has been mailed back to [facility].  Call [place] at [phone] if you have questions regarding  your ZIO XT patch monitor. Call them immediately if you see an orange light  blinking on your  monitor.  If your monitor falls off in less than 4 days, contact our Monitor department at [phone].  If your monitor becomes loose or falls off after 4 days call [employer] at [phone] for  suggestions on securing your monitor  Follow-Up: At [facility], you and your health needs are our priority.  As part of our continuing mission to provide you with exceptional heart care, we have created designated Provider Care Teams.  These Care Teams include your primary Cardiologist (physician) and Advanced Practice Providers (APPs -  Physician Assistants and Nurse Practitioners) who all work together to provide you with the care you need, when you need it.  We recommend signing up for the patient portal called "MyChart".  Sign up information is provided on this After Visit Summary.  MyChart is used to connect with patients for Virtual Visits (Telemedicine).  Patients are able to view lab/test results, encounter notes, upcoming appointments, etc.  Non-urgent messages can be sent to your provider as well.   To learn more about what you can do with MyChart, go to [email or web address].    Your next appointment:   3 month(s)  The format for your next appointment:   In Person  Provider:   Sinclair Grooms, MD    Important Information About Sugar

## 2022-02-20 NOTE — Progress Notes (Signed)
Cardiology Office Note:    Date:  02/20/2022   ID:  [name], DOB [birth year], MRN [id number]  PCP:  [name], [place] [facility] Cardiologist:  Sinclair Grooms, MD     Referring MD: [name], DO   Chief Complaint: palpitations and dizziness  History of Present Illness:    [name] is a pleasant 71 y.o. female with a hx of CAD s/p with unstable angina, HTN, chronic HFpEF, diabetes, palpitations, and obesity.  She underwent remote cardiac cath in 2002 with no evidence of CAD. She has been seen intermittently over the past few years by Dr. Tamala Julian for chest pain and palpitations. Family history significant for CAD. She had progressive angina and underwent cardiac catheterization on 12/23/19 which revealed 99% large first diagonal treated with DES, widely patent left main, LAD mid eccentric 50% stenosis, circumflex gives 1 large tortuous obtuse marginal and is free of disease, dominant with luminal irregularities of the mid body, no significant obstruction (?RCA), very mild mid anterior hypokinesis, EF 50-55%, LVEDP 19 mmHg. DAPT therapy advised. She was intolerant of Brilinta due to dyspnea. Was switched to aspirin and Plavix but had bruising that was not acceptable. She has continued single antiplatelet therapy initially with Plavix then switched to aspirin.   She was last seen in our office by Dr. Tamala Julian on 02/18/21 at which time she reported no acute concerns and she was advised to follow-up in 12 months.   She called our office on 07/17/21 with concerns for feelings of heart beating hard in her chest and radiating up in her neck. Cardiac monitor revealed NSR, no atrial fibrillation, no pauses, rare PACs and PVCs both with burden less than 1%, brief nonsustained SVT with longest run 10 beats.   Her last office visit was with me on 11/14/21 for palpitations. Palpitations produce a funny feeling up in neck. Feels like a panic attack is coming on. Sits to  rest and symptoms resolve  after approximately 45 minutes. Occurring approximately once per month.  Symptoms not exacerbated by exertion. No presyncope or syncope. No chest pain, shortness of breath, lower extremity edema, fatigue, melena, hematuria, hemoptysis, diaphoresis, weakness, orthopnea, and PND.  She stood during the entire visit due to severe pain when sitting.  She is scheduled for a back injection soon. Advised her to increase metoprolol to 50 mg daily and to monitor HR and BP and report back to [place]. TSH was checked and was WNL. Alkaline phosphatase was mildly elevated and she was advised to f/u with PCP for potential causes.  Today, she is here with her daughter for evaluation of palpitations and dizziness.  Reports that when she wakes up in the morning and moves around she starts to feel very lightheaded.  Additionally has funny feelings in her chest that feel like a flutter. They have been persistent over the last 4 days.  On one occasion 2 days ago she became presyncopal. Stopped Ozempic 8/19 - took 3 doses total and was concerned about side effects. Wonders if her blood sugar got too low, however symptoms have persisted.  Symptoms last from a few seconds to several minutes.  She denies chest pain, shortness of breath, syncope, PND, orthopnea, edema.  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  Discussed as is on low dose, would be reasonable to discontinue and recheck in 6-8 weeks which she would  like to do. Last Assessment & Plan:  States was started after a partial thyroidectomy but does not recall any abnormal lab value and no symptoms.  Discussed as    Lumbar spondylosis 07/09/2017   Memory disorder 10/30/2015   Obesity, morbid, BMI 40.0-49.9 ([place]) 06/02/2014   Osteoporosis 05/02/2013   Overview:  DEXA 11/14 DEXA 11/14   Restless legs syndrome 05/25/2012   Last Assessment & Plan:  patient reports using clonazepam about once weekly .  Discussed nonpharmacologic tips for treating RLS.  States other medicatiosn she had treid had not been effective (does not remember name) Will refill for now.  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   URI, acute 09/20/2015   Vitamin B 12 deficiency    Vitamin D deficiency 05/25/2012    Past Surgical History:  Procedure Laterality Date   ABDOMINAL HYSTERECTOMY     APPENDECTOMY     CARDIAC CATHETERIZATION  12/23/2019   CATARACT EXTRACTION     CHOLECYSTECTOMY     CORONARY STENT INTERVENTION N/A 12/23/2019   Procedure: CORONARY STENT INTERVENTION;  Surgeon: Belva Crome, MD;  Location: [facility];  Service: Cardiovascular;  Laterality: N/A;   JOINT REPLACEMENT     2 total knee replacement   LASIK     LEFT HEART CATH AND CORONARY ANGIOGRAPHY N/A 12/23/2019   Procedure: LEFT HEART CATH AND CORONARY ANGIOGRAPHY;  Surgeon: Belva Crome, MD;  Location: [facility];  Service: Cardiovascular;  Laterality: N/A;   REVERSE SHOULDER ARTHROPLASTY Right 04/03/2020   Procedure: REVERSE SHOULDER ARTHROPLASTY;  Surgeon: Nicholes Stairs, MD;  Location: [facility];  Service: Orthopedics;  Laterality: Right;  2.5 hrs   SHOULDER ARTHROSCOPY WITH ROTATOR CUFF REPAIR AND SUBACROMIAL DECOMPRESSION Left 10/25/2020   Procedure: Left shoulder arthroscopic, extensive debridement, subacromial decompression;  Surgeon: Nicholes Stairs, MD;  Location: [facility];  Service: Orthopedics;  Laterality: Left;  90 mins   THYROIDECTOMY     TONSILLECTOMY      Current Medications: Current Meds  Medication Sig   aspirin 81 MG EC tablet Take 81 mg by mouth daily.   atorvastatin (LIPITOR) 80 MG tablet TAKE 1 TABLET(80 MG) BY MOUTH DAILY   busPIRone (BUSPAR) 5 MG tablet TAKE 1 TABLET BY MOUTH EVERY DAY IN THE MORNING FOR ANXIETY   clonazePAM (KLONOPIN) 1 MG tablet Take 1 mg by mouth at bedtime as needed (restless leg).    HYDROcodone-acetaminophen (NORCO) 10-325 MG tablet Take 1 tablet by mouth 4 (four) times daily as needed.   losartan (COZAAR) 50 MG tablet Take 1 tablet (50 mg total) by mouth daily.   metoprolol succinate (TOPROL-XL) 50 MG 24 hr tablet Take 1 tablet (50 mg total) by mouth daily.   Multiple Vitamins-Minerals (CENTRUM SILVER 50+WOMEN) TABS Take 1 tablet by mouth daily.   ondansetron (ZOFRAN ODT) 4 MG disintegrating  tablet Take 1 tablet (4 mg total) by mouth every 8 (eight) hours as needed.   oxyCODONE-acetaminophen (PERCOCET) 10-325 MG tablet Take 1 tablet by mouth 3 (three) times daily as needed.   pantoprazole (PROTONIX) 40 MG tablet TAKE 1 TABLET(40 MG) BY MOUTH DAILY     Allergies:   Patient has no known allergies.   Social History   Socioeconomic History   Marital status: Divorced    Spouse name: Not on file   Number of children: Not on file   Years of education: Not on file   Highest education level: Not on file  Occupational History   Not on file  Tobacco Use   Smoking status: Former    Types: Cigarettes    Quit date: 02/17/1987    Years since quitting: 35.0   Smokeless tobacco: Never  Vaping Use   Vaping Use: Never used  Substance and Sexual Activity   Alcohol use: No   Drug use: No   Sexual activity: Not Currently    Birth control/protection: Post-menopausal, Surgical    Comment: Hysterectomy  Other Topics Concern   Not on file  Social History Narrative   Lives at home alone    Right-handed   Social Determinants of Health   Financial Resource Strain: Not on file  Food Insecurity: Not on file  Transportation Needs: Not on file  Physical Activity: Not on file  Stress: Not on file  Social Connections: Not on file     Family History: The patient's family history includes Cancer in her father and mother; Diabetes in her brother, brother, and sister; Heart disease in her brother; Hypertension in her mother. There is no history of Breast cancer.  ROS:   Please see the history of present illness.    + palpitations + lightheadedness All other systems reviewed and are negative.  Labs/Other Studies Reviewed:    The following studies were reviewed today:  Cardiac monitor 08/19/21  The basic underlying rhythm is normal sinus rhythm with average heart rate 66 bpm. Range 47 bpm to 160 bpm. Brief nonsustained SVT with longest run of 10 beats. Max heart rate 160 bpm. Rare PACs and PVCs, both with burden less than 1%. No atrial fibrillation no pauses.   No atrial fibrillation or sustained arrhythmias identified.    LHC 12/23/19  99% large first diagonal treated with 2.75 x 15 Onyx postdilated to 3.0 mm and 0% stenosis. Left main is widely patent LAD contains mid eccentric 50% stenosis. Circumflex gives 1 large tortuous obtuse marginal and is free of disease. Dominant with luminal irregularities in the mid body.  No significant obstruction. Very mild mid anterior wall hypokinesis.  EF 50 to 55% which is mildly reduced for female.  LVEDP is 19 mmHg.   RECOMMENDATIONS:   Dual antiplatelet therapy for at least 6 months.  Initially aspirin and Brilinta.  After 4 to 6 weeks Brilinta could be decreased in intensity to clopidogrel plus aspirin. High intensity statin therapy added. Low-dose beta-blocker therapy added. Overnight observation since we have both radial and femoral access during the procedure   Echo 05/24/21  Left Ventricle: Left ventricular ejection  fraction, by estimation, is 65  to 70%. The left ventricle has normal function. The left ventricle has no  regional wall motion abnormalities. The left ventricular internal cavity  size was normal in size. There is borderline left ventricular hypertrophy.  Left ventricular diastolic parameters were normal.  Right Ventricle: The right ventricular size is normal. No increase in  right ventricular wall thickness. Right ventricular systolic function is  normal. Tricuspid regurgitation signal is inadequate for assessing PA  pressure.  Left Atrium: Left atrial size was normal in size.  Right Atrium: Right atrial size was normal in size.  Pericardium: There is no evidence of pericardial effusion. Presence of  epicardial fat layer.  Mitral Valve: The mitral valve is grossly normal. No evidence of mitral  valve regurgitation. No evidence of mitral valve stenosis.  Tricuspid Valve: The tricuspid valve is grossly normal. Tricuspid valve  regurgitation is not demonstrated. No evidence of tricuspid stenosis.  Aortic Valve: The aortic valve is grossly normal. Aortic valve  regurgitation is not visualized. No aortic stenosis is present.  Pulmonic Valve: The pulmonic valve was not well visualized. Pulmonic valve  regurgitation is not visualized.  Aorta: The aortic root, ascending aorta and aortic arch are all  structurally normal, with no evidence of dilitation or obstruction.  Venous: The inferior vena cava is normal in size with less than 50%  respiratory variability, suggesting right atrial pressure of 8 mmHg.  IAS/Shunts: The atrial septum is grossly normal.    Lexiscan Myoview 06/12/21  IMPRESSION: 1. No reversible ischemia.  Suspect anterolateral infarct.   2. Normal left ventricular wall motion.   3. Left ventricular ejection fraction greater than 70%   4. Non invasive risk stratification*: Low    Recent Labs: 06/11/2021: Hemoglobin 14.1; Magnesium 2.1; Platelets 201 11/14/2021: ALT  17; BUN 12; Creatinine, Ser 0.77; Potassium 4.7; Sodium 141; TSH 0.801  Recent Lipid Panel    Component Value Date/Time   CHOL 135 11/14/2021 0955   TRIG 104 11/14/2021 0955   HDL 70 11/14/2021 0955   CHOLHDL 1.9 11/14/2021 0955   LDLCALC 46 11/14/2021 0955     Risk Assessment/Calculations:       Physical Exam:    VS:  BP 134/78   Pulse 70   Ht '5\' 1"'$  (1.549 m)   Wt 273 lb (123.8 kg)   SpO2 98%   BMI 51.58 kg/m     Wt Readings from Last 3 Encounters:  02/20/22 273 lb (123.8 kg)  11/14/21 281 lb 6.4 oz (127.6 kg)  06/10/21 273 lb 13 oz (124.2 kg)     GEN: Well developed, obese female in no acute distress HEENT: Normal NECK: No JVD; No carotid bruits CARDIAC: RRR, no murmurs, rubs, gallops RESPIRATORY:  Clear to auscultation without rales, wheezing or rhonchi  ABDOMEN: Soft, non-tender, non-distended MUSCULOSKELETAL:  No edema; No deformity. 2+ pedal pulses, equal bilaterally SKIN: Warm and dry NEUROLOGIC:  Alert and oriented x 3 PSYCHIATRIC:  Normal affect   EKG:  EKG is ordered today.  EKG reveals NSR at 70 bpm, nonspecific ST abnormality  Diagnoses:    1. Palpitations   2. Essential hypertension   3. Coronary artery disease involving native coronary artery of native heart with unstable angina pectoris ([place])   4. Hyperlipidemia LDL goal <70     Assessment and Plan:     Lightheadedness: Orthostatic vital signs done today were negative. She reported slight dizziness when standing.  Symptoms are not worse after taking medication.  Symptoms do seem to be associated with palpitations. As noted below, we will place a 14 day Zio monitor for evaluation of arrhythmia.  CAD without angina: s/p DES to large first diagonal, LAD contains mid eccentric 50% stenosis, left main widely patent by cath July 2021. No symptoms of angina similar to prior to stent.  No indication for further ischemia evaluation  at this time.  No bleeding problems on aspirin.  Palpitations: Cardiac  monitor 07/2021 revealed brief sustained SVT with longest run 10 beats, max heart rate 160 bpm, rare PACs and PVCs, no atrial fibrillation. Has felt well on higher dose of metoprolol since last office visit until a few days ago.  States that she feels flutters mid-sternal chest area. These can last from a few seconds to minutes.  They are not exacerbated with exertion.  Feels that lightheaded has worsened in association with timing of these palpitations. No associated SOB, diaphoresis, n/v. We will place a 14 day Zio monitor for evaluation of arrhythmia.   Hypertension: BP is well controlled today. She does not monitor on a consistent basis.   Chronic HFpEF: LVEF 47-42%, normal diastolic parameters.  She denies recent weight gain, edema, orthopnea, dyspnea, and PND.  Is difficult to assess for volume overload due to body habitus. Continue losartan, metoprolol.  Hyperlipidemia LDL goal < 70: LDL 46 on 11/14/21.  Well controlled. Continue atorvastatin.  Disposition: 3-4 months with Dr. Tamala Julian   Medication Adjustments/Labs and Tests Ordered: Current medicines are reviewed at length with the patient today.  Concerns regarding medicines are outlined above.  Orders Placed This Encounter  Procedures   LONG TERM MONITOR (3-14 DAYS)   EKG 12-Lead   No orders of the defined types were placed in this encounter.   Patient Instructions  Medication Instructions:   Your physician recommends that you continue on your current medications as directed. Please refer to the Current Medication list given to you today.  *If you need a refill on your cardiac medications before your next appointment, please call your pharmacy*  Lab Work:  None ordered.  If you have labs (blood work) drawn today and your tests are completely normal, you will receive your results only by: [place] (if you have MyChart) OR A paper copy in the mail If you have any lab test that is abnormal or we need to change your  treatment, we will call you to review the results.  Testing/Procedures:  [facility]- Long Term Monitor Instructions  Your physician has requested you wear a ZIO patch monitor for 14 days.  This is a single patch monitor. [employer] supplies one patch monitor per enrollment. Additional stickers are not available. Please do not apply patch if you will be having a Nuclear Stress Test,  Echocardiogram, Cardiac CT, MRI, or Chest Xray during the period you would be wearing the  monitor. The patch cannot be worn during these tests. You cannot remove and re-apply the  ZIO XT patch monitor.  Your ZIO patch monitor will be mailed 3 day USPS to your address on file. It may take 3-5 days  to receive your monitor after you have been enrolled.  Once you have received your monitor, please review the enclosed instructions. Your monitor  has already been registered assigning a specific monitor serial # to you.  Billing and Patient Assistance Program Information  We have supplied [employer] with any of your insurance information on file for billing purposes. [employer] offers a sliding scale Patient Assistance Program for patients that do not have  insurance, or whose insurance does not completely cover the cost of the ZIO monitor.  You must apply for the Patient Assistance Program to qualify for this discounted rate.  To apply, please call [employer] at [phone], select option 4, select option 2, ask to apply for  Patient Assistance Program. Theodore Demark will ask your household income, and how  many people  are in your household. They will quote your out-of-pocket cost based on that information.  [employer] will also be able to set up a 75-month interest-free payment plan if needed.  Applying the monitor   Shave hair from upper left chest.  Hold abrader disc by orange tab. Rub abrader in 40 strokes over the upper left chest as  indicated in your monitor instructions.  Clean area with 4 enclosed alcohol pads. Let dry.   Apply patch as indicated in monitor instructions. Patch will be placed under collarbone on left  side of chest with arrow pointing upward.  Rub patch adhesive wings for 2 minutes. Remove white label marked "1". Remove the white  label marked "2". Rub patch adhesive wings for 2 additional minutes.  While looking in a mirror, press and release button in center of patch. A small green light will  flash 3-4 times. This will be your only indicator that the monitor has been turned on.  Do not shower for the first 24 hours. You may shower after the first 24 hours.  Press the button if you feel a symptom. You will hear a small click. Record Date, Time and  Symptom in the Patient Logbook.  When you are ready to remove the patch, follow instructions on the last 2 pages of Patient  Logbook. Stick patch monitor onto the last page of Patient Logbook.  Place Patient Logbook in the blue and white box. Use locking tab on box and tape box closed  securely. The blue and white box has prepaid postage on it. Please place it in the mailbox as  soon as possible. Your physician should have your test results approximately 7 days after the  monitor has been mailed back to [facility]  Call [facility] [phone] you have questions regarding  your ZIO XT patch monitor. Call them immediately if you see an orange light blinking on your  monitor.  If your monitor falls off in less than 4 days, contact our Monitor department at [phone]  If your monitor becomes loose or falls off after 4 days call [employer] at [phone]  suggestions on securing your monitor  Follow-Up: [facility] and your [facility] needs are our [facility].  As part of our continuing mission to provide you with exceptional heart care, we have created designated Provider Care Teams.  These Care Teams include your primary Cardiologist (physician) and Advanced Practice Providers (APPs -  Physician  Assistants and Nurse Practitioners) who all work together to provide you with the care you need, when you need it.  We recommend signing up for the patient portal called "MyChart".  Sign up information is provided on this After Visit Summary.  MyChart is used to connect with patients for Virtual Visits (Telemedicine).  Patients are able to view lab/test results, encounter notes, upcoming appointments, etc.  Non-urgent messages can be sent to your provider as well.   To learn more about what you can do with MyChart, go to [email or web address]    Your next appointment:   3 month(s)  The format for your next appointment:   In Person  Provider:   HSinclair Grooms MD    Important Information About Sugar         Signed, SEmmaline Life NP  02/20/2022 5:09 PM    [place]

## 2022-02-22 DIAGNOSIS — I25110 Atherosclerotic heart disease of native coronary artery with unstable angina pectoris: Secondary | ICD-10-CM

## 2022-02-22 DIAGNOSIS — I10 Essential (primary) hypertension: Secondary | ICD-10-CM | POA: Diagnosis not present

## 2022-02-22 DIAGNOSIS — E785 Hyperlipidemia, unspecified: Secondary | ICD-10-CM

## 2022-02-22 DIAGNOSIS — R002 Palpitations: Secondary | ICD-10-CM

## 2022-02-22 DIAGNOSIS — M47816 Spondylosis without myelopathy or radiculopathy, lumbar region: Secondary | ICD-10-CM | POA: Diagnosis not present

## 2022-02-22 DIAGNOSIS — M5136 Other intervertebral disc degeneration, lumbar region: Secondary | ICD-10-CM | POA: Diagnosis not present

## 2022-02-25 ENCOUNTER — Ambulatory Visit: Payer: PPO | Admitting: Physician Assistant

## 2022-03-14 DIAGNOSIS — R002 Palpitations: Secondary | ICD-10-CM | POA: Diagnosis not present

## 2022-03-14 DIAGNOSIS — I10 Essential (primary) hypertension: Secondary | ICD-10-CM | POA: Diagnosis not present

## 2022-03-19 DIAGNOSIS — M5451 Vertebrogenic low back pain: Secondary | ICD-10-CM | POA: Diagnosis not present

## 2022-03-19 DIAGNOSIS — M533 Sacrococcygeal disorders, not elsewhere classified: Secondary | ICD-10-CM | POA: Diagnosis not present

## 2022-04-07 DIAGNOSIS — M533 Sacrococcygeal disorders, not elsewhere classified: Secondary | ICD-10-CM | POA: Diagnosis not present

## 2022-04-11 ENCOUNTER — Ambulatory Visit (INDEPENDENT_AMBULATORY_CARE_PROVIDER_SITE_OTHER): Payer: PPO | Admitting: Nurse Practitioner

## 2022-04-11 ENCOUNTER — Encounter: Payer: Self-pay | Admitting: Nurse Practitioner

## 2022-04-11 VITALS — BP 120/74 | HR 72 | Temp 97.5°F | Ht 61.0 in | Wt 277.0 lb

## 2022-04-11 DIAGNOSIS — I10 Essential (primary) hypertension: Secondary | ICD-10-CM | POA: Diagnosis not present

## 2022-04-11 DIAGNOSIS — M810 Age-related osteoporosis without current pathological fracture: Secondary | ICD-10-CM | POA: Diagnosis not present

## 2022-04-11 DIAGNOSIS — F419 Anxiety disorder, unspecified: Secondary | ICD-10-CM | POA: Diagnosis not present

## 2022-04-11 DIAGNOSIS — Z23 Encounter for immunization: Secondary | ICD-10-CM | POA: Diagnosis not present

## 2022-04-11 DIAGNOSIS — I5032 Chronic diastolic (congestive) heart failure: Secondary | ICD-10-CM | POA: Diagnosis not present

## 2022-04-11 DIAGNOSIS — G2581 Restless legs syndrome: Secondary | ICD-10-CM

## 2022-04-11 DIAGNOSIS — R7303 Prediabetes: Secondary | ICD-10-CM | POA: Diagnosis not present

## 2022-04-11 DIAGNOSIS — G894 Chronic pain syndrome: Secondary | ICD-10-CM | POA: Diagnosis not present

## 2022-04-11 DIAGNOSIS — G4700 Insomnia, unspecified: Secondary | ICD-10-CM | POA: Diagnosis not present

## 2022-04-11 DIAGNOSIS — E785 Hyperlipidemia, unspecified: Secondary | ICD-10-CM | POA: Diagnosis not present

## 2022-04-11 MED ORDER — DOXEPIN HCL 3 MG PO TABS: 3.0000 mg | ORAL_TABLET | Freq: Every evening | ORAL | 2 refills | Status: DC | PRN

## 2022-04-11 MED ORDER — PANTOPRAZOLE SODIUM 40 MG PO TBEC
40.0000 mg | DELAYED_RELEASE_TABLET | Freq: Every day | ORAL | 3 refills | Status: DC
Start: 2022-04-11 — End: 2023-05-04

## 2022-04-11 MED ORDER — BUSPIRONE HCL 5 MG PO TABS: 5.0000 mg | ORAL_TABLET | Freq: Every day | ORAL | 0 refills | Status: DC | PRN

## 2022-04-11 MED ORDER — LOSARTAN POTASSIUM 50 MG PO TABS
50.0000 mg | ORAL_TABLET | Freq: Every day | ORAL | 3 refills | Status: DC
Start: 2022-04-11 — End: 2023-05-04

## 2022-04-11 MED ORDER — ATORVASTATIN CALCIUM 80 MG PO TABS: ORAL_TABLET | ORAL | 3 refills | Status: DC

## 2022-04-11 MED ORDER — METOPROLOL SUCCINATE ER 50 MG PO TB24: 50.0000 mg | ORAL_TABLET | Freq: Every day | ORAL | 3 refills | Status: DC

## 2022-04-11 NOTE — Progress Notes (Unsigned)
New Patient Visit  Pulse 72   Temp (!) 97.5 F (36.4 C) (Oral)   Ht '5\' 1"'$  (1.549 m)   Wt 277 lb (125.6 kg)   SpO2 93%   BMI 52.34 kg/m    Subjective:    Patient ID: [name], female    DOB: 1950/09/28, 71 y.o.   MRN: [id number]  CC: Chief Complaint  Patient presents with   Establish Care    Np. Est care. Overall health assessment. CHM. Pt states she is having trouble sleeping in the pm 2-3 times a week.     HPI: [name] is a 71 y.o. female presents for new patient visit to establish care.  Introduced to Designer, jewellery role and practice setting.  All questions answered.  Discussed provider/patient relationship and expectations.   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  Discussed as is on low dose, would be reasonable to discontinue and recheck in 6-8 weeks which she would like to do. Last Assessment & Plan:  States was started after a partial thyroidectomy but does not recall any abnormal lab value and no symptoms.  Discussed as    Lumbar spondylosis 07/09/2017   Memory disorder 10/30/2015   Obesity, morbid, BMI 40.0-49.9 ([place]) 06/02/2014   Osteoporosis 05/02/2013   Overview:  DEXA 11/14 DEXA 11/14   Prediabetes    Restless legs syndrome 05/25/2012   Last Assessment & Plan:  patient reports using clonazepam about once weekly .  Discussed nonpharmacologic tips for treating RLS.  States other medicatiosn she had treid had not been effective (does not remember name) Will refill for now.  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   URI, acute 09/20/2015   Vitamin B 12 deficiency    Vitamin D deficiency 05/25/2012    Past Surgical History:  Procedure Laterality Date   ABDOMINAL HYSTERECTOMY     APPENDECTOMY     CARDIAC CATHETERIZATION  12/23/2019   CATARACT EXTRACTION     CHOLECYSTECTOMY     CORONARY STENT INTERVENTION N/A 12/23/2019   Procedure: CORONARY STENT INTERVENTION;  Surgeon: Belva Crome, MD;  Location: [facility];  Service: Cardiovascular;  Laterality: N/A;   JOINT REPLACEMENT     2 total knee replacement   LASIK     LEFT HEART CATH AND CORONARY ANGIOGRAPHY N/A 12/23/2019   Procedure: LEFT HEART CATH AND CORONARY ANGIOGRAPHY;  Surgeon: Belva Crome, MD;  Location: [facility];  Service: Cardiovascular;  Laterality: N/A;   REVERSE SHOULDER ARTHROPLASTY Right 04/03/2020   Procedure: REVERSE SHOULDER ARTHROPLASTY;  Surgeon: Nicholes Stairs, MD;  Location: [facility];  Service: Orthopedics;  Laterality: Right;  2.5 hrs   SHOULDER ARTHROSCOPY WITH ROTATOR CUFF REPAIR AND SUBACROMIAL DECOMPRESSION Left 10/25/2020   Procedure: Left shoulder arthroscopic, extensive debridement, subacromial decompression;  Surgeon: Nicholes Stairs, MD;  Location[facility];  Service: Orthopedics;  Laterality: Left;  90 mins   THYROIDECTOMY     TONSILLECTOMY      Family History  Problem Relation Age of Onset   Diabetes Mother    Cancer Mother        lung   Hypertension Mother    Cancer Father        lung   Diabetes Sister    Heart disease Brother    Diabetes Brother    Diabetes Brother    Breast cancer Neg Hx      Social History   Tobacco Use   Smoking status: Former    Types: Cigarettes    Quit date: 02/17/1987    Years since quitting: 35.1   Smokeless tobacco: Never  Vaping Use   Vaping Use: Never used  Substance Use Topics   Alcohol use: No    Drug use: No    Current Outpatient Medications on File Prior to Visit  Medication Sig Dispense Refill   aspirin 81 MG EC tablet Take 81 mg by mouth daily.     clonazePAM (KLONOPIN) 1 MG tablet Take 1 mg by mouth at bedtime as needed (restless leg).      HYDROcodone-acetaminophen (NORCO) 10-325 MG tablet Take 1 tablet by mouth 4 (four) times daily as needed.     Multiple Vitamins-Minerals (CENTRUM SILVER 50+WOMEN) TABS Take 1 tablet by mouth daily.     ondansetron (ZOFRAN ODT) 4 MG disintegrating tablet Take 1 tablet (4 mg total) by mouth every 8 (eight) hours as needed. 20 tablet 0   No current facility-administered medications on file prior to visit.     Review of Systems  Constitutional:  Positive for fatigue. Negative for fever.  HENT: Negative.    Eyes: Negative.   Respiratory: Negative.    Cardiovascular: Negative.   Gastrointestinal: Negative.   Genitourinary: Negative.   Musculoskeletal:  Positive for back pain.  Skin: Negative.   Neurological: Negative.   Psychiatric/Behavioral:  Positive for sleep disturbance. Negative for dysphoric mood. The patient is not nervous/anxious.         Objective:    Pulse 72   Temp (!) 97.5 F (36.4 C) (Oral)   Ht '5\' 1"'$  (1.549 m)   Wt 277 lb (125.6 kg)   SpO2 93%   BMI 52.34 kg/m   Wt Readings from Last 3 Encounters:  04/11/22 277 lb (125.6 kg)  02/20/22 273 lb (123.8 kg)  11/14/21 281 lb 6.4 oz (127.6 kg)    BP Readings from Last 3 Encounters:  02/20/22 134/78  11/14/21 122/80  06/12/21 (!) 150/96    Physical Exam     Assessment & Plan:   Problem List Items Addressed This Visit   None Visit Diagnoses     Need for influenza vaccination    -  Primary   Relevant Orders   Flu Vaccine QUAD High Dose(Fluad)        Follow up plan: No follow-ups on file.

## 2022-04-11 NOTE — Patient Instructions (Signed)
It was great to see you!  Start caltrate (calcium/vitamin D supplement) daily for your bones.   Start doxepin 1 tablet at bedtime as needed for sleep.   Let's follow-up in 3 months, sooner if you have concerns.  If a referral was placed today, you will be contacted for an appointment. Please note that routine referrals can sometimes take up to 3-4 weeks to process. Please call our office if you haven't heard anything after this time frame.  Take care,  Vance Peper, NP

## 2022-04-12 DIAGNOSIS — G4700 Insomnia, unspecified: Secondary | ICD-10-CM | POA: Insufficient documentation

## 2022-04-12 DIAGNOSIS — R7303 Prediabetes: Secondary | ICD-10-CM | POA: Insufficient documentation

## 2022-04-12 DIAGNOSIS — F419 Anxiety disorder, unspecified: Secondary | ICD-10-CM | POA: Insufficient documentation

## 2022-04-12 NOTE — Assessment & Plan Note (Signed)
She has been experiencing insomnia about 2-3 times per week.  She has tried Benadryl, melatonin, limiting screen time and caffeine and this is not helping.  We will have her start doxepin 3 mg as needed at bedtime for sleep.  Follow-up in 3 months.

## 2022-04-12 NOTE — Assessment & Plan Note (Signed)
Chronic, stable.  Recent lipid panel reviewed.  Continue atorvastatin 80 mg daily.  Follow-up in 3 months.

## 2022-04-12 NOTE — Assessment & Plan Note (Signed)
Chronic, stable.  Continue losartan 50 mg daily and Toprol-XL 50 mg daily.  Recent CMP, CBC reviewed.  Follow-up in 3 months.

## 2022-04-12 NOTE — Assessment & Plan Note (Signed)
Chronic, stable.  She takes Klonopin 1 mg as needed for restless leg.  She states that she does not take this frequently.  She states that other medications have not been effective in the past.

## 2022-04-12 NOTE — Assessment & Plan Note (Signed)
DEXA scan in 2014 showed a T score of -3.3.  She is not currently interested in repeating this or starting a medication.  Recommend that she does start a calcium and vitamin D supplement daily such as Caltrate.

## 2022-04-12 NOTE — Assessment & Plan Note (Signed)
Chronic, stable.  She can continue BuSpar 5 mg as needed for anxiety.  She states that she rarely has to take this.  She denies SI/HI.

## 2022-04-12 NOTE — Assessment & Plan Note (Signed)
Chronic, ongoing.  She is having moderate to severe lower back pain and is following with neurosurgery and pain management.  She has received an ablation and nerve block which helped with the pain.  She is currently taking hydrocodone 10-325 mg every 6 hours as needed.  Continue collaboration recommendations from pain management.

## 2022-04-12 NOTE — Assessment & Plan Note (Signed)
Chronic, stable.  She is euvolemic on exam today.  Continue losartan 50 mg daily, Toprol-XL 50 mg daily.  Most recent EF on echocardiogram was 65 to 70%.  Follow-up in 3 months.

## 2022-04-12 NOTE — Assessment & Plan Note (Signed)
BMI is 52.3.  She is currently limited in exercise due to severe lower back pain.  Discussed nutrition.

## 2022-04-12 NOTE — Assessment & Plan Note (Signed)
Most recent A1c was 6.2%.  Discussed nutrition and limiting sugars.  Follow-up in 3 months.

## 2022-04-14 ENCOUNTER — Telehealth: Payer: Self-pay | Admitting: Nurse Practitioner

## 2022-04-14 MED ORDER — BELSOMRA 5 MG PO TABS: 5.0000 mg | ORAL_TABLET | Freq: Every evening | ORAL | 0 refills | Status: DC | PRN

## 2022-04-14 NOTE — Telephone Encounter (Signed)
Caller Name: Daviona Call back phone #: [phone]   MEDICATION(S):  Doxepin HCl 3 MG TABS  - $140 and too expensive - pt requesting an alternative  Preferred Pharmacy:  [facility], [place]

## 2022-04-14 NOTE — Telephone Encounter (Signed)
Called and informed pt per provider "alternative med Belsomra sent to preferred pharmacy. Pt to take at bedtime as needed for sleep. She should not take this on the same night if she takes her klonopin for restless leg." Pt voiced understanding. Sw, cma

## 2022-04-16 ENCOUNTER — Other Ambulatory Visit: Payer: PPO

## 2022-05-01 DIAGNOSIS — M5432 Sciatica, left side: Secondary | ICD-10-CM | POA: Diagnosis not present

## 2022-05-01 DIAGNOSIS — M5136 Other intervertebral disc degeneration, lumbar region: Secondary | ICD-10-CM | POA: Diagnosis not present

## 2022-05-01 DIAGNOSIS — Z79899 Other long term (current) drug therapy: Secondary | ICD-10-CM | POA: Diagnosis not present

## 2022-05-01 DIAGNOSIS — Z5181 Encounter for therapeutic drug level monitoring: Secondary | ICD-10-CM | POA: Diagnosis not present

## 2022-05-03 NOTE — Progress Notes (Unsigned)
Cardiology Office Note:    Date:  05/03/2022   ID:  [name], DOB Jun 02, 1951, MRN [id number]  PCP:  [name], NP  Cardiologist:  Sinclair Grooms, MD   Referring MD: [name], DO   No chief complaint on file.   History of Present Illness:    [name] is a 71 y.o. female with a hx of  primary hypertension, obesity, hyperlipidemia, DM II, with recent unstable angina treated with DE stent circumflex with good result.  Intolerant of Brilinta due to dyspnea.    ***  Past Medical History:  Diagnosis Date   Anosmia 10/30/2015   Atypical facial pain 10/30/2015   Left upper face   Chronic pain syndrome 07/09/2017   Coronary artery disease    Depression    Dyspnea on exertion 09/20/2015   Gastro-esophageal reflux    GERD (gastroesophageal reflux disease) 05/25/2012   Hyperlipidemia    Hypertension    Hypothyroidism 05/25/2012   Last Assessment & Plan:  States was started after a partial thyroidectomy but does not recall any abnormal lab value and no symptoms.  Discussed as is on low dose, would be reasonable to discontinue and recheck in 6-8 weeks which she would like to do. Last Assessment & Plan:  States was started after a partial thyroidectomy but does not recall any abnormal lab value and no symptoms.  Discussed as    Lumbar spondylosis 07/09/2017   Memory disorder 10/30/2015   Obesity, morbid, BMI 40.0-49.9 ([place]) 06/02/2014   Osteoporosis 05/02/2013   Overview:  DEXA 11/14 DEXA 11/14   Prediabetes    Restless legs syndrome 05/25/2012   Last Assessment & Plan:  patient reports using clonazepam about once weekly .  Discussed nonpharmacologic tips for treating RLS.  States other medicatiosn she had treid had not been effective (does not remember name) Will refill for now.  Advised if use increases, will readdress in future   Thyroid disease    Unstable angina (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   URI, acute 09/20/2015    Vitamin B 12 deficiency    Vitamin D deficiency 05/25/2012    Past Surgical History:  Procedure Laterality Date   ABDOMINAL HYSTERECTOMY     APPENDECTOMY     CARDIAC CATHETERIZATION  12/23/2019   CATARACT EXTRACTION     CHOLECYSTECTOMY     CORONARY STENT INTERVENTION N/A 12/23/2019   Procedure: CORONARY STENT INTERVENTION;  Surgeon: Belva Crome, MD;  Location: [facility];  Service: Cardiovascular;  Laterality: N/A;   JOINT REPLACEMENT     2 total knee replacement   LASIK     LEFT HEART CATH AND CORONARY ANGIOGRAPHY N/A 12/23/2019   Procedure: LEFT HEART CATH AND CORONARY ANGIOGRAPHY;  Surgeon: Belva Crome, MD;  Location: [facility];  Service: Cardiovascular;  Laterality: N/A;   REVERSE SHOULDER ARTHROPLASTY Right 04/03/2020   Procedure: REVERSE SHOULDER ARTHROPLASTY;  Surgeon: Nicholes Stairs, MD;  Location: [facility];  Service: Orthopedics;  Laterality: Right;  2.5 hrs   SHOULDER ARTHROSCOPY WITH ROTATOR CUFF REPAIR AND SUBACROMIAL DECOMPRESSION Left 10/25/2020   Procedure: Left shoulder arthroscopic, extensive debridement, subacromial decompression;  Surgeon: Nicholes Stairs, MD;  Location: [facility];  Service: Orthopedics;  Laterality: Left;  90 mins   THYROIDECTOMY     TONSILLECTOMY      Current Medications: No outpatient medications have been marked as taking for the 05/05/22 encounter (Appointment) with Tamala Julian,  Lynnell Dike, MD.     Allergies:   Patient has no known allergies.   Social History   Socioeconomic History   Marital status: Divorced    Spouse name: Not on file   Number of children: 1   Years of education: Not on file   Highest education level: Not on file  Occupational History   Not on file  Tobacco Use   Smoking status: Former    Types: Cigarettes    Quit date: 02/17/1987    Years since quitting: 35.2   Smokeless tobacco: Never  Vaping Use   Vaping Use: Never used  Substance and Sexual Activity   Alcohol use: No   Drug use: No    Sexual activity: Not Currently    Birth control/protection: Post-menopausal, Surgical    Comment: Hysterectomy  Other Topics Concern   Not on file  Social History Narrative      Social Determinants of Health   Financial Resource Strain: Not on file  Food Insecurity: Not on file  Transportation Needs: Not on file  Physical Activity: Not on file  Stress: Not on file  Social Connections: Not on file     Family History: The patient's family history includes Cancer in her father and mother; Diabetes in her brother, brother, mother, and sister; Heart disease in her brother; Hypertension in her mother. There is no history of Breast cancer.  ROS:   Please see the history of present illness.    *** All other systems reviewed and are negative.  EKGs/Labs/Other Studies Reviewed:    The following studies were reviewed today: CARDIAC CATH 2021: ntervention   LONG TERM MONITOR 03/24/2022 Study Highlights      NSR with average HR 67 bpm   PVC burden 1.3%   Moderate correlation between PVC's and diary complaints   No atrial fibrillation / flutter etc.  EKG:  EKG ***  Recent Labs: 06/11/2021: Hemoglobin 14.1; Magnesium 2.1; Platelets 201 11/14/2021: ALT 17; BUN 12; Creatinine, Ser 0.77; Potassium 4.7; Sodium 141; TSH 0.801  Recent Lipid Panel    Component Value Date/Time   CHOL 135 11/14/2021 0955   TRIG 104 11/14/2021 0955   HDL 70 11/14/2021 0955   CHOLHDL 1.9 11/14/2021 0955   LDLCALC 46 11/14/2021 0955    Physical Exam:    VS:  There were no vitals taken for this visit.    Wt Readings from Last 3 Encounters:  04/11/22 277 lb (125.6 kg)  02/20/22 273 lb (123.8 kg)  11/14/21 281 lb 6.4 oz (127.6 kg)     GEN: ***. No acute distress HEENT: Normal NECK: No JVD. LYMPHATICS: No lymphadenopathy CARDIAC: *** murmur. RRR *** gallop, or edema. VASCULAR: *** Normal Pulses. No bruits. RESPIRATORY:  Clear to auscultation without rales, wheezing or rhonchi  ABDOMEN: Soft,  non-tender, non-distended, No pulsatile mass, MUSCULOSKELETAL: No deformity  SKIN: Warm and dry NEUROLOGIC:  Alert and oriented x 3 PSYCHIATRIC:  Normal affect   ASSESSMENT:    1. Coronary artery disease involving native coronary artery of native heart with unstable angina pectoris ([place])   2. Essential hypertension   3. Chronic heart failure with preserved ejection fraction (HFpEF) ([place])   4. Hyperlipidemia LDL goal <70   5. Controlled type 2 diabetes mellitus without complication, without long-term current use of insulin (HCC)   6. Palpitations    PLAN:    In order of problems listed above:  ***   Medication Adjustments/Labs and Tests Ordered: Current medicines are reviewed at length  with the patient today.  Concerns regarding medicines are outlined above.  No orders of the defined types were placed in this encounter.  No orders of the defined types were placed in this encounter.   There are no Patient Instructions on file for this visit.   Signed, Sinclair Grooms, MD  05/03/2022 2:40 PM    [facility]

## 2022-05-05 ENCOUNTER — Encounter: Payer: Self-pay | Admitting: Interventional Cardiology

## 2022-05-05 ENCOUNTER — Ambulatory Visit: Payer: PPO | Attending: Interventional Cardiology | Admitting: Interventional Cardiology

## 2022-05-05 VITALS — BP 138/78 | HR 65 | Ht 61.0 in | Wt 281.8 lb

## 2022-05-05 DIAGNOSIS — I25110 Atherosclerotic heart disease of native coronary artery with unstable angina pectoris: Secondary | ICD-10-CM | POA: Diagnosis not present

## 2022-05-05 DIAGNOSIS — I5032 Chronic diastolic (congestive) heart failure: Secondary | ICD-10-CM | POA: Diagnosis not present

## 2022-05-05 DIAGNOSIS — E785 Hyperlipidemia, unspecified: Secondary | ICD-10-CM

## 2022-05-05 DIAGNOSIS — I10 Essential (primary) hypertension: Secondary | ICD-10-CM

## 2022-05-05 DIAGNOSIS — E119 Type 2 diabetes mellitus without complications: Secondary | ICD-10-CM | POA: Diagnosis not present

## 2022-05-05 DIAGNOSIS — R002 Palpitations: Secondary | ICD-10-CM | POA: Diagnosis not present

## 2022-05-05 NOTE — Patient Instructions (Signed)
Medication Instructions:  Your physician recommends that you continue on your current medications as directed. Please refer to the Current Medication list given to you today.  *If you need a refill on your cardiac medications before your next appointment, please call your pharmacy*  Lab Work: NONE  Testing/Procedures: NONE  Follow-Up: At [facility], you and your health needs are our priority.  As part of our continuing mission to provide you with exceptional heart care, we have created designated Provider Care Teams.  These Care Teams include your primary Cardiologist (physician) and Advanced Practice Providers (APPs -  Physician Assistants and Nurse Practitioners) who all work together to provide you with the care you need, when you need it.  Your next appointment:   1 year(s)  The format for your next appointment:   In Person  Provider:   Christen Bame, NP  Important Information About [place]

## 2022-05-07 DIAGNOSIS — M5432 Sciatica, left side: Secondary | ICD-10-CM | POA: Diagnosis not present

## 2022-05-12 ENCOUNTER — Encounter: Payer: Self-pay | Admitting: Nurse Practitioner

## 2022-05-29 ENCOUNTER — Telehealth: Payer: Self-pay | Admitting: Nurse Practitioner

## 2022-05-29 ENCOUNTER — Emergency Department (HOSPITAL_COMMUNITY)
Admission: EM | Admit: 2022-05-29 | Discharge: 2022-05-30 | Disposition: A | Discharge status: Home / Self Care | Payer: PPO | Attending: Emergency Medicine | Admitting: Emergency Medicine

## 2022-05-29 ENCOUNTER — Encounter (HOSPITAL_COMMUNITY): Payer: Self-pay

## 2022-05-29 ENCOUNTER — Emergency Department (HOSPITAL_COMMUNITY): Payer: PPO

## 2022-05-29 ENCOUNTER — Encounter: Payer: Self-pay | Admitting: Nurse Practitioner

## 2022-05-29 DIAGNOSIS — Z7982 Long term (current) use of aspirin: Secondary | ICD-10-CM | POA: Diagnosis not present

## 2022-05-29 DIAGNOSIS — R42 Dizziness and giddiness: Secondary | ICD-10-CM | POA: Diagnosis not present

## 2022-05-29 DIAGNOSIS — E039 Hypothyroidism, unspecified: Secondary | ICD-10-CM | POA: Insufficient documentation

## 2022-05-29 DIAGNOSIS — Z79899 Other long term (current) drug therapy: Secondary | ICD-10-CM | POA: Insufficient documentation

## 2022-05-29 DIAGNOSIS — I10 Essential (primary) hypertension: Secondary | ICD-10-CM | POA: Insufficient documentation

## 2022-05-29 LAB — COMPREHENSIVE METABOLIC PANEL
ALT: 19 U/L (ref 0–44)
AST: 20 U/L (ref 15–41)
Albumin: 3.5 g/dL (ref 3.5–5.0)
Alkaline Phosphatase: 80 U/L (ref 38–126)
Anion gap: 8 (ref 5–15)
BUN: 16 mg/dL (ref 8–23)
CO2: 23 mmol/L (ref 22–32)
Calcium: 8.8 mg/dL — ABNORMAL LOW (ref 8.9–10.3)
Chloride: 110 mmol/L (ref 98–111)
Creatinine, Ser: 0.79 mg/dL (ref 0.44–1.00)
GFR, Estimated: >60 mL/min (ref >60)
Glucose, Bld: 122 mg/dL — ABNORMAL HIGH (ref 70–99)
Potassium: 5 mmol/L (ref 3.5–5.1)
Sodium: 141 mmol/L (ref 135–145)
Total Bilirubin: 1.2 mg/dL (ref 0.3–1.2)
Total Protein: 6.4 g/dL — ABNORMAL LOW (ref 6.5–8.1)

## 2022-05-29 LAB — CBC WITH DIFFERENTIAL/PLATELET
Abs Immature Granulocytes: 0.03 10*3/uL (ref 0.00–0.07)
Basophils Absolute: 0 10*3/uL (ref 0.0–0.1)
Basophils Relative: 1 %
Eosinophils Absolute: 0.2 10*3/uL (ref 0.0–0.5)
Eosinophils Relative: 3 %
HCT: 45.8 % (ref 36.0–46.0)
Hemoglobin: 14.3 g/dL (ref 12.0–15.0)
Immature Granulocytes: 1 %
Lymphocytes Relative: 33 %
Lymphs Abs: 1.9 10*3/uL (ref 0.7–4.0)
MCH: 30.7 pg (ref 26.0–34.0)
MCHC: 31.2 g/dL (ref 30.0–36.0)
MCV: 98.3 fL (ref 80.0–100.0)
Monocytes Absolute: 0.4 10*3/uL (ref 0.1–1.0)
Monocytes Relative: 6 %
Neutro Abs: 3.3 10*3/uL (ref 1.7–7.7)
Neutrophils Relative %: 56 %
Platelets: 169 10*3/uL (ref 150–400)
RBC: 4.66 MIL/uL (ref 3.87–5.11)
RDW: 13.7 % (ref 11.5–15.5)
WBC: 5.8 10*3/uL (ref 4.0–10.5)
nRBC: 0 % (ref 0.0–0.2)

## 2022-05-29 NOTE — Telephone Encounter (Signed)
FYI message below, tried calling patient to check on her. No answer VM on first ring LMTCB

## 2022-05-29 NOTE — Telephone Encounter (Signed)
Noted, I sent her a mychart message as well.

## 2022-05-29 NOTE — ED Provider Triage Note (Signed)
Emergency Medicine Provider Triage Evaluation Note  [name] , a 71 y.o. female  was evaluated in triage.  Pt complains of dizziness for 2 months, visual changes yesterday and stabbing headaches right side  Review of Systems  Positive: dizziness Negative: Fever or cough  Physical Exam  BP (!) 163/91 (BP Location: Right Arm)   Pulse 75   Temp 98.8 F (37.1 C) (Oral)   Resp 18   SpO2 100%  Gen:   Awake, no distress   Resp:  Normal effort  MSK:   Moves extremities without difficulty  Other:    Medical Decision Making  Medically screening exam initiated at 3:00 PM.  Appropriate orders placed.  [name] was informed that the remainder of the evaluation will be completed by another provider, this initial triage assessment does not replace that evaluation, and the importance of remaining in the ED until their evaluation is complete.     [name], [place] 05/29/22 1501

## 2022-05-29 NOTE — Discharge Instructions (Signed)

## 2022-05-29 NOTE — ED Provider Notes (Signed)
[facility] DEPT Provider Note   CSN: [id number] Arrival date & time: 05/29/22  1430     History  Chief Complaint  Patient presents with   Dizziness    [name] is a 71 y.o. female.  The history is provided by the patient.  Patient with history of hypertension and obesity presents with dizziness for several months.  She reports she will have intermittent episodes of dizziness and world spinning that resolved spontaneously. She reports over the past several weeks she will have pain above her right eye.  No vision loss or diplopia. She reports over the past 24 hours it has been happening more frequently.  She reports the episodes are very brief. No recent falls or trauma.  Previous eye surgery includes LASEK and cataracts No previous history of CVA She reports the pain feels similar to previous episode of zoster on her left side No new medications No hearing changes    Past Medical History:  Diagnosis Date   Anosmia 10/30/2015   Atypical facial pain 10/30/2015   Left upper face   Chronic pain syndrome 07/09/2017   Coronary artery disease    Depression    Dyspnea on exertion 09/20/2015   Gastro-esophageal reflux    GERD (gastroesophageal reflux disease) 05/25/2012   Hyperlipidemia    Hypertension    Hypothyroidism 05/25/2012   Last Assessment & Plan:  States was started after a partial thyroidectomy but does not recall any abnormal lab value and no symptoms.  Discussed as is on low dose, would be reasonable to discontinue and recheck in 6-8 weeks which she would like to do. Last Assessment & Plan:  States was started after a partial thyroidectomy but does not recall any abnormal lab value and no symptoms.  Discussed as    Lumbar spondylosis 07/09/2017   Memory disorder 10/30/2015   Obesity, morbid, BMI 40.0-49.9 ([place]) 06/02/2014   Osteoporosis 05/02/2013   Overview:  DEXA 11/14 DEXA 11/14   Prediabetes    Restless legs syndrome  05/25/2012   Last Assessment & Plan:  patient reports using clonazepam about once weekly .  Discussed nonpharmacologic tips for treating RLS.  States other medicatiosn she had treid had not been effective (does not remember name) Will refill for now.  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 Authorizing Provider  aspirin 81 MG EC tablet Take 81 mg by mouth daily.    [provider]  atorvastatin (LIPITOR) 80 MG tablet TAKE 1 TABLET(80 MG) BY MOUTH DAILY 04/11/22   McElwee, Lauren A, NP  busPIRone (BUSPAR) 5 MG tablet Take 1 tablet (5 mg total) by mouth daily as needed (anxiety). TAKE 1 TABLET BY MOUTH EVERY DAY IN THE MORNING FOR ANXIETY 04/11/22   McElwee, Lauren A, NP  clonazePAM (KLONOPIN) 1 MG tablet Take 1 mg by mouth at bedtime as needed (restless leg).  03/05/20   [provider]  HYDROcodone-acetaminophen (NORCO) 10-325 MG tablet Take 1 tablet by mouth 4 (four) times daily as needed. 02/06/22   [provider]  losartan (COZAAR) 50 MG tablet Take 1 tablet (50 mg total) by mouth daily. 04/11/22   Vance Peper  A, NP  metoprolol succinate (TOPROL-XL) 50 MG 24 hr tablet Take 1 tablet (50 mg total) by mouth daily. 04/11/22 04/12/23  McElwee, Scheryl Darter, NP  Multiple Vitamins-Minerals (CENTRUM SILVER 50+WOMEN) TABS Take 1 tablet by mouth daily.    [provider]  ondansetron (ZOFRAN ODT) 4 MG disintegrating tablet Take 1 tablet (4 mg total) by mouth every 8 (eight) hours as needed. 10/25/20   Nicholes Stairs, MD  pantoprazole (PROTONIX) 40 MG tablet Take 1 tablet (40 mg total) by mouth daily. 04/11/22   McElwee, Lauren A, NP  Suvorexant (BELSOMRA) 5 MG TABS Take 5 mg by  mouth at bedtime as needed. Do not take if you take clonazepam that night 04/14/22   McElwee, Scheryl Darter, NP      Allergies    Patient has no known allergies.    Review of Systems   Review of Systems  Constitutional:  Negative for fever.  Eyes:  Negative for photophobia, pain and redness.       Denies diplopia, denies visual loss  Cardiovascular:  Negative for chest pain.  Neurological:  Positive for dizziness and headaches. Negative for syncope, speech difficulty, weakness and numbness.    Physical Exam Updated Vital Signs BP (!) 146/100 (BP Location: Left Arm)   Pulse 84   Temp 98 F (36.7 C) (Oral)   Resp 15   SpO2 94%  Physical Exam CONSTITUTIONAL: Well developed/well nourished, well appearing and smiling HEAD: Normocephalic/atraumatic there is no rash to her forehead scalp EYES: EOMI/PERRL, no nystagmus, no ptosis, no visual field deficits, no corneal haziness, no conjunctival injection ENMT: Mucous membranes moist, no facial rash NECK: supple no meningeal signs, no bruits SPINE/BACK:entire spine nontender CV: S1/S2 noted, no murmurs/rubs/gallops noted LUNGS: Lungs are clear to auscultation bilaterally, no apparent distress ABDOMEN: soft, nontender, no rebound or guarding GU:no cva tenderness NEURO:Awake/alert, face symmetric, no arm or leg drift is noted Equal 5/5 strength with shoulder abduction, elbow flex/extension, wrist flex/extension in upper extremities and equal hand grips bilaterally Equal 5/5 strength with hip flexion,knee flex/extension, foot dorsi/plantar flexion Cranial nerves 3/4/5/6/12/29/08/11/12 tested and intact Gait normal without ataxia No past pointing Sensation to light touch intact in all extremities EXTREMITIES: pulses normal, full ROM SKIN: warm, color normal PSYCH: no abnormalities of mood noted, alert and oriented to situation  ED Results / Procedures / Treatments   Labs (all labs ordered are listed, but only abnormal results are  displayed) Labs Reviewed  COMPREHENSIVE METABOLIC PANEL - Abnormal; Notable for the following components:      Result Value   Glucose, Bld 122 (*)    Calcium 8.8 (*)    Total Protein 6.4 (*)    All other components within normal limits  CBC WITH DIFFERENTIAL/PLATELET    EKG ED ECG REPORT   Date: 05/29/2022 1441  Rate: 70  Rhythm: normal sinus rhythm  QRS Axis: normal  Intervals: normal  ST/T Wave abnormalities: nonspecific ST changes  Conduction Disutrbances:none  Narrative Interpretation:   Old EKG Reviewed: unchanged  I have personally reviewed the EKG tracing and agree with the computerized printout as noted.   Radiology CT Head Wo Contrast  Result Date: 05/29/2022 CLINICAL DATA:  Dizziness.  Vertigo. EXAM: CT HEAD WITHOUT CONTRAST TECHNIQUE: Contiguous axial images were obtained from the base of the skull through the vertex without intravenous contrast. RADIATION DOSE REDUCTION: This exam was performed according to the departmental dose-optimization program which includes automated exposure control, adjustment of the mA and/or kV according to patient  size and/or use of iterative reconstruction technique. COMPARISON:  Brain MRI 11/07/2015 FINDINGS: Brain: There is no evidence for acute hemorrhage, hydrocephalus, mass lesion, or abnormal extra-axial fluid collection. No definite CT evidence for acute infarction. Diffuse loss of parenchymal volume is consistent with atrophy. Patchy low attenuation in the deep hemispheric and periventricular white matter is nonspecific, but likely reflects chronic microvascular ischemic demyelination. Vascular: No hyperdense vessel or unexpected calcification. Skull: No evidence for fracture. No worrisome lytic or sclerotic lesion. Sinuses/Orbits: The visualized paranasal sinuses and mastoid air cells are clear. Visualized portions of the globes and intraorbital fat are unremarkable. Other: None. IMPRESSION: 1. No acute intracranial abnormality. 2.  Atrophy with chronic small vessel ischemic disease. Electronically Signed   By: Misty Stanley M.D.   On: 05/29/2022 15:41    Procedures Procedures    Medications Ordered in ED Medications - No data to display  ED Course/ Medical Decision Making/ A&P Clinical Course as of 05/29/22 2351  Thu May 29, 2022  2324 Patient feels well at this time.  Other than mild hypertension, exam is unremarkable.  She has no symptoms at this time.  She walked in the ER.  No temporal tenderness, no rash or tenderness to face or forehead.  Low suspicion for temporal arteritis.  No signs of zoster at this time.  I have very low suspicion for acute CVA given history and time course.  She will follow-up with her PCP for further evaluation.  We discussed strict return precautions [DW]    Clinical Course User Index [DW] Ripley Fraise, MD                           Medical Decision Making  This patient presents to the ED for concern of headache and dizziness, this involves an extensive number of treatment options, and is a complaint that carries with it a high risk of complications and morbidity.  The differential diagnosis includes but is not limited to subarachnoid hemorrhage, intracranial hemorrhage, meningitis, encephalitis, CVST, temporal arteritis, idiopathic intracranial hypertension, migraine, glaucoma    Comorbidities that complicate the patient evaluation: Patient's presentation is complicated by their history of hypertension and obesity   Additional history obtained: Additional history obtained from family Records reviewed Primary Care Documents  Lab Tests: I Ordered, and personally interpreted labs.  The pertinent results include: Mild hyperglycemia  Imaging Studies ordered: I ordered imaging studies including CT scan head   I independently visualized and interpreted imaging which showed no acute findings I agree with the radiologist interpretation  Cardiac Monitoring: The patient was  maintained on a cardiac monitor.  I personally viewed and interpreted the cardiac monitor which showed an underlying rhythm of:  sinus rhythm  Test Considered: I considered MRI brain, but patient symptoms are improving and have been chronic, will defer as outpatient   Reevaluation: After the interventions noted above, I reevaluated the patient and found that they have :improved  Complexity of problems addressed: Patient's presentation is most consistent with  acute presentation with potential threat to life or bodily function  Disposition: After consideration of the diagnostic results and the patient's response to treatment,  I feel that the patent would benefit from discharge   .     {Document critical care time when appropriate:1      Final Clinical Impression(s) / ED Diagnoses Final diagnoses:  Dizziness    Rx / DC Orders ED Discharge Orders     None  Ripley Fraise, MD 05/29/22 2352

## 2022-05-29 NOTE — ED Triage Notes (Signed)
Pt arrived via POV, c/o intermittent dizziness for several weeks. States feeling "pins and needles" to right side of forehead that started around 1pm. No facial droop, no neurological deficits. Sensation intact bilaterally.

## 2022-05-29 NOTE — Telephone Encounter (Signed)
FYI:Pt called in wanting to schedule an appointment with Lauren. She is complaining of having sharp pains in her head above heR right eye and having dizzy spells. I transferred her over to nurse triage.

## 2022-06-25 ENCOUNTER — Telehealth: Payer: Self-pay | Admitting: Cardiology

## 2022-06-25 NOTE — Telephone Encounter (Signed)
Dr. Martinique approved to become this patient's new provider from Dr. Tamala Julian (retiring from [facility].). 06-25-2022 Noreene Larsson)

## 2022-07-14 ENCOUNTER — Encounter: Payer: Self-pay | Admitting: Nurse Practitioner

## 2022-07-14 ENCOUNTER — Ambulatory Visit (INDEPENDENT_AMBULATORY_CARE_PROVIDER_SITE_OTHER): Payer: PPO | Admitting: Nurse Practitioner

## 2022-07-14 VITALS — BP 130/78 | HR 68 | Temp 96.5°F | Ht 61.0 in | Wt 277.2 lb

## 2022-07-14 DIAGNOSIS — I5032 Chronic diastolic (congestive) heart failure: Secondary | ICD-10-CM | POA: Diagnosis not present

## 2022-07-14 DIAGNOSIS — G8929 Other chronic pain: Secondary | ICD-10-CM | POA: Diagnosis not present

## 2022-07-14 DIAGNOSIS — R7303 Prediabetes: Secondary | ICD-10-CM | POA: Diagnosis not present

## 2022-07-14 DIAGNOSIS — F419 Anxiety disorder, unspecified: Secondary | ICD-10-CM

## 2022-07-14 DIAGNOSIS — I25118 Atherosclerotic heart disease of native coronary artery with other forms of angina pectoris: Secondary | ICD-10-CM

## 2022-07-14 DIAGNOSIS — I10 Essential (primary) hypertension: Secondary | ICD-10-CM | POA: Diagnosis not present

## 2022-07-14 DIAGNOSIS — R519 Headache, unspecified: Secondary | ICD-10-CM

## 2022-07-14 LAB — POCT GLYCOSYLATED HEMOGLOBIN (HGB A1C): Hemoglobin A1C: 6.1 % — AB (ref 4.0–5.6)

## 2022-07-14 MED ORDER — METFORMIN HCL ER 500 MG PO TB24: 500.0000 mg | ORAL_TABLET | Freq: Every day | ORAL | 2 refills | Status: DC

## 2022-07-14 MED ORDER — BUSPIRONE HCL 5 MG PO TABS: 5.0000 mg | ORAL_TABLET | Freq: Every day | ORAL | 2 refills | Status: DC | PRN

## 2022-07-14 NOTE — Assessment & Plan Note (Signed)
BMI 52.3.  Discussed nutrition and handouts given.  Will also start metformin XR 500 mg daily to see if this helps with weight loss along with preventing her prediabetes from turning into diabetes.  Follow-up in 3 months.

## 2022-07-14 NOTE — Assessment & Plan Note (Signed)
Chronic, stable.  Euvolemic on exam today.  Continue losartan 50 mg daily and Toprol-XL 50 mg daily.  Most recent echocardiogram showed an EF of 65 to 70%.

## 2022-07-14 NOTE — Patient Instructions (Addendum)
It was great to see you!  I have ordered a MRI of your head. They will call to schedule.   Start metformin 1 tablet with breakfast daily. This can help with weight loss.   Let's follow-up in 3 months, sooner if you have concerns.  If a referral was placed today, you will be contacted for an appointment. Please note that routine referrals can sometimes take up to 3-4 weeks to process. Please call our office if you haven't heard anything after this time frame.  Take care,  Vance Peper, NP

## 2022-07-14 NOTE — Assessment & Plan Note (Signed)
Chronic, stable.  Continue metoprolol 50 mg daily and losartan 50 mg daily.  Recent CMP, CBC reviewed.  Follow-up in 6 months.

## 2022-07-14 NOTE — Progress Notes (Signed)
Established Patient Office Visit  Subjective   Patient ID: [name], female    DOB: April 02, 1951  Age: 72 y.o. MRN: [id number]  Chief Complaint  Patient presents with   Follow-up    3 month follow up , she went the ED for sharp in head she had ct scan  and didn't show any thing and she was told follow up PCP for MRI , need a refill medication    HPI  [name] is here to follow-up on prediabetes, hypertension, and headache.   She states that her blood pressure is doing well. She denies chest pain and shortness of breath.   She has noticed that she started getting sharp pain in her right forehead that lasts about 20 seconds and then goes away. She states that she gets dizzy before this happens and then the pain comes. This happens about once a week or every other week. She denies weakness, slurred speech, and forgetfulness. She went to the ER for this on 05/29/22 and had labs which were normal and a CT of her head which negative as well.      ROS See pertinent positives and negatives per HPI.    Objective:     BP 130/78   Pulse 68   Temp (!) 96.5 F (35.8 C)   Ht '5\' 1"'$  (1.549 m)   Wt 277 lb 3.2 oz (125.7 kg)   SpO2 96%   BMI 52.38 kg/m     Physical Exam Vitals and nursing note reviewed.  Constitutional:      General: She is not in acute distress.    Appearance: Normal appearance.  HENT:     Head: Normocephalic.  Eyes:     Conjunctiva/sclera: Conjunctivae normal.  Cardiovascular:     Rate and Rhythm: Normal rate and regular rhythm.     Pulses: Normal pulses.     Heart sounds: Normal heart sounds.  Pulmonary:     Effort: Pulmonary effort is normal.     Breath sounds: Normal breath sounds.  Musculoskeletal:     Cervical back: Normal range of motion.  Skin:    General: Skin is warm.  Neurological:     General: No focal deficit present.     Mental Status: She is alert and oriented to person, place, and time.     Cranial Nerves: No cranial  nerve deficit.     Motor: No weakness.     Coordination: Coordination normal.     Gait: Gait normal.  Psychiatric:        Mood and Affect: Mood normal.        Behavior: Behavior normal.        Thought Content: Thought content normal.        Judgment: Judgment normal.     The 10-year ASCVD risk score (Arnett DK, et al., 2019) is: 29.2%    Assessment & Plan:   Problem List Items Addressed This Visit       Cardiovascular and Mediastinum   Essential hypertension    Chronic, stable.  Continue metoprolol 50 mg daily and losartan 50 mg daily.  Recent CMP, CBC reviewed.  Follow-up in 6 months.      Chronic diastolic CHF (congestive heart failure) (HCC)    Chronic, stable.  Euvolemic on exam today.  Continue losartan 50 mg daily and Toprol-XL 50 mg daily.  Most recent echocardiogram showed an EF of 65 to 70%.      Coronary artery disease of native artery  of native heart with stable angina pectoris (HCC)    Chronic, stable.  She is currently chest pain-free.  Will have her continue atorvastatin 80 mg daily, aspirin 81 mg daily, Toprol-XL 50 mg daily, and losartan 50 mg daily.  Recent CMP, CBC reviewed.        Other   Morbid obesity ([place])    BMI 52.3.  Discussed nutrition and handouts given.  Will also start metformin XR 500 mg daily to see if this helps with weight loss along with preventing her prediabetes from turning into diabetes.  Follow-up in 3 months.      Relevant Medications   metFORMIN (GLUCOPHAGE-XR) 500 MG 24 hr tablet   Prediabetes    Chronic, stable.  A1c today 6.1%.  Will have her start metformin XR 500 mg daily with food to see if this helps keep her sugars down and with weight loss.  Follow-up in 3 months.      Relevant Orders   POCT glycosylated hemoglobin (Hb A1C) (Completed)   Anxiety    Chronic, stable.  Continue BuSpar 5 mg daily as needed.      Relevant Medications   busPIRone (BUSPAR) 5 MG tablet   Chronic intractable headache - Primary    She has  been experiencing dizziness/lightheadedness followed by a sharp pain in her right temple that lasts about 20 seconds.  She went to the emergency room and had normal CMP, CBC.  She also had a CT scan which was normal.  She is still having this sharp pain ongoing every week or every other week.  With new headache and ongoing symptoms, will order an MRI for further evaluation.      Relevant Orders   MR Brain W Wo Contrast    Return in about 3 months (around 10/13/2022) for prediabetes, weight management, headaches .    [name], NP

## 2022-07-14 NOTE — Assessment & Plan Note (Signed)
She has been experiencing dizziness/lightheadedness followed by a sharp pain in her right temple that lasts about 20 seconds.  She went to the emergency room and had normal CMP, CBC.  She also had a CT scan which was normal.  She is still having this sharp pain ongoing every week or every other week.  With new headache and ongoing symptoms, will order an MRI for further evaluation.

## 2022-07-14 NOTE — Assessment & Plan Note (Signed)
Chronic, stable.  She is currently chest pain-free.  Will have her continue atorvastatin 80 mg daily, aspirin 81 mg daily, Toprol-XL 50 mg daily, and losartan 50 mg daily.  Recent CMP, CBC reviewed.

## 2022-07-14 NOTE — Assessment & Plan Note (Signed)
Chronic, stable.  A1c today 6.1%.  Will have her start metformin XR 500 mg daily with food to see if this helps keep her sugars down and with weight loss.  Follow-up in 3 months.

## 2022-07-14 NOTE — Assessment & Plan Note (Signed)
Chronic, stable.  Continue BuSpar 5 mg daily as needed.

## 2022-08-07 DIAGNOSIS — M5432 Sciatica, left side: Secondary | ICD-10-CM | POA: Diagnosis not present

## 2022-08-10 ENCOUNTER — Other Ambulatory Visit: Payer: PPO

## 2022-08-21 DIAGNOSIS — M792 Neuralgia and neuritis, unspecified: Secondary | ICD-10-CM | POA: Diagnosis not present

## 2022-08-31 ENCOUNTER — Ambulatory Visit
Admission: RE | Admit: 2022-08-31 | Discharge: 2022-08-31 | Disposition: A | Discharge status: Home / Self Care | Payer: PPO | Source: Ambulatory Visit | Attending: Nurse Practitioner | Admitting: Nurse Practitioner

## 2022-08-31 DIAGNOSIS — G8929 Other chronic pain: Secondary | ICD-10-CM

## 2022-08-31 DIAGNOSIS — I6782 Cerebral ischemia: Secondary | ICD-10-CM | POA: Diagnosis not present

## 2022-08-31 DIAGNOSIS — R42 Dizziness and giddiness: Secondary | ICD-10-CM | POA: Diagnosis not present

## 2022-08-31 MED ORDER — GADOPICLENOL 0.5 MMOL/ML IV SOLN
10.0000 mL | Freq: Once | INTRAVENOUS | Status: AC | PRN
  Administered 2022-08-31: 10 mL via INTRAVENOUS

## 2022-09-09 ENCOUNTER — Telehealth: Payer: Self-pay | Admitting: Nurse Practitioner

## 2022-09-09 NOTE — Telephone Encounter (Signed)
Caller Name: Elinda Call back phone #: [phone]  Reason for Call: Pt would like a call to discuss the results of her MRI

## 2022-09-09 NOTE — Telephone Encounter (Signed)
I called patient and notified her of message and she is scheduled for tomorrow at 420pm

## 2022-09-10 ENCOUNTER — Encounter: Payer: Self-pay | Admitting: Nurse Practitioner

## 2022-09-10 ENCOUNTER — Ambulatory Visit (INDEPENDENT_AMBULATORY_CARE_PROVIDER_SITE_OTHER): Payer: PPO | Admitting: Nurse Practitioner

## 2022-09-10 VITALS — BP 122/78 | HR 72 | Temp 98.1°F | Ht 61.0 in | Wt 281.6 lb

## 2022-09-10 DIAGNOSIS — I25118 Atherosclerotic heart disease of native coronary artery with other forms of angina pectoris: Secondary | ICD-10-CM

## 2022-09-10 DIAGNOSIS — H04123 Dry eye syndrome of bilateral lacrimal glands: Secondary | ICD-10-CM | POA: Diagnosis not present

## 2022-09-10 DIAGNOSIS — R7303 Prediabetes: Secondary | ICD-10-CM

## 2022-09-10 DIAGNOSIS — R42 Dizziness and giddiness: Secondary | ICD-10-CM

## 2022-09-10 DIAGNOSIS — E782 Mixed hyperlipidemia: Secondary | ICD-10-CM

## 2022-09-10 DIAGNOSIS — I5032 Chronic diastolic (congestive) heart failure: Secondary | ICD-10-CM

## 2022-09-10 DIAGNOSIS — E039 Hypothyroidism, unspecified: Secondary | ICD-10-CM | POA: Diagnosis not present

## 2022-09-10 DIAGNOSIS — I10 Essential (primary) hypertension: Secondary | ICD-10-CM

## 2022-09-10 DIAGNOSIS — H43813 Vitreous degeneration, bilateral: Secondary | ICD-10-CM | POA: Diagnosis not present

## 2022-09-10 MED ORDER — OZEMPIC (0.25 OR 0.5 MG/DOSE) 2 MG/1.5ML ~~LOC~~ SOPN: 0.2500 mg | PEN_INJECTOR | SUBCUTANEOUS | 1 refills | Status: DC

## 2022-09-10 NOTE — Patient Instructions (Signed)
It was great to see you!  Start ozempic 0.25mg  injection once a week for 4 weeks, then increase to 0.5mg  injection.   We are checking your labs today and will let you know the results via mychart/phone.   Let's follow-up in 6-8 weeks, sooner if you have concerns.  If a referral was placed today, you will be contacted for an appointment. Please note that routine referrals can sometimes take up to 3-4 weeks to process. Please call our office if you haven't heard anything after this time frame.  Take care,  Vance Peper, NP

## 2022-09-10 NOTE — Assessment & Plan Note (Signed)
Check TSH today. She had a partial thyroidectomy and is not currently taking any medication.

## 2022-09-10 NOTE — Progress Notes (Signed)
Established Patient Office Visit  Subjective   Patient ID: [name], female    DOB: [birth year]  Age: 72 y.o. MRN: [id number]  Chief Complaint  Patient presents with   Results    Discuss MRI results, request a Rx for Ozempic    HPI  DAREN Camacho is here to follow-up on her recent MRI along with requesting Ozempic.  She states that her brain zaps are not happening as frequently as it were.  She still does get dizzy for about 5 minutes after these occur.  Her MRI was negative for any acute findings, however did show chronic small vessel disease.  She denies chest pain and shortness of breath.  She was started last year on Ozempic for elevated blood sugars and her history of CAD.  She states that she never started this from the pharmacy and would like to get a new prescription.  She is still taking her aspirin 81 mg daily, atorvastatin 80 mg daily, losartan 50 mg daily, Toprol-XL 50 mg daily.    ROS See pertinent positives and negatives per HPI.    Objective:     BP 122/78 (BP Location: Right Wrist, Patient Position: Sitting)   Pulse 72   Temp 98.1 F (36.7 C)   Ht 5\' 1"  (1.549 m)   Wt 281 lb 9.6 oz (127.7 kg)   SpO2 98%   BMI 53.21 kg/m    Physical Exam Vitals and nursing note reviewed.  Constitutional:      General: She is not in acute distress.    Appearance: Normal appearance. She is obese.  HENT:     Head: Normocephalic.  Eyes:     Conjunctiva/sclera: Conjunctivae normal.  Cardiovascular:     Rate and Rhythm: Normal rate and regular rhythm.     Pulses: Normal pulses.     Heart sounds: Normal heart sounds.  Pulmonary:     Effort: Pulmonary effort is normal.     Breath sounds: Normal breath sounds.  Musculoskeletal:     Cervical back: Normal range of motion.  Skin:    General: Skin is warm.  Neurological:     General: No focal deficit present.     Mental Status: She is alert and oriented to person, place, and time.  Psychiatric:         Mood and Affect: Mood normal.        Behavior: Behavior normal.        Thought Content: Thought content normal.        Judgment: Judgment normal.    The 10-year ASCVD risk score (Arnett DK, et al., 2019) is: 37.3%    Assessment & Plan:   Problem List Items Addressed This Visit       Cardiovascular and Mediastinum   Essential hypertension - Primary    Chronic, stable.  Continue metoprolol 50 mg daily and losartan 50 mg daily.  Check CMP, CBC today.  Follow-up in 6 months.      Relevant Orders   CBC   Comprehensive metabolic panel   Chronic diastolic CHF (congestive heart failure) (HCC)    Chronic, stable.  Euvolemic on exam today.  Continue losartan 50 mg daily and Toprol-XL 50 mg daily.  Most recent echocardiogram showed an EF of 65 to 70%.      Coronary artery disease of native artery of native heart with stable angina pectoris (HCC)    Chronic, stable.  She is currently chest pain-free.  Will have her continue atorvastatin 80  mg daily, aspirin 81 mg daily, Toprol-XL 50 mg daily, and losartan 50 mg daily.  Will also have her start ozempic 0.25mg  injection weekly for 4 weeks, then increase to 0.5mg  injection weekly. Check CMP, CBC, lipid panel today. Follow-up in 6-8 weeks.         Endocrine   Hypothyroidism    Check TSH today. She had a partial thyroidectomy and is not currently taking any medication.       Relevant Orders   TSH     Other   Hyperlipidemia    Chronic, stable.  Continue atorvastatin 80 mg daily.  Check CMP, CBC, lipid panel today.      Relevant Orders   CBC   Comprehensive metabolic panel   Lipid panel   Morbid obesity ([place])    MI is 53.2.  With history of prediabetes, CAD, CHF, hypertension, will start her on Ozempic as noted above.  Discussed possible side effects.  Follow-up in 6 to 8 weeks.      Relevant Medications   Semaglutide,0.25 or 0.5MG /DOS, (OZEMPIC, 0.25 OR 0.5 MG/DOSE,) 2 MG/1.5ML SOPN   Prediabetes    Most recent A1c was 6.1%.  Her  BMI is 53.2.  With her history of high blood pressure, CHF, CAD, hyperlipidemia, will start her on Ozempic 0.25 mg injection weekly for 4 weeks and then increase to 0.5 mg injection weekly.  She states this medication was approved for her last year, however she did not start it.  Discussed possible side effects.  Follow-up in 6 to 8 weeks.      Other Visit Diagnoses     Dizziness       Occurring with brain zap.  Less frequent since last visit.  MRI was negative.  Will check B12 today   Relevant Orders   Vitamin B12       Return in about 6 weeks (around 10/22/2022) for 6-8 weeks, prediabetes.    Lisa Dancer, NP

## 2022-09-10 NOTE — Assessment & Plan Note (Signed)
MI is 53.2.  With history of prediabetes, CAD, CHF, hypertension, will start her on Ozempic as noted above.  Discussed possible side effects.  Follow-up in 6 to 8 weeks.

## 2022-09-10 NOTE — Assessment & Plan Note (Signed)
Chronic, stable.  Euvolemic on exam today.  Continue losartan 50 mg daily and Toprol-XL 50 mg daily.  Most recent echocardiogram showed an EF of 65 to 70%. 

## 2022-09-10 NOTE — Assessment & Plan Note (Signed)
Chronic, stable.  Continue atorvastatin 80 mg daily.  Check CMP, CBC, lipid panel today.

## 2022-09-10 NOTE — Assessment & Plan Note (Signed)
Chronic, stable.  Continue metoprolol 50 mg daily and losartan 50 mg daily.  Check CMP, CBC today.  Follow-up in 6 months.

## 2022-09-10 NOTE — Assessment & Plan Note (Signed)
Most recent A1c was 6.1%.  Her BMI is 53.2.  With her history of high blood pressure, CHF, CAD, hyperlipidemia, will start her on Ozempic 0.25 mg injection weekly for 4 weeks and then increase to 0.5 mg injection weekly.  She states this medication was approved for her last year, however she did not start it.  Discussed possible side effects.  Follow-up in 6 to 8 weeks.

## 2022-09-10 NOTE — Assessment & Plan Note (Signed)
Chronic, stable.  She is currently chest pain-free.  Will have her continue atorvastatin 80 mg daily, aspirin 81 mg daily, Toprol-XL 50 mg daily, and losartan 50 mg daily.  Will also have her start ozempic 0.25mg  injection weekly for 4 weeks, then increase to 0.5mg  injection weekly. Check CMP, CBC, lipid panel today. Follow-up in 6-8 weeks.

## 2022-09-11 LAB — COMPREHENSIVE METABOLIC PANEL
ALT: 14 U/L (ref 0–35)
AST: 13 U/L (ref 0–37)
Albumin: 4 g/dL (ref 3.5–5.2)
Alkaline Phosphatase: 127 U/L — ABNORMAL HIGH (ref 39–117)
BUN: 18 mg/dL (ref 6–23)
CO2: 26 mEq/L (ref 19–32)
Calcium: 9 mg/dL (ref 8.4–10.5)
Chloride: 105 mEq/L (ref 96–112)
Creatinine, Ser: 1.03 mg/dL (ref 0.40–1.20)
GFR: 53.84 mL/min — ABNORMAL LOW (ref >60.00)
Glucose, Bld: 168 mg/dL — ABNORMAL HIGH (ref 70–99)
Potassium: 4.5 mEq/L (ref 3.5–5.1)
Sodium: 141 mEq/L (ref 135–145)
Total Bilirubin: 1 mg/dL (ref 0.2–1.2)
Total Protein: 6.5 g/dL (ref 6.0–8.3)

## 2022-09-11 LAB — TSH: TSH: 1.25 u[IU]/mL (ref 0.35–5.50)

## 2022-09-11 LAB — LIPID PANEL
Cholesterol: 135 mg/dL (ref 0–200)
HDL: 59.1 mg/dL (ref >39.00)
LDL Cholesterol: 41 mg/dL (ref 0–99)
NonHDL: 76.28
Total CHOL/HDL Ratio: 2
Triglycerides: 174 mg/dL — ABNORMAL HIGH (ref 0.0–149.0)
VLDL: 34.8 mg/dL (ref 0.0–40.0)

## 2022-09-11 LAB — CBC
HCT: 41.4 % (ref 36.0–46.0)
Hemoglobin: 13.7 g/dL (ref 12.0–15.0)
MCHC: 33.1 g/dL (ref 30.0–36.0)
MCV: 93 fl (ref 78.0–100.0)
Platelets: 218 10*3/uL (ref 150.0–400.0)
RBC: 4.46 Mil/uL (ref 3.87–5.11)
RDW: 14.2 % (ref 11.5–15.5)
WBC: 7 10*3/uL (ref 4.0–10.5)

## 2022-09-11 LAB — VITAMIN B12: Vitamin B-12: 252 pg/mL (ref 211–911)

## 2022-09-29 ENCOUNTER — Other Ambulatory Visit: Payer: Self-pay | Admitting: Nurse Practitioner

## 2022-09-29 DIAGNOSIS — Z1231 Encounter for screening mammogram for malignant neoplasm of breast: Secondary | ICD-10-CM

## 2022-10-01 ENCOUNTER — Telehealth: Payer: Self-pay | Admitting: Nurse Practitioner

## 2022-10-01 MED ORDER — OZEMPIC (0.25 OR 0.5 MG/DOSE) 2 MG/1.5ML ~~LOC~~ SOPN: 0.5000 mg | PEN_INJECTOR | SUBCUTANEOUS | 1 refills | Status: DC

## 2022-10-01 NOTE — Telephone Encounter (Signed)
Patient notified of below message and I asked her regarding insurance and she and her sister in law have the same type of insurance and that her pharmacist suggested too for the 90 day supply.

## 2022-10-01 NOTE — Telephone Encounter (Signed)
Pt is wanting a change with her Semaglutide,0.25 or 0.5MG /DOS, (OZEMPIC, 0.25 OR 0.5 MG/DOSE,) 2 MG/1.5ML SOPN [[id number]]. This $94's and she knows someone who gets a 3 month supply for $90's. She wants this.   [id number] [name], [name] - [address] AT [facility] [address], [place] [address] Phone: [phone]  Fax: [phone] DEA #: [id number]   Pt at [phone] [facility])

## 2022-10-13 ENCOUNTER — Ambulatory Visit
Admission: RE | Admit: 2022-10-13 | Discharge: 2022-10-13 | Disposition: A | Discharge status: Home / Self Care | Payer: PPO | Source: Ambulatory Visit | Attending: Nurse Practitioner | Admitting: Nurse Practitioner

## 2022-10-13 ENCOUNTER — Encounter: Payer: Self-pay | Admitting: Nurse Practitioner

## 2022-10-13 ENCOUNTER — Ambulatory Visit (INDEPENDENT_AMBULATORY_CARE_PROVIDER_SITE_OTHER): Payer: PPO | Admitting: Nurse Practitioner

## 2022-10-13 VITALS — BP 126/80 | HR 77 | Temp 97.6°F | Ht 61.0 in | Wt 267.0 lb

## 2022-10-13 DIAGNOSIS — Z1231 Encounter for screening mammogram for malignant neoplasm of breast: Secondary | ICD-10-CM | POA: Diagnosis not present

## 2022-10-13 DIAGNOSIS — R519 Headache, unspecified: Secondary | ICD-10-CM

## 2022-10-13 DIAGNOSIS — R7303 Prediabetes: Secondary | ICD-10-CM

## 2022-10-13 DIAGNOSIS — G8929 Other chronic pain: Secondary | ICD-10-CM | POA: Diagnosis not present

## 2022-10-13 NOTE — Progress Notes (Signed)
Established Patient Office Visit  Subjective   Patient ID: [name], female    DOB: [birth year]  Age: 72 y.o. MRN: [id number]  Chief Complaint  Patient presents with   Weight Check    Diabetes and headache follow up, no concerns    HPI  [name] is here to follow-up on prediabetes, dizziness, and weight.   She states that she is doing well on the ozempic.  She has lost 14 pounds since her last visit.  She is having some constipation. She does not drink too much water.  She does not check her blood sugars at home.  She states that her headaches and dizziness have gotten less frequent, however happening about once a week.  She states the pain lasts for a few seconds and then goes away and then she will get dizzy for about a minute which then goes away.  She states that she does have a history of shingles in the same area.  She had an MRI that was negative for acute findings.    ROS See pertinent positives and negatives per HPI.    Objective:     BP 126/80   Pulse 77   Temp 97.6 F (36.4 C) (Oral)   Ht  (1.549 m)   Wt 267 lb (121.1 kg)   SpO2 97%   BMI 50.45 kg/m  BP Readings from Last 3 Encounters:  10/13/22 126/80  09/10/22 122/78  07/14/22 130/78   Wt Readings from Last 3 Encounters:  10/13/22 267 lb (121.1 kg)  09/10/22 281 lb 9.6 oz (127.7 kg)  07/14/22 277 lb 3.2 oz (125.7 kg)      Physical Exam Vitals and nursing note reviewed.  Constitutional:      General: She is not in acute distress.    Appearance: Normal appearance.  HENT:     Head: Normocephalic.  Eyes:     Conjunctiva/sclera: Conjunctivae normal.  Cardiovascular:     Rate and Rhythm: Normal rate and regular rhythm.     Pulses: Normal pulses.     Heart sounds: Normal heart sounds.  Pulmonary:     Effort: Pulmonary effort is normal.     Breath sounds: Normal breath sounds.  Musculoskeletal:     Cervical back: Normal range of motion.  Skin:    General: Skin is  warm.  Neurological:     General: No focal deficit present.     Mental Status: She is alert and oriented to person, place, and time.  Psychiatric:        Mood and Affect: Mood normal.        Behavior: Behavior normal.        Thought Content: Thought content normal.        Judgment: Judgment normal.     The 10-year ASCVD risk score (Arnett DK, et al., 2019) is: 39.6%    Assessment & Plan:   Problem List Items Addressed This Visit       Other   Morbid obesity    She has lost 14 pounds since her last visit, congratulated her on this!  Continue Ozempic 0.5 mg injection once a week.      Prediabetes - Primary    Chronic, stable.  She is tolerating the Ozempic with some mild constipation.  Will have her start MiraLAX daily and increase her water.  She is currently taking Ozempic 0.5 mg injection weekly, she can continue this.  She is currently lost 14 pounds, congratulated her  on this!  Will have her follow-up in 3 months.      Chronic intractable headache    Chronic, improving. She states that her headaches have become less frequent.  Her MRI was negative for acute findings.  It did show chronic small vessel disease.  Her lipid panel is normal and her LDL is 41.  Will have her continue atorvastatin 80 mg daily.  Since her headaches are improving, will hold off on referral to neurology at this point.  If they worsen or are still ongoing, will place referral to neurology.  Follow-up in 3 months.       Return in about 3 months (around 01/12/2023) for prediabetes, weight management.    Gerre Scull, NP

## 2022-10-13 NOTE — Assessment & Plan Note (Signed)
Chronic, stable.  She is tolerating the Ozempic with some mild constipation.  Will have her start MiraLAX daily and increase her water.  She is currently taking Ozempic 0.5 mg injection weekly, she can continue this.  She is currently lost 14 pounds, congratulated her on this!  Will have her follow-up in 3 months.

## 2022-10-13 NOTE — Assessment & Plan Note (Addendum)
Chronic, improving. She states that her headaches have become less frequent.  Her MRI was negative for acute findings.  It did show chronic small vessel disease.  Her lipid panel is normal and her LDL is 41.  Will have her continue atorvastatin 80 mg daily.  Since her headaches are improving, will hold off on referral to neurology at this point.  If they worsen or are still ongoing, will place referral to neurology.  Follow-up in 3 months.

## 2022-10-13 NOTE — Assessment & Plan Note (Signed)
She has lost 14 pounds since her last visit, congratulated her on this!  Continue Ozempic 0.5 mg injection once a week.

## 2022-10-13 NOTE — Patient Instructions (Signed)
It was great to see you!  Increase the amount of fluid you are drinking.   Start miralax once a day as needed.  Keep doing the ozempic 0.5mg  injection weekly.   Let's follow-up in 3 months, sooner if you have concerns.  If a referral was placed today, you will be contacted for an appointment. Please note that routine referrals can sometimes take up to 3-4 weeks to process. Please call our office if you haven't heard anything after this time frame.  Take care,  Rodman Pickle, NP

## 2022-10-15 DIAGNOSIS — G894 Chronic pain syndrome: Secondary | ICD-10-CM | POA: Diagnosis not present

## 2022-10-15 DIAGNOSIS — M7542 Impingement syndrome of left shoulder: Secondary | ICD-10-CM | POA: Diagnosis not present

## 2022-10-15 DIAGNOSIS — Z79891 Long term (current) use of opiate analgesic: Secondary | ICD-10-CM | POA: Diagnosis not present

## 2022-10-15 DIAGNOSIS — M5136 Other intervertebral disc degeneration, lumbar region: Secondary | ICD-10-CM | POA: Diagnosis not present

## 2022-10-15 DIAGNOSIS — M5416 Radiculopathy, lumbar region: Secondary | ICD-10-CM | POA: Diagnosis not present

## 2022-10-15 DIAGNOSIS — M5432 Sciatica, left side: Secondary | ICD-10-CM | POA: Diagnosis not present

## 2022-10-30 DIAGNOSIS — L538 Other specified erythematous conditions: Secondary | ICD-10-CM | POA: Diagnosis not present

## 2022-10-30 DIAGNOSIS — L820 Inflamed seborrheic keratosis: Secondary | ICD-10-CM | POA: Diagnosis not present

## 2022-10-30 DIAGNOSIS — L298 Other pruritus: Secondary | ICD-10-CM | POA: Diagnosis not present

## 2022-10-30 DIAGNOSIS — Z08 Encounter for follow-up examination after completed treatment for malignant neoplasm: Secondary | ICD-10-CM | POA: Diagnosis not present

## 2022-10-30 DIAGNOSIS — L853 Xerosis cutis: Secondary | ICD-10-CM | POA: Diagnosis not present

## 2022-10-30 DIAGNOSIS — Z85828 Personal history of other malignant neoplasm of skin: Secondary | ICD-10-CM | POA: Diagnosis not present

## 2022-11-06 ENCOUNTER — Telehealth: Payer: Self-pay

## 2022-11-06 NOTE — Patient Instructions (Signed)
Visit Information  Thank you for taking time to visit with me today. Please don't hesitate to contact me if I can be of assistance to you.   Following are the goals we discussed today:   Goals Addressed             This Visit's Progress    COMPLETED: Care Coordionation Activities- No follow up required       Care Coordination Interventions: Advised patient to Annual Wellness exam. Discussed [facility] services and support. Assessed SDOH. Advised to discuss with primary care physician if services needed in the future.          If you are experiencing a Mental Health or Behavioral Health Crisis or need someone to talk to, please call the Suicide and Crisis Lifeline: 988   Patient verbalizes understanding of instructions and care plan provided today and agrees to view in MyChart. Active MyChart status and patient understanding of how to access instructions and care plan via MyChart confirmed with patient.     The patient has been provided with contact information for the care management team and has been advised to call with any health related questions or concerns.   Bary Leriche, RN, MSN [facility] Care Management Care Management Coordinator Direct Line [phone]

## 2022-11-06 NOTE — Patient Outreach (Signed)
  Care Coordination   Initial Visit Note   11/06/2022 Name: [name] MRN: [id number] DOB: 1951-02-14  [name] is a 72 y.o. year old female who sees McElwee, Lauren A, NP for primary care. I spoke with  [name] by phone today.  What matters to the patients health and wellness today?  Stay healthy    Goals Addressed             This Visit's Progress    COMPLETED: Care Coordionation Activities- No follow up required       Care Coordination Interventions: Advised patient to Annual Wellness exam. Discussed [facility] services and support. Assessed SDOH. Advised to discuss with primary care physician if services needed in the future.         SDOH assessments and interventions completed:  Yes  SDOH Interventions Today    Flowsheet Row Most Recent Value  SDOH Interventions   Housing Interventions Intervention Not Indicated  Transportation Interventions Intervention Not Indicated        Care Coordination Interventions:  Yes, provided   Follow up plan: No further intervention required.   Encounter Outcome:  Pt. Visit Completed   Bary Leriche, RN, MSN [facility] Care Management Care Management Coordinator Direct Line [phone]

## 2022-11-21 NOTE — Progress Notes (Unsigned)
Cardiology Office Note:    Date:  11/24/2022   ID:  [name], DOB [birth year], MRN [id number]  PCP:  Gerre Scull, NP   [facility] Providers Cardiologist:  Lesleigh Noe, MD (Inactive)     Referring MD: Gerre Scull, NP   No chief complaint on file.   History of Present Illness:    [name] is a 72 y.o. female seen to establish cardiac care for CAD. Formerly seen by Dr [name]. She has a history of hypertension, obesity, hyperlipidemia, DM II, July 2021 unstable angina treated with DES of the first diagonal with good result.  Intolerant of Brilinta due to dyspnea.   She states that 3 weeks ago she had a funny feeling in her chest with some pressure. Lasted about an hour. Has been under a lot of stress with her daughter's social situation. No chest pain since then. Does not have Ntg to take. She is sedentary. [facility] just [facility] [employer] to do [employer]. Has back issues and gets periodic injections by Dr Ethelene Hal.     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  Discussed as is on low dose, would be reasonable to discontinue and recheck in 6-8 weeks which she would like to do. Last Assessment & Plan:  States was started after a partial thyroidectomy but does not recall any abnormal lab value and no symptoms.  Discussed as    Lumbar spondylosis 07/09/2017   Memory disorder 10/30/2015   Obesity, morbid, BMI 40.0-49.9 (HCC) 06/02/2014   Osteoporosis 05/02/2013   Overview:  DEXA 11/14 DEXA  11/14   Prediabetes    Restless legs syndrome 05/25/2012   Last Assessment & Plan:  patient reports using clonazepam about once weekly .  Discussed nonpharmacologic tips for treating RLS.  States other medicatiosn she had treid had not been effective (does not remember name) Will refill for now.  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   URI, acute 09/20/2015   Vitamin B 12 deficiency    Vitamin D deficiency 05/25/2012    Past Surgical History:  Procedure Laterality Date   ABDOMINAL HYSTERECTOMY     APPENDECTOMY     CARDIAC CATHETERIZATION  12/23/2019   CATARACT EXTRACTION     CHOLECYSTECTOMY     CORONARY STENT INTERVENTION N/A 12/23/2019   Procedure: CORONARY STENT INTERVENTION;  Surgeon: Lyn Records, MD;  Location: [facility] INVASIVE CV [facility];  Service: Cardiovascular;  Laterality: N/A;   JOINT REPLACEMENT     2 total knee replacement   LASIK     LEFT HEART CATH AND CORONARY ANGIOGRAPHY N/A 12/23/2019   Procedure: LEFT HEART CATH AND CORONARY ANGIOGRAPHY;  Surgeon: Lyn Records, MD;  Location: [facility]AS[facility] CV [facility];  Service: Cardiovascular;  Laterality: N/A;   REVERSE SHOULDER ARTHROPLASTY Right 04/03/2020   Procedure: REVERSE SHOULDER ARTHROPLASTY;  Surgeon: Yolonda Kida, MD;  Location: [facility] ORS;  Service: Orthopedics;  Laterality: Right;  2.5 hrs   SHOULDER ARTHROSCOPY WITH ROTATOR CUFF REPAIR AND SUBACROMIAL DECOMPRESSION Left 10/25/2020   Procedure: Left shoulder arthroscopic, extensive debridement, subacromial decompression;  Surgeon: Yolonda Kida, MD;  Location: [facility] OR;  Service: Orthopedics;  Laterality: Left;  90 mins   THYROIDECTOMY     TONSILLECTOMY      Current Medications: Current Meds  Medication Sig   aspirin 81 MG EC tablet Take 81 mg by mouth daily.   atorvastatin (LIPITOR) 80 MG tablet TAKE 1 TABLET(80 MG) BY MOUTH DAILY   clonazePAM (KLONOPIN) 1 MG  tablet Take 1 mg by mouth at bedtime as needed (restless leg).    HYDROcodone-acetaminophen (NORCO) 10-325 MG tablet Take 1 tablet by mouth 4 (four) times daily as needed.   losartan (COZAAR) 50 MG tablet Take 1 tablet (50 mg total) by mouth daily.   metoprolol succinate (TOPROL-XL) 50 MG 24 hr tablet Take 1 tablet (50 mg total) by mouth daily.   Multiple Vitamins-Minerals (CENTRUM SILVER 50+WOMEN) TABS Take 1 tablet by mouth daily.   nitroGLYCERIN (NITROSTAT) 0.4 MG SL tablet Place 1 tablet (0.4 mg total) under the tongue every 5 (five) minutes as needed for chest pain.   ondansetron (ZOFRAN ODT) 4 MG disintegrating tablet Take 1 tablet (4 mg total) by mouth every 8 (eight) hours as needed.   pantoprazole (PROTONIX) 40 MG tablet Take 1 tablet (40 mg total) by mouth daily.   Semaglutide,0.25 or 0.5MG /DOS, (OZEMPIC, 0.25 OR 0.5 MG/DOSE,) 2 MG/1.5ML SOPN Inject 0.5 mg into the skin once a week.     Allergies:   Patient has no known allergies.   Social History   Socioeconomic History   Marital status: Divorced    Spouse name: Not on file   Number of children: 1   Years of education: Not on file   Highest education level: Not on file  Occupational History   Not on file  Tobacco Use   Smoking status: Former    Types: Cigarettes    Quit date: 02/17/1987    Years since quitting: 35.7   Smokeless tobacco: Never  Vaping Use   Vaping Use: Never used  Substance and Sexual Activity   Alcohol use: No   Drug use: No   Sexual activity: Not Currently    Birth control/protection: Post-menopausal, Surgical    Comment: Hysterectomy  Other Topics Concern   Not on file  Social History Narrative      Social Determinants of Health   Financial Resource Strain: Not on file  Food Insecurity: Not on file  Transportation Needs: No Transportation Needs (11/06/2022)   PRAPARE - Administrator, Civil Service (Medical): No    Lack of Transportation (Non-Medical): No  Physical Activity: Not  on file  Stress: Not on file  Social Connections: Not on file     Family History: The patient's family history includes Cancer in her father and mother; Diabetes in her brother, brother, mother, and sister; Heart disease in her brother; Hypertension in her mother. There is no history of Breast cancer.  ROS:   Please see the history of present illness.     All other systems reviewed and are negative.  EKGs/Labs/Other Studies Reviewed:    Cardiac cath 12/23/19:  CORONARY STENT INTERVENTION  LEFT HEART CATH AND CORONARY ANGIOGRAPHY   Conclusion  2-week history of progressive new onset angina. 99% large first diagonal treated with  2.75 x 15 Onyx postdilated to 3.0 mm and 0% stenosis. Left main is widely patent LAD contains mid eccentric 50% stenosis. Circumflex gives 1 large tortuous obtuse marginal and is free of disease. Dominant with luminal irregularities in the mid body.  No significant obstruction. Very mild mid anterior wall hypokinesis.  EF 50 to 55% which is mildly reduced for female.  LVEDP is 19 mmHg.   RECOMMENDATIONS:   Dual antiplatelet therapy for at least 6 months.  Initially aspirin and Brilinta.  After 4 to 6 weeks Brilinta could be decreased in intensity to clopidogrel plus aspirin. High intensity statin therapy added. Low-dose beta-blocker therapy added. Overnight observation since we have both radial and femoral access during the procedure.re reviewed today:   Echo 06/11/21:  IMPRESSIONS     1. Left ventricular ejection fraction, by estimation, is 65 to 70%. The  left ventricle has normal function. The left ventricle has no regional  wall motion abnormalities. Left ventricular diastolic parameters were  normal.   2. Right ventricular systolic function is normal. The right ventricular  size is normal. Tricuspid regurgitation signal is inadequate for assessing  PA pressure.   3. The mitral valve is grossly normal. No evidence of mitral valve   regurgitation. No evidence of mitral stenosis.   4. The aortic valve is grossly normal. Aortic valve regurgitation is not  visualized. No aortic stenosis is present.   5. The inferior vena cava is normal in size with <50% respiratory  variability, suggesting right atrial pressure of 8 mmHg.   Comparison(s): No prior Echocardiogram.    MYOCARDIAL IMAGING WITH SPECT (REST AND PHARMACOLOGIC-STRESS - 2 DAY PROTOCOL)   GATED LEFT VENTRICULAR WALL MOTION STUDY   LEFT VENTRICULAR EJECTION FRACTION   TECHNIQUE: Standard myocardial SPECT imaging was performed after resting intravenous injection of 30 mCi Tc-22m tetrofosmin. Subsequently, on a second day, intravenous infusion of Lexiscan was performed under the supervision of the Cardiology staff. At peak effect of the drug, 30 mCi Tc-64m tetrofosmin was injected intravenously and standard myocardial SPECT imaging was performed. Quantitative gated imaging was also performed to evaluate left ventricular wall motion, and estimate left ventricular ejection fraction.   COMPARISON:  Chest radiograph 06/10/2021   FINDINGS: Perfusion: There is moderate decreased perfusion to the anterior and anterolateral wall involving the apical and mid segment. This decreased perfusion is more prominent on the rest imaging than the stress imaging. Findings indicate a prior scar/infarction.   No evidence reversible ischemia.   Wall Motion: Normal left ventricular wall motion. No left ventricular dilation.   Left Ventricular Ejection Fraction: Greater than 70 %   End diastolic volume 67 ml   End systolic volume 19 ml   IMPRESSION: 1. No reversible ischemia.  Suspect anterolateral infarct.   2. Normal left ventricular wall motion.   3. Left ventricular ejection fraction greater than 70%   4. Non invasive risk stratification*: Low   *2012 Appropriate Use Criteria for Coronary Revascularization Focused Update: J Am Coll Cardiol.  2012;59(9):857-881. http://content.dementiazones.com.aspx?articleid=1201161     Electronically Signed   By: Genevive Bi M.D.   On: 06/12/2021 10:04   Monitor 03/14/22: Study Highlights      NSR with average HR 67 bpm   PVC burden 1.3%   Moderate correlation between PVC's and diary complaints   No atrial fibrillation / flutter etc.     Patch Wear Time:  12 days and 23 hours (2023-09-02T11:44:05-399 to 2023-09-15T10:55:58-0400)   Patient had a min HR of 49 bpm, max  HR of 129 bpm, and avg HR of 67 bpm. Predominant underlying rhythm was Sinus Rhythm. 1 run of Supraventricular Tachycardia occurred lasting 5 beats with a max rate of 129 bpm (avg 109 bpm). Isolated SVEs were rare  (<1.0%), SVE Couplets were rare (<1.0%), and SVE Triplets were rare (<1.0%). Isolated VEs were occasional (1.2%, 14303), VE Couplets were rare (<1.0%, 25), and no VE Triplets were present. Ventricular Bigeminy and Trigeminy were present.     EKG:  EKG is not ordered today.  The ekg ordered today demonstrates N/A  Recent Labs: 09/10/2022: ALT 14; BUN 18; Creatinine, Ser 1.03; Hemoglobin 13.7; Platelets 218.0; Potassium 4.5; Sodium 141; TSH 1.25  Recent Lipid Panel    Component Value Date/Time   CHOL 135 09/10/2022 1627   CHOL 135 11/14/2021 0955   TRIG 174.0 (H) 09/10/2022 1627   HDL 59.10 09/10/2022 1627   HDL 70 11/14/2021 0955   CHOLHDL 2 09/10/2022 1627   VLDL 34.8 09/10/2022 1627   LDLCALC 41 09/10/2022 1627   LDLCALC 46 11/14/2021 0955     Risk Assessment/Calculations:                Physical Exam:    VS:  BP 118/72 (BP Location: Left Arm, Patient Position: Sitting)   Pulse 73   Ht 5\' 1"  (1.549 m)   Wt 260 lb 9.6 oz (118.2 kg)   SpO2 96%   BMI 49.24 kg/m     Wt Readings from Last 3 Encounters:  11/24/22 260 lb 9.6 oz (118.2 kg)  10/13/22 267 lb (121.1 kg)  09/10/22 281 lb 9.6 oz (127.7 kg)     GEN:  Well nourished, morbidly obese in no acute distress HEENT:  Normal NECK: No JVD; No carotid bruits LYMPHATICS: No lymphadenopathy CARDIAC: RRR, no murmurs, rubs, gallops RESPIRATORY:  Clear to auscultation without rales, wheezing or rhonchi  ABDOMEN: Soft, non-tender, non-distended MUSCULOSKELETAL:  No edema; No deformity  SKIN: Warm and dry NEUROLOGIC:  Alert and oriented x 3 PSYCHIATRIC:  Normal affect   ASSESSMENT:    1. Coronary artery disease of native artery of native heart with stable angina pectoris (HCC)   2. Essential hypertension   3. Hyperlipidemia LDL goal <70   4. Obesity, morbid, BMI 40.0-49.9 (HCC)    PLAN:    In order of problems listed above:  CAD s/p stenting of the first diagonal in July 2021. Normal Myoview Dec 2022. One recent episode of angina. Resolved. Renewed Rx for Ntg. Will observe for now. If she has any recurrent chest pain will need ischemic evaluation Htn well controlled. HLD. LDL is at goal 41 DM on Ozempic. A1c 6.1% Morbid obesity. Encourage increased aerobic activity and healthy diet.       Follow up 6 months   Medication Adjustments/Labs and Tests Ordered: Current medicines are reviewed at length with the patient today.  Concerns regarding medicines are outlined above.  No orders of the defined types were placed in this encounter.  Meds ordered this encounter  Medications   nitroGLYCERIN (NITROSTAT) 0.4 MG SL tablet    Sig: Place 1 tablet (0.4 mg total) under the tongue every 5 (five) minutes as needed for chest pain.    Dispense:  90 tablet    Refill:  3    There are no Patient Instructions on file for this visit.   Signed, Stanislaus Kaltenbach [place], MD  11/24/2022 10:36 AM    [facility]

## 2022-11-24 ENCOUNTER — Ambulatory Visit: Payer: PPO | Attending: Cardiology | Admitting: Cardiology

## 2022-11-24 ENCOUNTER — Ambulatory Visit (INDEPENDENT_AMBULATORY_CARE_PROVIDER_SITE_OTHER): Payer: PPO

## 2022-11-24 ENCOUNTER — Encounter: Payer: Self-pay | Admitting: Cardiology

## 2022-11-24 VITALS — Ht 61.0 in | Wt 258.0 lb

## 2022-11-24 VITALS — BP 118/72 | HR 73 | Ht 61.0 in | Wt 260.6 lb

## 2022-11-24 DIAGNOSIS — I25118 Atherosclerotic heart disease of native coronary artery with other forms of angina pectoris: Secondary | ICD-10-CM | POA: Diagnosis not present

## 2022-11-24 DIAGNOSIS — E785 Hyperlipidemia, unspecified: Secondary | ICD-10-CM | POA: Diagnosis not present

## 2022-11-24 DIAGNOSIS — I10 Essential (primary) hypertension: Secondary | ICD-10-CM

## 2022-11-24 DIAGNOSIS — Z0000 Encounter for general adult medical examination without abnormal findings: Secondary | ICD-10-CM | POA: Diagnosis not present

## 2022-11-24 MED ORDER — NITROGLYCERIN 0.4 MG SL SUBL: 0.4000 mg | SUBLINGUAL_TABLET | SUBLINGUAL | 3 refills | Status: AC | PRN

## 2022-11-24 NOTE — Patient Instructions (Signed)
Medication Instructions:  Continue same medications *If you need a refill on your cardiac medications before your next appointment, please call your pharmacy*   Lab Work: None ordered    Testing/Procedures: None ordered   Follow-Up: At [facility], you and your health needs are our priority.  As part of our continuing mission to provide you with exceptional heart care, we have created designated Provider Care Teams.  These Care Teams include your primary Cardiologist (physician) and Advanced Practice Providers (APPs -  Physician Assistants and Nurse Practitioners) who all work together to provide you with the care you need, when you need it.  We recommend signing up for the patient portal called "MyChart".  Sign up information is provided on this After Visit Summary.  MyChart is used to connect with patients for Virtual Visits (Telemedicine).  Patients are able to view lab/test results, encounter notes, upcoming appointments, etc.  Non-urgent messages can be sent to your provider as well.   To learn more about what you can do with MyChart, go to [email or web address].    Your next appointment:  6 months    Provider:  Dr.Jordan   

## 2022-11-24 NOTE — Progress Notes (Signed)
I connected with  [name] on 11/24/22 by a audio enabled telemedicine application and verified that I am speaking with the correct person using two identifiers.  Patient Location: Home  Provider Location: Office/Clinic  I discussed the limitations of evaluation and management by telemedicine. The patient expressed understanding and agreed to proceed. Subjective:   [name] is a 72 y.o. female who presents for Medicare Annual (Subsequent) preventive examination.  Review of Systems     Cardiac Risk Factors include: advanced age (>34men, >16 women);dyslipidemia;hypertension;obesity (BMI >30kg/m2)     Objective:    Today's Vitals   11/24/22 1556  Weight: 258 lb (117 kg)  Height: 5\' 1"  (1.549 m)   Body mass index is 48.75 kg/m.     11/24/2022    3:59 PM 06/10/2021    9:08 PM 10/22/2020    9:02 AM 04/03/2020    6:40 PM 03/28/2020   11:51 AM 03/21/2020    4:51 PM 03/21/2020   10:38 AM  Advanced Directives  Does Patient Have a Medical Advance Directive? No No No No No No No  Would patient like information on creating a medical advance directive?  No - Patient declined  No - Patient declined No - Patient declined No - Patient declined No - Guardian declined    Current Medications (verified) Outpatient Encounter Medications as of 11/24/2022  Medication Sig   aspirin 81 MG EC tablet Take 81 mg by mouth daily.   atorvastatin (LIPITOR) 80 MG tablet TAKE 1 TABLET(80 MG) BY MOUTH DAILY   clonazePAM (KLONOPIN) 1 MG tablet Take 1 mg by mouth at bedtime as needed (restless leg).    HYDROcodone-acetaminophen (NORCO) 10-325 MG tablet Take 1 tablet by mouth 4 (four) times daily as needed.   losartan (COZAAR) 50 MG tablet Take 1 tablet (50 mg total) by mouth daily.   metoprolol succinate (TOPROL-XL) 50 MG 24 hr tablet Take 1 tablet (50 mg total) by mouth daily.   Multiple Vitamins-Minerals (CENTRUM SILVER 50+WOMEN) TABS Take 1 tablet by mouth daily.   nitroGLYCERIN  (NITROSTAT) 0.4 MG SL tablet Place 1 tablet (0.4 mg total) under the tongue every 5 (five) minutes as needed for chest pain.   ondansetron (ZOFRAN ODT) 4 MG disintegrating tablet Take 1 tablet (4 mg total) by mouth every 8 (eight) hours as needed.   pantoprazole (PROTONIX) 40 MG tablet Take 1 tablet (40 mg total) by mouth daily.   Semaglutide,0.25 or 0.5MG /DOS, (OZEMPIC, 0.25 OR 0.5 MG/DOSE,) 2 MG/1.5ML SOPN Inject 0.5 mg into the skin once a week.   No facility-administered encounter medications on file as of 11/24/2022.    Allergies (verified) Patient has no known allergies.   History: Past Medical History:  Diagnosis Date   Anosmia 10/30/2015   Atypical facial pain 10/30/2015   Left upper face   Chronic pain syndrome 07/09/2017   Coronary artery disease    Depression    Dyspnea on exertion 09/20/2015   Gastro-esophageal reflux    GERD (gastroesophageal reflux disease) 05/25/2012   Hyperlipidemia    Hypertension    Hypothyroidism 05/25/2012   Last Assessment & Plan:  States was started after a partial thyroidectomy but does not recall any abnormal lab value and no symptoms.  Discussed as is on low dose, would be reasonable to discontinue and recheck in 6-8 weeks which she would like to do. Last Assessment & Plan:  States was started after a partial thyroidectomy but does not recall any abnormal lab value and no symptoms.  Discussed as    Lumbar spondylosis 07/09/2017   Memory disorder 10/30/2015   Obesity, morbid, BMI 40.0-49.9 (HCC) 06/02/2014   Osteoporosis 05/02/2013   Overview:  DEXA 11/14 DEXA 11/14   Prediabetes    Restless legs syndrome 05/25/2012   Last Assessment & Plan:  patient reports using clonazepam about once weekly .  Discussed nonpharmacologic tips for treating RLS.  States other medicatiosn she had treid had not been effective (does not remember name) Will refill for now.  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   URI, acute 09/20/2015   Vitamin B 12 deficiency    Vitamin D deficiency 05/25/2012   Past Surgical History:  Procedure Laterality Date   ABDOMINAL HYSTERECTOMY     APPENDECTOMY     CARDIAC CATHETERIZATION  12/23/2019   CATARACT EXTRACTION     CHOLECYSTECTOMY     CORONARY STENT INTERVENTION N/A 12/23/2019   Procedure: CORONARY STENT INTERVENTION;  Surgeon: Lyn Records, MD;  Location: [facility];  Service: Cardiovascular;  Laterality: N/A;   JOINT REPLACEMENT     2 total knee replacement   LASIK     LEFT HEART CATH AND CORONARY ANGIOGRAPHY N/A 12/23/2019   Procedure: LEFT HEART CATH AND CORONARY ANGIOGRAPHY;  Surgeon: Lyn Records, MD;  Location: [facility];  Service: Cardiovascular;  Laterality: N/A;   REVERSE SHOULDER ARTHROPLASTY Right 04/03/2020   Procedure: REVERSE SHOULDER ARTHROPLASTY;  Surgeon: Yolonda Kida, MD;  Location: [facility];  Service: Orthopedics;  Laterality: Right;  2.5 hrs   SHOULDER ARTHROSCOPY WITH ROTATOR CUFF REPAIR AND SUBACROMIAL DECOMPRESSION Left 10/25/2020   Procedure: Left shoulder arthroscopic, extensive debridement, subacromial decompression;  Surgeon: Yolonda Kida, MD;  Location: [facility];  Service: Orthopedics;  Laterality: Left;  90 mins   THYROIDECTOMY     TONSILLECTOMY     Family History  Problem Relation Age of Onset   Diabetes Mother    Cancer Mother        lung   Hypertension Mother    Cancer Father        lung   Diabetes Sister    Heart disease Brother    Diabetes Brother    Diabetes Brother    Breast cancer Neg Hx    Social History   Socioeconomic History   Marital status: Divorced    Spouse name: Not on file   Number of children: 1   Years of education: Not on file   Highest education level: Not on file  Occupational History   Not on file  Tobacco Use   Smoking status: Former    Types: Cigarettes    Quit date: 02/17/1987    Years since  quitting: 35.7   Smokeless tobacco: Never  Vaping Use   Vaping Use: Never used  Substance and Sexual Activity   Alcohol use: No   Drug use: No   Sexual activity: Not Currently    Birth control/protection: Post-menopausal, Surgical    Comment: Hysterectomy  Other Topics Concern   Not on file  Social History Narrative      Social Determinants of Health   Financial Resource Strain: Low Risk  (11/24/2022)   Overall Financial Resource Strain (CARDIA)    Difficulty of Paying Living Expenses: Not hard at all  Food Insecurity: No Food Insecurity (11/24/2022)   Hunger Vital Sign  Worried About Programme researcher, broadcasting/film/video in the Last Year: Never true    Ran Out of Food in the Last Year: Never true  Transportation Needs: No Transportation Needs (11/24/2022)   PRAPARE - Administrator, Civil Service (Medical): No    Lack of Transportation (Non-Medical): No  Physical Activity: Inactive (11/24/2022)   Exercise Vital Sign    Days of Exercise per Week: 0 days    Minutes of Exercise per Session: 0 min  Stress: No Stress Concern Present (11/24/2022)   [employer] of Occupational Health - Occupational Stress Questionnaire    Feeling of Stress : Only a little  Social Connections: Not on file    Tobacco Counseling Counseling given: Not Answered   Clinical Intake:  Pre-visit preparation completed: Yes  Pain : No/denies pain     Nutritional Status: BMI > 30  Obese Nutritional Risks: None Diabetes: No  How often do you need to have someone help you when you read instructions, pamphlets, or other written materials from your doctor or pharmacy?: 1 - Never  Diabetic? no  Interpreter Needed?: No  Information entered by :: NAllen LPN   Activities of Daily Living    11/24/2022    4:00 PM 07/06/2022    8:53 PM  In your present state of health, do you have any difficulty performing the following activities:  Hearing? 0 0  Vision? 0 0  Difficulty concentrating or making decisions?  0 0  Walking or climbing stairs? 0 0  Dressing or bathing? 0 0  Doing errands, shopping? 0 0  Preparing Food and eating ? N N  Using the Toilet? N N  In the past six months, have you accidently leaked urine? N Y  Do you have problems with loss of bowel control? N N  Managing your Medications? N N  Managing your Finances? N N  Housekeeping or managing your Housekeeping? N N    Patient Care Team: Gerre Scull, NP as PCP - General (Internal Medicine) Lyn Records, MD (Inactive) as PCP - Cardiology (Cardiology) Sheran Luz, MD as Consulting Physician (Physical Medicine and Rehabilitation)  Indicate any recent Medical Services you may have received from other than [facility] providers in the past year (date may be approximate).     Assessment:   This is a routine wellness examination for [name].  Hearing/Vision screen Vision Screening - Comments:: Regular eye exams, [facility]  Dietary issues and exercise activities discussed: Current Exercise Habits: The patient does not participate in regular exercise at present   Goals Addressed             This Visit's Progress    Patient Stated       11/24/2022, no goals       Depression Screen    11/24/2022    4:00 PM 11/06/2022    1:44 PM 04/11/2022    1:59 PM  PHQ 2/9 Scores  PHQ - 2 Score 0 0 0    Fall Risk    11/24/2022    4:00 PM 07/14/2022   10:27 AM 07/06/2022    8:53 PM  Fall Risk   Falls in the past year? 0  0  Number falls in past yr: 0  0  Injury with Fall? 0  1  Risk for fall due to : Medication side effect No Fall Risks   Follow up Falls prevention discussed;Education provided;Falls evaluation completed Falls evaluation completed     FALL RISK PREVENTION PERTAINING TO  THE HOME:  Any stairs in or around the home? Yes  If so, are there any without handrails? No  Home free of loose throw rugs in walkways, pet beds, electrical cords, etc? Yes  Adequate lighting in your home to reduce risk of falls?  Yes   ASSISTIVE DEVICES UTILIZED TO PREVENT FALLS:  Life alert? No  Use of a cane, walker or w/c? No  Grab bars in the bathroom? Yes  Shower chair or bench in shower? No  Elevated toilet seat or a handicapped toilet? Yes   TIMED UP AND GO:  Was the test performed? No .      Cognitive Function:    10/30/2015    9:32 AM  MMSE - Mini Mental State Exam  Orientation to time 5  Orientation to Place 5  Registration 3  Attention/ Calculation 3  Recall 2  Language- name 2 objects 2  Language- repeat 1  Language- follow 3 step command 3  Language- read & follow direction 1  Write a sentence 1  Copy design 1  Total score 27        11/24/2022    4:01 PM  6CIT Screen  What Year? 0 points  What month? 0 points  What time? 0 points  Count back from 20 0 points  Months in reverse 0 points  Repeat phrase 2 points  Total Score 2 points    Immunizations Immunization History  Administered Date(s) Administered   Fluad Quad(high Dose 65+) 04/04/2020, 04/11/2022   Influenza Split 04/26/2012   Influenza, High Dose Seasonal PF 04/26/2014, 04/25/2015, 04/25/2015, 03/29/2016, 03/29/2016   Influenza, Seasonal, Injecte, Preservative Fre 03/21/2013   Influenza-Unspecified 04/26/2012, 03/21/2013, 03/03/2015, 03/03/2015, 04/06/2017, 03/23/2018   PFIZER(Purple Top)SARS-COV-2 Vaccination 08/15/2019, 09/05/2019   Pneumococcal Conjugate-13 08/02/2000, 08/02/2000, 03/25/2015, 03/25/2015   Pneumococcal Polysaccharide-23 01/20/2014, 01/20/2014, 03/03/2015, 03/03/2015   Tdap 05/27/2013, 05/27/2013, 03/21/2020   Zoster, Live 06/28/2012, 06/28/2012, 08/22/2013, 08/22/2013, 03/03/2015, 03/03/2015    TDAP status: Up to date  Flu Vaccine status: Up to date  Pneumococcal vaccine status: Up to date  Covid-19 vaccine status: Completed vaccines  Qualifies for Shingles Vaccine? Yes   Zostavax completed Yes   Shingrix Completed?: No.    Education has been provided regarding the importance of this  vaccine. Patient has been advised to call insurance company to determine out of pocket expense if they have not yet received this vaccine. Advised may also receive vaccine at local pharmacy or Health Dept. Verbalized acceptance and understanding.  Screening Tests Health Maintenance  Topic Date Due   COVID-19 Vaccine (3 - Pfizer risk series) 10/03/2019   Medicare Annual Wellness (AWV)  01/10/2023   Zoster Vaccines- Shingrix (1 of 2) 04/23/2023 (Originally 11/28/1969)   Hepatitis C Screening  10/13/2023 (Originally 11/28/1968)   Diabetic kidney evaluation - Urine ACR  07/15/2027 (Originally 11/28/1968)   INFLUENZA VACCINE  01/22/2023   Diabetic kidney evaluation - eGFR measurement  09/10/2023   MAMMOGRAM  10/12/2024   DTaP/Tdap/Td (4 - Td or Tdap) 03/21/2030   Colonoscopy  09/24/2030   Pneumonia Vaccine 71+ Years old  Completed   DEXA SCAN  Completed   HPV VACCINES  Aged Out    Health Maintenance  Health Maintenance Due  Topic Date Due   COVID-19 Vaccine (3 - Pfizer risk series) 10/03/2019   Medicare Annual Wellness (AWV)  01/10/2023    Colorectal cancer screening: Type of screening: Colonoscopy. Completed 04/13/2019. Repeat every 10 years  Mammogram status: Completed 10/13/2022. Repeat every year  Bone Density  status: Completed 04/29/2013.  Lung Cancer Screening: (Low Dose CT Chest recommended if Age 25-80 years, 30 pack-year currently smoking OR have quit w/in 15years.) does not qualify.   Lung Cancer Screening Referral: no  Additional Screening:  Hepatitis C Screening: does not qualify;   Vision Screening: Recommended annual ophthalmology exams for early detection of glaucoma and other disorders of the eye. Is the patient up to date with their annual eye exam?  Yes  Who is the provider or what is the name of the office in which the patient attends annual eye exams? [facility] If pt is not established with a provider, would they like to be referred to a provider to  establish care? No .   Dental Screening: Recommended annual dental exams for proper oral hygiene  Community Resource Referral / Chronic Care Management: CRR required this visit?  No   CCM required this visit?  No      Plan:     I have personally reviewed and noted the following in the patient's chart:   Medical and social history Use of alcohol, tobacco or illicit drugs  Current medications and supplements including opioid prescriptions. Patient is not currently taking opioid prescriptions. Functional ability and status Nutritional status Physical activity Advanced directives List of other physicians Hospitalizations, surgeries, and ER visits in previous 12 months Vitals Screenings to include cognitive, depression, and falls Referrals and appointments  In addition, I have reviewed and discussed with patient certain preventive protocols, quality metrics, and best practice recommendations. A written personalized care plan for preventive services as well as general preventive health recommendations were provided to patient.     Barb Merino, LPN   [id number]   Nurse Notes: none  Due to this being a virtual visit, the after visit summary with patients personalized plan was offered to patient via mail or my-chart.  Patient would like to access on my-chart

## 2022-11-24 NOTE — Patient Instructions (Signed)
[name] , Thank you for taking time to come for your Medicare Wellness Visit. I appreciate your ongoing commitment to your health goals. Please review the following plan we discussed and let me know if I can assist you in the future.   These are the goals we discussed:  Goals      Patient Stated     11/24/2022, no goals        This is a list of the screening recommended for you and due dates:  Health Maintenance  Topic Date Due   COVID-19 Vaccine (3 - Pfizer risk series) 10/03/2019   Zoster (Shingles) Vaccine (1 of 2) 04/23/2023*   Hepatitis C Screening  10/13/2023*   Yearly kidney health urinalysis for diabetes  07/15/2027*   Flu Shot  01/22/2023   Yearly kidney function blood test for diabetes  09/10/2023   Medicare Annual Wellness Visit  11/24/2023   Mammogram  10/12/2024   DTaP/Tdap/Td vaccine (4 - Td or Tdap) 03/21/2030   Colon Cancer Screening  09/24/2030   Pneumonia Vaccine  Completed   DEXA scan (bone density measurement)  Completed   HPV Vaccine  Aged Out  *Topic was postponed. The date shown is not the original due date.    Advanced directives: Advance directive discussed with you today.   Conditions/risks identified: none  Next appointment: Follow up in one year for your annual wellness visit    Preventive Care 65 Years and Older, Female Preventive care refers to lifestyle choices and visits with your health care provider that can promote health and wellness. What does preventive care include? A yearly physical exam. This is also called an annual well check. Dental exams once or twice a year. Routine eye exams. Ask your health care provider how often you should have your eyes checked. Personal lifestyle choices, including: Daily care of your teeth and gums. Regular physical activity. Eating a healthy diet. Avoiding tobacco and drug use. Limiting alcohol use. Practicing safe sex. Taking low-dose aspirin every day. Taking vitamin and mineral  supplements as recommended by your health care provider. What happens during an annual well check? The services and screenings done by your health care provider during your annual well check will depend on your age, overall health, lifestyle risk factors, and family history of disease. Counseling  Your health care provider may ask you questions about your: Alcohol use. Tobacco use. Drug use. Emotional well-being. Home and relationship well-being. Sexual activity. Eating habits. History of falls. Memory and ability to understand (cognition). Work and work Astronomer. Reproductive health. Screening  You may have the following tests or measurements: Height, weight, and BMI. Blood pressure. Lipid and cholesterol levels. These may be checked every 5 years, or more frequently if you are over 22 years old. Skin check. Lung cancer screening. You may have this screening every year starting at age 66 if you have a 30-pack-year history of smoking and currently smoke or have quit within the past 15 years. Fecal occult blood test (FOBT) of the stool. You may have this test every year starting at age 64. Flexible sigmoidoscopy or colonoscopy. You may have a sigmoidoscopy every 5 years or a colonoscopy every 10 years starting at age 65. Hepatitis C blood test. Hepatitis B blood test. Sexually transmitted disease (STD) testing. Diabetes screening. This is done by checking your blood sugar (glucose) after you have not eaten for a while (fasting). You may have this done every 1-3 years. Bone density scan. This is done to screen for  osteoporosis. You may have this done starting at age 56. Mammogram. This may be done every 1-2 years. Talk to your health care provider about how often you should have regular mammograms. Talk with your health care provider about your test results, treatment options, and if necessary, the need for more tests. Vaccines  Your health care provider may recommend certain  vaccines, such as: Influenza vaccine. This is recommended every year. Tetanus, diphtheria, and acellular pertussis (Tdap, Td) vaccine. You may need a Td booster every 10 years. Zoster vaccine. You may need this after age 25. Pneumococcal 13-valent conjugate (PCV13) vaccine. One dose is recommended after age 70. Pneumococcal polysaccharide (PPSV23) vaccine. One dose is recommended after age 39. Talk to your health care provider about which screenings and vaccines you need and how often you need them. This information is not intended to replace advice given to you by your health care provider. Make sure you discuss any questions you have with your health care provider. Document Released: 07/06/2015 Document Revised: 02/27/2016 Document Reviewed: 04/10/2015 Elsevier Interactive Patient Education  2017 [employer].  Fall Prevention in the Home Falls can cause injuries. They can happen to people of all ages. There are many things you can do to make your home safe and to help prevent falls. What can I do on the outside of my home? Regularly fix the edges of walkways and driveways and fix any cracks. Remove anything that might make you trip as you walk through a door, such as a raised step or threshold. Trim any bushes or trees on the path to your home. Use bright outdoor lighting. Clear any walking paths of anything that might make someone trip, such as rocks or tools. Regularly check to see if handrails are loose or broken. Make sure that both sides of any steps have handrails. Any raised decks and porches should have guardrails on the edges. Have any leaves, snow, or ice cleared regularly. Use sand or salt on walking paths during winter. Clean up any spills in your garage right away. This includes oil or grease spills. What can I do in the bathroom? Use night lights. Install grab bars by the toilet and in the tub and shower. Do not use towel bars as grab bars. Use non-skid mats or decals in  the tub or shower. If you need to sit down in the shower, use a plastic, non-slip stool. Keep the floor dry. Clean up any water that spills on the floor as soon as it happens. Remove soap buildup in the tub or shower regularly. Attach bath mats securely with double-sided non-slip rug tape. Do not have throw rugs and other things on the floor that can make you trip. What can I do in the bedroom? Use night lights. Make sure that you have a light by your bed that is easy to reach. Do not use any sheets or blankets that are too big for your bed. They should not hang down onto the floor. Have a firm chair that has side arms. You can use this for support while you get dressed. Do not have throw rugs and other things on the floor that can make you trip. What can I do in the kitchen? Clean up any spills right away. Avoid walking on wet floors. Keep items that you use a lot in easy-to-reach places. If you need to reach something above you, use a strong step stool that has a grab bar. Keep electrical cords out of the way. Do not  use floor polish or wax that makes floors slippery. If you must use wax, use non-skid floor wax. Do not have throw rugs and other things on the floor that can make you trip. What can I do with my stairs? Do not leave any items on the stairs. Make sure that there are handrails on both sides of the stairs and use them. Fix handrails that are broken or loose. Make sure that handrails are as long as the stairways. Check any carpeting to make sure that it is firmly attached to the stairs. Fix any carpet that is loose or worn. Avoid having throw rugs at the top or bottom of the stairs. If you do have throw rugs, attach them to the floor with carpet tape. Make sure that you have a light switch at the top of the stairs and the bottom of the stairs. If you do not have them, ask someone to add them for you. What else can I do to help prevent falls? Wear shoes that: Do not have high  heels. Have rubber bottoms. Are comfortable and fit you well. Are closed at the toe. Do not wear sandals. If you use a stepladder: Make sure that it is fully opened. Do not climb a closed stepladder. Make sure that both sides of the stepladder are locked into place. Ask someone to hold it for you, if possible. Clearly mark and make sure that you can see: Any grab bars or handrails. First and last steps. Where the edge of each step is. Use tools that help you move around (mobility aids) if they are needed. These include: Canes. Walkers. Scooters. Crutches. Turn on the lights when you go into a dark area. Replace any light bulbs as soon as they burn out. Set up your furniture so you have a clear path. Avoid moving your furniture around. If any of your floors are uneven, fix them. If there are any pets around you, be aware of where they are. Review your medicines with your doctor. Some medicines can make you feel dizzy. This can increase your chance of falling. Ask your doctor what other things that you can do to help prevent falls. This information is not intended to replace advice given to you by your health care provider. Make sure you discuss any questions you have with your health care provider. Document Released: 04/05/2009 Document Revised: 11/15/2015 Document Reviewed: 07/14/2014 Elsevier Interactive Patient Education  2017 [employer].

## 2023-01-12 ENCOUNTER — Ambulatory Visit (INDEPENDENT_AMBULATORY_CARE_PROVIDER_SITE_OTHER): Payer: PPO | Admitting: Nurse Practitioner

## 2023-01-12 ENCOUNTER — Encounter: Payer: Self-pay | Admitting: Nurse Practitioner

## 2023-01-12 VITALS — BP 114/84 | HR 80 | Temp 97.6°F | Ht 61.0 in | Wt 250.0 lb

## 2023-01-12 DIAGNOSIS — R42 Dizziness and giddiness: Secondary | ICD-10-CM

## 2023-01-12 DIAGNOSIS — H6121 Impacted cerumen, right ear: Secondary | ICD-10-CM | POA: Diagnosis not present

## 2023-01-12 DIAGNOSIS — R7303 Prediabetes: Secondary | ICD-10-CM

## 2023-01-12 DIAGNOSIS — G8929 Other chronic pain: Secondary | ICD-10-CM | POA: Diagnosis not present

## 2023-01-12 DIAGNOSIS — R519 Headache, unspecified: Secondary | ICD-10-CM

## 2023-01-12 DIAGNOSIS — Z6841 Body Mass Index (BMI) 40.0 and over, adult: Secondary | ICD-10-CM

## 2023-01-12 LAB — POCT GLYCOSYLATED HEMOGLOBIN (HGB A1C)
HbA1c POC (<> result, manual entry): 5.5 % (ref 4.0–5.6)
HbA1c, POC (controlled diabetic range): 5.5 % (ref 0.0–7.0)
HbA1c, POC (prediabetic range): 5.5 % — AB (ref 5.7–6.4)
Hemoglobin A1C: 5.5 % (ref 4.0–5.6)

## 2023-01-12 NOTE — Assessment & Plan Note (Signed)
Chronic, stable.  Her A1c today is 5.5%.  She is having some constipation and not eating a lot with her Ozempic, we will have her decrease down to 0.25 mg injection weekly.  She has lost 8 pounds since her visit last month.  Encouraged her to start again plenty of water, she can continue MiraLAX over-the-counter as needed.  Follow-up in 3 months.

## 2023-01-12 NOTE — Assessment & Plan Note (Signed)
Chronic, ongoing. She is still having random, intermittent headaches associated with dizziness.  Her MRI was negative for acute findings.  It did show chronic small vessel disease.  Her lipid panel is normal and her LDL is 41.  Will have her continue atorvastatin 80 mg daily.  Will place a referral to neurology for further evaluation.  Follow-up in 3 months.

## 2023-01-12 NOTE — Patient Instructions (Signed)
It was great to see you!  Decrease your ozempic to 0.25mg  injection weekly. Increase the amount of water you are drinking.   I have placed a referral to neurology, they will call to schedule.   Let's follow-up in 3 months, sooner if you have concerns.  If a referral was placed today, you will be contacted for an appointment. Please note that routine referrals can sometimes take up to 3-4 weeks to process. Please call our office if you haven't heard anything after this time frame.  Take care,  Rodman Pickle, NP

## 2023-01-12 NOTE — Assessment & Plan Note (Signed)
She has lost another 8 pounds since her last visit. She is having trouble eating more than 1 meal a day. Will decrease her ozempic back down to 0.25mg  injection weekly.

## 2023-01-12 NOTE — Progress Notes (Signed)
Established Patient Office Visit  Subjective   Patient ID: [name], female    DOB: [birth year]  Age: 72 y.o. MRN: [id number]  Chief Complaint  Patient presents with   Dizziness    For 1 year, pain in head randomly, follow up with weight     HPI  [name] is here to follow-up on prediabetes, obesity, and headache associated with dizziness.  She states that she is doing well.  She has continued to lose weight with the Ozempic and is taking 0.5 mg injection weekly.  She has noticed that her appetite is down and she has been having some trouble with constipation.  She has been trying over-the-counter MiraLAX, Colace, psyllium husk, and enemas.  She denies abdominal pain, nausea, vomiting.  She is not checking her blood sugars at home.  She also notes that she is still having the random, intermittent headaches associated with dizziness.  She had an MRI of her head which was negative.  She never knows when they will occur.  She would like referral to a specialist.  She also notes that her right ear feels like there is fluid in it.  She hears some crackling and popping sounds.  She denies pain, fevers.    ROS See pertinent positives and negatives per HPI.    Objective:     BP 114/84 (BP Location: Right Wrist)   Pulse 80   Temp 97.6 F (36.4 C)   Ht 5\' 1"  (1.549 m)   Wt 250 lb (113.4 kg)   SpO2 97%   BMI 47.24 kg/m  BP Readings from Last 3 Encounters:  01/12/23 114/84  11/24/22 118/72  10/13/22 126/80   Wt Readings from Last 3 Encounters:  01/12/23 250 lb (113.4 kg)  11/24/22 258 lb (117 kg)  11/24/22 260 lb 9.6 oz (118.2 kg)      Physical Exam Vitals and nursing note reviewed.  Constitutional:      General: She is not in acute distress.    Appearance: Normal appearance. She is obese.  HENT:     Head: Normocephalic.     Right Ear: External ear normal. There is impacted cerumen.     Left Ear: Tympanic membrane, ear canal and external ear  normal.  Eyes:     Conjunctiva/sclera: Conjunctivae normal.  Cardiovascular:     Rate and Rhythm: Normal rate and regular rhythm.     Pulses: Normal pulses.     Heart sounds: Normal heart sounds.  Pulmonary:     Effort: Pulmonary effort is normal.     Breath sounds: Normal breath sounds.  Musculoskeletal:     Cervical back: Normal range of motion.  Skin:    General: Skin is warm.  Neurological:     General: No focal deficit present.     Mental Status: She is alert and oriented to person, place, and time.  Psychiatric:        Mood and Affect: Mood normal.        Behavior: Behavior normal.        Thought Content: Thought content normal.        Judgment: Judgment normal.      Results for orders placed or performed in visit on 01/12/23  POCT glycosylated hemoglobin (Hb A1C)  Result Value Ref Range   Hemoglobin A1C 5.5 4.0 - 5.6 %   HbA1c POC (<> result, manual entry) 5.5 4.0 - 5.6 %   HbA1c, POC (prediabetic range) 5.5 (A) 5.7 - 6.4 %  HbA1c, POC (controlled diabetic range) 5.5 0.0 - 7.0 %      The 10-year ASCVD risk score (Arnett DK, et al., 2019) is: 26.4%    Assessment & Plan:   Problem List Items Addressed This Visit       Other   Morbid obesity (HCC)    She has lost another 8 pounds since her last visit. She is having trouble eating more than 1 meal a day. Will decrease her ozempic back down to 0.25mg  injection weekly.       Prediabetes - Primary    Chronic, stable.  Her A1c today is 5.5%.  She is having some constipation and not eating a lot with her Ozempic, we will have her decrease down to 0.25 mg injection weekly.  She has lost 8 pounds since her visit last month.  Encouraged her to start again plenty of water, she can continue MiraLAX over-the-counter as needed.  Follow-up in 3 months.      Relevant Orders   POCT glycosylated hemoglobin (Hb A1C) (Completed)   Chronic intractable headache    Chronic, ongoing. She is still having random, intermittent  headaches associated with dizziness.  Her MRI was negative for acute findings.  It did show chronic small vessel disease.  Her lipid panel is normal and her LDL is 41.  Will have her continue atorvastatin 80 mg daily.  Will place a referral to neurology for further evaluation.  Follow-up in 3 months.      Relevant Orders   Ambulatory referral to Neurology   Other Visit Diagnoses     Dizziness       Associated with headaches. Placing referral to neurology as noted below.   Relevant Orders   Ambulatory referral to Neurology   Impacted cerumen of right ear       After obtaining verbal consent, right ear irrigated without success.  She had some pain associated with this.  Will have her try Debrox over-the-counter       Return in about 3 months (around 04/14/2023) for prediabetes, dizziness.    Gerre Scull, NP

## 2023-03-17 NOTE — Progress Notes (Signed)
This patient is appearing on a report for being at risk of failing the adherence measure for diabetes medications this calendar year.   Medication: Ozempic (semaglutide) 0.25/0.5 mg subcutaneous weekly Last fill date: 10/20/22 for 84 day supply  Appears that patient is taking Ozempic for prediabetes. PCP reduced dose from 0.5 mg weekly to 0.25 mg weekly at most recent appt. Will request for PCP to adjust Rx, and will reach out to patient via MyChart to ensure she is not having access issues with the medication.   Nils Pyle, PharmD PGY1 Pharmacy Resident

## 2023-03-18 NOTE — Progress Notes (Signed)
Pt left VM stating that she is no longer able to afford Ozempic because she is in the donut hole (medication was over $500 for an 84ds). Pt likely would not qualify for patient assistance because she does not have a diagnosis of DM.   No action needed for insurance quality report.   Nils Pyle, PharmD PGY1 Pharmacy Resident

## 2023-03-19 ENCOUNTER — Other Ambulatory Visit: Payer: Self-pay | Admitting: Nurse Practitioner

## 2023-03-19 MED ORDER — OZEMPIC (0.25 OR 0.5 MG/DOSE) 2 MG/1.5ML ~~LOC~~ SOPN: 0.2500 mg | PEN_INJECTOR | SUBCUTANEOUS | Status: DC

## 2023-04-09 ENCOUNTER — Ambulatory Visit: Payer: PPO | Admitting: Neurology

## 2023-04-09 ENCOUNTER — Encounter: Payer: Self-pay | Admitting: Neurology

## 2023-04-09 VITALS — BP 124/79 | HR 72 | Ht 61.0 in | Wt 243.5 lb

## 2023-04-09 DIAGNOSIS — Z8619 Personal history of other infectious and parasitic diseases: Secondary | ICD-10-CM | POA: Diagnosis not present

## 2023-04-09 DIAGNOSIS — G501 Atypical facial pain: Secondary | ICD-10-CM | POA: Diagnosis not present

## 2023-04-09 DIAGNOSIS — R350 Frequency of micturition: Secondary | ICD-10-CM

## 2023-04-09 DIAGNOSIS — R519 Headache, unspecified: Secondary | ICD-10-CM | POA: Diagnosis not present

## 2023-04-09 DIAGNOSIS — G4700 Insomnia, unspecified: Secondary | ICD-10-CM | POA: Diagnosis not present

## 2023-04-09 MED ORDER — IMIPRAMINE HCL 25 MG PO TABS: 25.0000 mg | ORAL_TABLET | Freq: Every day | ORAL | 11 refills | Status: DC

## 2023-04-09 NOTE — Progress Notes (Signed)
[facility]  PATIENT: [name] DOB: October 08, 1950  REFERRING DOCTOR OR PCP:  Rodman Pickle, NP SOURCE: patient,   _________________________________   HISTORICAL  CHIEF COMPLAINT:  Chief Complaint  Patient presents with   Room 11    Pt is here with her Daughter. Pt states that she will have a sharp pain in her head at the top of her forehead. Pt states that after the pain she will feel dizzy and lightheaded. Pt's daughter states that pt had a sharp pain on Sunday in her forehead. Pt states that when she sits down and get back up she will get dizzy. Pt states  that she has Shingles and has taken the shingles Vaccine.     HISTORY OF PRESENT ILLNESS:  I had the pleasure seeing patient, [name], at [facility] for neurologic consultation regarding her atypical headaches.  She s is a 72 year old woman who has had episodes of sharp pain in the right forehead over the past 2 years   Pain is intense and comes on suddenly.  It often lasts 5 minutes or so.  Moving makes the pain worse and staying still improves the pain.   She is having a spell about once a week.      After the pain passes in a few minutes, she feels lightheaded x 5 -10 more minutes.   She feels she might pass out but never does.     She has no pain in between the headahce episodes  Before these episodes, she did not have headaches.    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   Shehas nocturia 2-3 times.    MRI of the brain 08/31/2022 showed mild chronic microvascular ischemic change - a little more than typical for age but not unusual.   Mild atrophy.  No acute findings.      REVIEW OF SYSTEMS: Constitutional: No fevers, chills, sweats, or change in appetite Eyes: No visual changes, double vision, eye pain Ear, nose and throat:  No hearing loss, ear pain, nasal congestion, sore throat Cardiovascular: No chest pain, palpitations Respiratory:  No shortness of breath at rest or with exertion.   No wheezes GastrointestinaI: No nausea, vomiting, diarrhea, abdominal pain, fecal incontinence Genitourinary:  No dysuria, urinary retention or frequency.  No nocturia. Musculoskeletal:  No neck pain, back pain Integumentary: No rash, pruritus, skin lesions Neurological: as above Psychiatric: No depression at this time.  No anxiety Endocrine: No palpitations, diaphoresis, change in appetite, change in weigh or increased thirst Hematologic/Lymphatic:  No anemia, purpura, petechiae. Allergic/Immunologic: No itchy/runny eyes, nasal congestion, recent allergic reactions, rashes  ALLERGIES: No Known Allergies  HOME MEDICATIONS:  Current Outpatient Medications:    aspirin 81 MG EC tablet, Take 81 mg by mouth daily., Disp: , Rfl:    atorvastatin (LIPITOR) 80 MG tablet, TAKE 1 TABLET(80 MG) BY MOUTH DAILY, Disp: 90 tablet, Rfl: 3   HYDROcodone-acetaminophen (NORCO) 10-325 MG tablet, Take 1 tablet by mouth 4 (four) times daily as needed., Disp: , Rfl:    imipramine (TOFRANIL) 25 MG tablet, Take 1 tablet (25 mg total) by mouth at bedtime., Disp: 30 tablet, Rfl: 11   losartan (COZAAR) 50 MG tablet, Take 1 tablet (50 mg total) by mouth daily., Disp: 90 tablet, Rfl: 3   metoprolol succinate (TOPROL-XL) 50 MG 24 hr tablet,  Take 1 tablet (50 mg total) by mouth daily., Disp: 90 tablet, Rfl: 3   Multiple Vitamins-Minerals (CENTRUM SILVER 50+WOMEN) TABS, Take 1 tablet by mouth daily., Disp: , Rfl:    ondansetron (ZOFRAN ODT) 4 MG disintegrating tablet, Take 1 tablet (4 mg total) by mouth every 8 (eight) hours as needed., Disp: 20 tablet, Rfl: 0   pantoprazole (PROTONIX) 40 MG tablet, Take 1 tablet (40 mg total) by mouth daily., Disp: 90 tablet, Rfl: 3   clonazePAM (KLONOPIN) 1 MG tablet, Take 1 mg by mouth at bedtime as needed (restless leg).  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  Discussed as is on low dose, would be reasonable to discontinue and recheck in 6-8 weeks which she would like to do. Last Assessment & Plan:  States was started after a partial thyroidectomy but does not recall any abnormal lab value and no symptoms.  Discussed as    Lumbar spondylosis 07/09/2017   Memory disorder 10/30/2015   Obesity, morbid, BMI 40.0-49.9 (HCC) 06/02/2014   Osteoporosis 05/02/2013   Overview:  DEXA 11/14 DEXA 11/14   Prediabetes    Restless legs syndrome 05/25/2012   Last Assessment & Plan:  patient reports using clonazepam about once weekly .  Discussed nonpharmacologic tips for treating RLS.  States other medicatiosn she had treid had not been effective (does not remember name) Will refill for now.  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   URI, acute 09/20/2015   Vitamin B 12 deficiency    Vitamin D deficiency 05/25/2012    PAST SURGICAL HISTORY: Past Surgical History:  Procedure Laterality Date   ABDOMINAL HYSTERECTOMY      APPENDECTOMY     CARDIAC CATHETERIZATION  12/23/2019   CATARACT EXTRACTION     CHOLECYSTECTOMY     CORONARY STENT INTERVENTION N/A 12/23/2019   Procedure: CORONARY STENT INTERVENTION;  Surgeon: Lyn Records, MD;  Location: [facility];  Service: Cardiovascular;  Laterality: N/A;   JOINT REPLACEMENT     2 total knee replacement   LASIK     LEFT HEART CATH AND CORONARY ANGIOGRAPHY N/A 12/23/2019   Procedure: LEFT HEART CATH AND CORONARY ANGIOGRAPHY;  Surgeon: Lyn Records, MD;  Location: [facility];  Service: Cardiovascular;  Laterality: N/A;   REVERSE SHOULDER ARTHROPLASTY Right 04/03/2020   Procedure: REVERSE SHOULDER ARTHROPLASTY;  Surgeon: Yolonda Kida, MD;  Location: [facility];  Service: Orthopedics;  Laterality: Right;  2.5 hrs   SHOULDER ARTHROSCOPY  WITH ROTATOR CUFF REPAIR AND SUBACROMIAL DECOMPRESSION Left 10/25/2020   Procedure: Left shoulder arthroscopic, extensive debridement, subacromial decompression;  Surgeon: Yolonda Kida, MD;  [facility];  Service: Orthopedics;  Laterality: Left;  90 mins   THYROIDECTOMY     TONSILLECTOMY      FAMILY HISTORY: Family History  Problem Relation Age of Onset   Diabetes Mother    Cancer Mother        lung   Hypertension Mother    Cancer Father        lung   Diabetes Sister    Heart disease Brother    Diabetes Brother    Diabetes Brother    Breast cancer Neg Hx     SOCIAL HISTORY: Social History   Socioeconomic History   Marital status: Divorced    Spouse name: Not on file   Number of children: 1   Years of education: Not on file   Highest education level: Not on file  Occupational History   Not on file  Tobacco Use   Smoking status: Former    Current packs/day: 0.00    Types: Cigarettes    Quit date: 02/17/1987    Years since quitting: 36.1   Smokeless tobacco: Never  Vaping Use   Vaping status: Never Used  Substance and Sexual Activity   Alcohol use: No   Drug use: No   Sexual  activity: Not Currently    Birth control/protection: Post-menopausal, Surgical    Comment: Hysterectomy  Other Topics Concern   Not on file  Social History Narrative      Social Determinants of Health   Financial Resource Strain: Low Risk  (11/24/2022)   Overall Financial Resource Strain (CARDIA)    Difficulty of Paying Living Expenses: Not hard at all  Food Insecurity: No Food Insecurity (11/24/2022)   Hunger Vital Sign    Worried About Running Out of Food in the Last Year: Never true    Ran Out of Food in the Last Year: Never true  Transportation Needs: No Transportation Needs (11/24/2022)   PRAPARE - Administrator, Civil Service (Medical): No    Lack of Transportation (Non-Medical): No  Physical Activity: Inactive (11/24/2022)   Exercise Vital Sign    Days of Exercise per Week: 0 days    Minutes of Exercise per Session: 0 min  Stress: No Stress Concern Present (11/24/2022)   [employer] of Occupational Health - Occupational Stress Questionnaire    Feeling of Stress : Only a little  Social Connections: Not on file  Intimate Partner Violence: Not on file       PHYSICAL EXAM  Vitals:   04/09/23 1029  BP: 124/79  Pulse: 72  Weight: 243 lb 8 oz (110.5 kg)  Height: 5\' 1"  (1.549 m)    Body mass index is 46.01 kg/m.   General: The patient is well-developed and well-nourished and in no acute distress  HEENT:  Head is /AT.  Sclera are anicteric.    Neck: No carotid bruits are noted.  The neck is nontender.   No occipital pain.  Good ROM  Cardiovascular: The heart has a regular rate and rhythm with a normal S1 and S2. There were no murmurs, gallops or rubs.    Skin: Extremities are without rash or  edema.  Musculoskeletal:  Back is nontender  Neurologic Exam  Mental status: The patient is alert and oriented x 3 at the time of the examination. The patient has apparent normal  recent and remote memory, with an apparently normal attention span and  concentration ability.   Speech is normal.  Cranial nerves: Extraocular movements are full.   There is good facial sensation to soft touch bilaterally.Facial strength is normal.  Trapezius and sternocleidomastoid strength is normal. No dysarthria is noted.  The tongue is midline, and the patient has symmetric elevation of the soft palate. No obvious hearing deficits are noted.  Motor:  Muscle bulk is normal.   Tone is normal. Strength is  5 / 5 in all 4 extremities.   Sensory: Sensory testing is intact to pinprick, soft touch and vibration sensation in all 4 extremities.  Coordination: Cerebellar testing reveals good finger-nose-finger and heel-to-shin bilaterally.  Gait and station: Station is normal.   Gait is normal. Tandem gait is mildly wide. Romberg is negative.   Reflexes: Deep tendon reflexes are symmetric and normal bilaterally.        DIAGNOSTIC DATA (LABS, IMAGING, TESTING) - I reviewed patient records, labs, notes, testing and imaging myself where available.  Lab Results  Component Value Date   WBC 7.0 09/10/2022   HGB 13.7 09/10/2022   HCT 41.4 09/10/2022   MCV 93.0 09/10/2022   PLT 218.0 09/10/2022      Component Value Date/Time   NA 141 09/10/2022 1627   NA 141 11/14/2021 0955   K 4.5 09/10/2022 1627   CL 105 09/10/2022 1627   CO2 26 09/10/2022 1627   GLUCOSE 168 (H) 09/10/2022 1627   BUN 18 09/10/2022 1627   BUN 12 11/14/2021 0955   CREATININE 1.03 09/10/2022 1627   CALCIUM 9.0 09/10/2022 1627   PROT 6.5 09/10/2022 1627   PROT 6.8 11/14/2021 0955   ALBUMIN 4.0 09/10/2022 1627   ALBUMIN 4.4 11/14/2021 0955   AST 13 09/10/2022 1627   ALT 14 09/10/2022 1627   ALKPHOS 127 (H) 09/10/2022 1627   BILITOT 1.0 09/10/2022 1627   BILITOT 1.2 11/14/2021 0955   GFRNONAA >60 05/29/2022 1515   GFRAA 105 04/30/2020 1218   Lab Results  Component Value Date   CHOL 135 09/10/2022   HDL 59.10 09/10/2022   LDLCALC 41 09/10/2022   TRIG 174.0 (H) 09/10/2022   CHOLHDL  2 09/10/2022   Lab Results  Component Value Date   HGBA1C 5.5 01/12/2023   HGBA1C 5.5 01/12/2023   HGBA1C 5.5 (A) 01/12/2023   HGBA1C 5.5 01/12/2023   Lab Results  Component Value Date   VITAMINB12 252 09/10/2022   Lab Results  Component Value Date   TSH 1.25 09/10/2022       ASSESSMENT AND PLAN  Acute nonintractable headache, unspecified headache type - Plan: Sedimentation rate, C-reactive protein  Atypical facial pain  History of herpes zoster  Urinary frequency  Insomnia, unspecified type   In summary, Ms. Yore is a 72 year old woman with intermittent severe right frontal headache that last just a few minutes followed by lightheadedness for a few more minutes.  She has a history of zoster ophthalmicus, but this was on the left so is likely unrelated.  Etiology of the headache is uncertain.  She did not have occipital tenderness on examination making occipital neuralgia less likely.   We will check an ESR and CRP to assess for temporal arteritis.   Besides headache, she notes insomnia and nocturia.  I will have her start imipramine 25 mg to see if this helps her headaches, and possibly other symptoms.  We can increase the dose to 50 mg if needed. She will return  in 4 or 5 months or sooner if there are new or worsening neurologic symptoms.  Thank you for asking me to see Ms. Philbert.  Please let me know if I can be of further assistance with her or other patients in the future.   Ilithyia Titzer A. Epimenio Foot, MD, [facility] 04/09/2023, 12:05 PM Certified in Neurology, Clinical Neurophysiology, Sleep Medicine and Neuroimaging  [facility] [address], [address] [place], [place] [address] [phone]

## 2023-04-10 LAB — C-REACTIVE PROTEIN: CRP: <1 mg/L (ref 0–10)

## 2023-04-10 LAB — SEDIMENTATION RATE: Sed Rate: 2 mm/h (ref 0–40)

## 2023-04-13 ENCOUNTER — Other Ambulatory Visit: Payer: Self-pay | Admitting: Nurse Practitioner

## 2023-04-14 ENCOUNTER — Ambulatory Visit: Payer: PPO | Admitting: Nurse Practitioner

## 2023-04-22 ENCOUNTER — Ambulatory Visit (INDEPENDENT_AMBULATORY_CARE_PROVIDER_SITE_OTHER): Payer: PPO | Admitting: Nurse Practitioner

## 2023-04-22 ENCOUNTER — Encounter: Payer: Self-pay | Admitting: Nurse Practitioner

## 2023-04-22 VITALS — BP 120/82 | HR 77 | Temp 98.4°F | Ht 61.0 in | Wt 240.0 lb

## 2023-04-22 DIAGNOSIS — E039 Hypothyroidism, unspecified: Secondary | ICD-10-CM

## 2023-04-22 DIAGNOSIS — M810 Age-related osteoporosis without current pathological fracture: Secondary | ICD-10-CM | POA: Diagnosis not present

## 2023-04-22 DIAGNOSIS — E782 Mixed hyperlipidemia: Secondary | ICD-10-CM | POA: Diagnosis not present

## 2023-04-22 DIAGNOSIS — R519 Headache, unspecified: Secondary | ICD-10-CM

## 2023-04-22 DIAGNOSIS — G8929 Other chronic pain: Secondary | ICD-10-CM

## 2023-04-22 DIAGNOSIS — Z23 Encounter for immunization: Secondary | ICD-10-CM

## 2023-04-22 DIAGNOSIS — R7303 Prediabetes: Secondary | ICD-10-CM | POA: Diagnosis not present

## 2023-04-22 DIAGNOSIS — E559 Vitamin D deficiency, unspecified: Secondary | ICD-10-CM | POA: Diagnosis not present

## 2023-04-22 DIAGNOSIS — Z0000 Encounter for general adult medical examination without abnormal findings: Secondary | ICD-10-CM | POA: Diagnosis not present

## 2023-04-22 DIAGNOSIS — I5032 Chronic diastolic (congestive) heart failure: Secondary | ICD-10-CM | POA: Diagnosis not present

## 2023-04-22 DIAGNOSIS — F419 Anxiety disorder, unspecified: Secondary | ICD-10-CM

## 2023-04-22 DIAGNOSIS — L304 Erythema intertrigo: Secondary | ICD-10-CM | POA: Insufficient documentation

## 2023-04-22 DIAGNOSIS — I25118 Atherosclerotic heart disease of native coronary artery with other forms of angina pectoris: Secondary | ICD-10-CM | POA: Diagnosis not present

## 2023-04-22 DIAGNOSIS — I10 Essential (primary) hypertension: Secondary | ICD-10-CM

## 2023-04-22 LAB — VITAMIN D 25 HYDROXY (VIT D DEFICIENCY, FRACTURES): VITD: 14.39 ng/mL — ABNORMAL LOW (ref 30.00–100.00)

## 2023-04-22 LAB — CBC WITH DIFFERENTIAL/PLATELET
Basophils Absolute: 0 10*3/uL (ref 0.0–0.1)
Basophils Relative: 0.5 % (ref 0.0–3.0)
Eosinophils Absolute: 0.1 10*3/uL (ref 0.0–0.7)
Eosinophils Relative: 1.8 % (ref 0.0–5.0)
HCT: 42 % (ref 36.0–46.0)
Hemoglobin: 13.6 g/dL (ref 12.0–15.0)
Lymphocytes Relative: 29.5 % (ref 12.0–46.0)
Lymphs Abs: 1.6 10*3/uL (ref 0.7–4.0)
MCHC: 32.3 g/dL (ref 30.0–36.0)
MCV: 94.5 fL (ref 78.0–100.0)
Monocytes Absolute: 0.3 10*3/uL (ref 0.1–1.0)
Monocytes Relative: 6.2 % (ref 3.0–12.0)
Neutro Abs: 3.3 10*3/uL (ref 1.4–7.7)
Neutrophils Relative %: 62 % (ref 43.0–77.0)
Platelets: 227 10*3/uL (ref 150.0–400.0)
RBC: 4.44 Mil/uL (ref 3.87–5.11)
RDW: 15.1 % (ref 11.5–15.5)
WBC: 5.3 10*3/uL (ref 4.0–10.5)

## 2023-04-22 LAB — COMPREHENSIVE METABOLIC PANEL
ALT: 14 U/L (ref 0–35)
AST: 13 U/L (ref 0–37)
Albumin: 4.1 g/dL (ref 3.5–5.2)
Alkaline Phosphatase: 105 U/L (ref 39–117)
BUN: 13 mg/dL (ref 6–23)
CO2: 28 meq/L (ref 19–32)
Calcium: 9.1 mg/dL (ref 8.4–10.5)
Chloride: 106 meq/L (ref 96–112)
Creatinine, Ser: 0.68 mg/dL (ref 0.40–1.20)
GFR: 85.81 mL/min (ref >60.00)
Glucose, Bld: 136 mg/dL — ABNORMAL HIGH (ref 70–99)
Potassium: 4.2 meq/L (ref 3.5–5.1)
Sodium: 143 meq/L (ref 135–145)
Total Bilirubin: 1.3 mg/dL — ABNORMAL HIGH (ref 0.2–1.2)
Total Protein: 6.6 g/dL (ref 6.0–8.3)

## 2023-04-22 LAB — LIPID PANEL
Cholesterol: 129 mg/dL (ref 0–200)
HDL: 61 mg/dL (ref >39.00)
LDL Cholesterol: 53 mg/dL (ref 0–99)
NonHDL: 68.24
Total CHOL/HDL Ratio: 2
Triglycerides: 77 mg/dL (ref 0.0–149.0)
VLDL: 15.4 mg/dL (ref 0.0–40.0)

## 2023-04-22 LAB — HEMOGLOBIN A1C: Hgb A1c MFr Bld: 5.8 % (ref 4.6–6.5)

## 2023-04-22 LAB — THYROID PANEL WITH TSH
Free Thyroxine Index: 2 (ref 1.4–3.8)
T3 Uptake: 24 % (ref 22–35)
T4, Total: 8.4 ug/dL (ref 5.1–11.9)
TSH: 1.47 m[IU]/L (ref 0.40–4.50)

## 2023-04-22 MED ORDER — KETOCONAZOLE 2 % EX CREA: 1.0000 | TOPICAL_CREAM | Freq: Every day | CUTANEOUS | 0 refills | Status: AC

## 2023-04-22 MED ORDER — VITAMIN D (ERGOCALCIFEROL) 1.25 MG (50000 UNIT) PO CAPS: 50000.0000 [IU] | ORAL_CAPSULE | ORAL | 0 refills | Status: DC

## 2023-04-22 NOTE — Progress Notes (Signed)
BP 120/82 (BP Location: Right Arm)   Pulse 77   Temp 98.4 F (36.9 C)   Ht 5\' 1"  (1.549 m)   Wt 240 lb (108.9 kg)   SpO2 94%   BMI 45.35 kg/m    Subjective:    Patient ID: [name], female    DOB: May 17, 1951, 72 y.o.   MRN: [id number]  CC: Chief Complaint  Patient presents with   Annual Exam    HPI: [name] is a 72 y.o. female presenting on 04/22/2023 for comprehensive medical examination. Current medical complaints include: itching, rash under breasts  She notes that itching, and redness under her breasts.  She states that it gets worse when the weather is warm out and she is sweating.  She has tried to keep the area clean and dry, however is not helping.  She is wondering if there is anything else she could try to help with the symptoms.  She also notes that she had to stop the Ozempic, about a few weeks ago.  She has reached the donut hole and is unable to afford the medication at this time.  She currently lives with: alone Menopausal Symptoms: no  Depression and Anxiety Screen done today and results listed below:     11/24/2022    4:00 PM 11/06/2022    1:44 PM 04/11/2022    1:59 PM  Depression screen PHQ 2/9  Decreased Interest 0 0 0  Down, Depressed, Hopeless 0 0 0  PHQ - 2 Score 0 0 0      04/12/2022   12:22 PM  GAD 7 : Generalized Anxiety Score  Nervous, Anxious, on Edge 0  Control/stop worrying 0  Worry too much - different things 0  Trouble relaxing 0  Restless 0  Easily annoyed or irritable 0  Afraid - awful might happen 0  Total GAD 7 Score 0  Anxiety Difficulty Not difficult at all    The patient does not have a history of falls. I did not complete a risk assessment for falls. A plan of care for falls was not documented.   Past Medical History:  Past Medical History:  Diagnosis Date   Anosmia 10/30/2015   Atypical facial pain 10/30/2015   Left upper face   Chronic pain syndrome 07/09/2017   Coronary artery disease     Depression    Dyspnea on exertion 09/20/2015   Gastro-esophageal reflux    GERD (gastroesophageal reflux disease) 05/25/2012   Hyperlipidemia    Hypertension    Hypothyroidism 05/25/2012   Last Assessment & Plan:  States was started after a partial thyroidectomy but does not recall any abnormal lab value and no symptoms.  Discussed as is on low dose, would be reasonable to discontinue and recheck in 6-8 weeks which she would like to do. Last Assessment & Plan:  States was started after a partial thyroidectomy but does not recall any abnormal lab value and no symptoms.  Discussed as    Lumbar spondylosis 07/09/2017   Memory disorder 10/30/2015   Obesity, morbid, BMI 40.0-49.9 (HCC) 06/02/2014   Osteoporosis 05/02/2013   Overview:  DEXA 11/14 DEXA 11/14   Prediabetes    Restless legs syndrome 05/25/2012   Last Assessment & Plan:  patient reports using clonazepam about once weekly .  Discussed nonpharmacologic tips for treating RLS.  States other medicatiosn she had treid had not been effective (does not remember name) Will refill for now.  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   URI, acute 09/20/2015   Vitamin B 12 deficiency    Vitamin D deficiency 05/25/2012    Surgical History:  Past Surgical History:  Procedure Laterality Date   ABDOMINAL HYSTERECTOMY     APPENDECTOMY     CARDIAC CATHETERIZATION  12/23/2019   CATARACT EXTRACTION     CHOLECYSTECTOMY     CORONARY STENT INTERVENTION N/A 12/23/2019   Procedure: CORONARY STENT INTERVENTION;  Surgeon: Lyn Records, MD;  Location: [facility];  Service: Cardiovascular;  Laterality: N/A;   JOINT REPLACEMENT     2 total knee replacement   LASIK     LEFT HEART CATH AND CORONARY ANGIOGRAPHY N/A 12/23/2019   Procedure: LEFT HEART CATH AND CORONARY ANGIOGRAPHY;  Surgeon: Lyn Records, MD;  Location: [facility];   Service: Cardiovascular;  Laterality: N/A;   REVERSE SHOULDER ARTHROPLASTY Right 04/03/2020   Procedure: REVERSE SHOULDER ARTHROPLASTY;  Surgeon: Yolonda Kida, MD;  Location: [facility];  Service: Orthopedics;  Laterality: Right;  2.5 hrs   SHOULDER ARTHROSCOPY WITH ROTATOR CUFF REPAIR AND SUBACROMIAL DECOMPRESSION Left 10/25/2020   Procedure: Left shoulder arthroscopic, extensive debridement, subacromial decompression;  Surgeon: Yolonda Kida, MD;  Location: [facility];  Service: Orthopedics;  Laterality: Left;  90 mins   THYROIDECTOMY     TONSILLECTOMY      Medications:  Current Outpatient Medications on File Prior to Visit  Medication Sig   aspirin 81 MG EC tablet Take 81 mg by mouth daily.   atorvastatin (LIPITOR) 80 MG tablet TAKE 1 TABLET(80 MG) BY MOUTH DAILY   HYDROcodone-acetaminophen (NORCO) 10-325 MG tablet Take 1 tablet by mouth 4 (four) times daily as needed.   losartan (COZAAR) 50 MG tablet Take 1 tablet (50 mg total) by mouth daily.   metoprolol succinate (TOPROL-XL) 50 MG 24 hr tablet TAKE 1 TABLET(50 MG) BY MOUTH DAILY   Multiple Vitamins-Minerals (CENTRUM SILVER 50+WOMEN) TABS Take 1 tablet by mouth daily.   ondansetron (ZOFRAN ODT) 4 MG disintegrating tablet Take 1 tablet (4 mg total) by mouth every 8 (eight) hours as needed.   pantoprazole (PROTONIX) 40 MG tablet Take 1 tablet (40 mg total) by mouth daily.   nitroGLYCERIN (NITROSTAT) 0.4 MG SL tablet Place 1 tablet (0.4 mg total) under the tongue every 5 (five) minutes as needed for chest pain. (Patient not taking: Reported on 04/22/2023)   No current facility-administered medications on file prior to visit.    Allergies:  No Known Allergies  Social History:  Social History   Socioeconomic History   Marital status: Divorced    Spouse name: Not on file   Number of children: 1   Years of education: Not on file   Highest education level: Not on file  Occupational History   Not on file  Tobacco Use    Smoking status: Former    Current packs/day: 0.00    Types: Cigarettes    Quit date: 02/17/1987    Years since quitting: 36.2   Smokeless tobacco: Never  Vaping Use   Vaping status: Never Used  Substance and Sexual Activity   Alcohol use: No   Drug use: No   Sexual activity: Not Currently    Birth control/protection: Post-menopausal, Surgical    Comment: Hysterectomy  Other Topics Concern   Not on file  Social History Narrative  Social Determinants of Health   Financial Resource Strain: Low Risk  (11/24/2022)   Overall Financial Resource Strain (CARDIA)    Difficulty of Paying Living Expenses: Not hard at all  Food Insecurity: No Food Insecurity (11/24/2022)   Hunger Vital Sign    Worried About Running Out of Food in the Last Year: Never true    Ran Out of Food in the Last Year: Never true  Transportation Needs: No Transportation Needs (11/24/2022)   PRAPARE - Administrator, Civil Service (Medical): No    Lack of Transportation (Non-Medical): No  Physical Activity: Inactive (11/24/2022)   Exercise Vital Sign    Days of Exercise per Week: 0 days    Minutes of Exercise per Session: 0 min  Stress: No Stress Concern Present (11/24/2022)   [employer] of Occupational Health - Occupational Stress Questionnaire    Feeling of Stress : Only a little  Social Connections: Not on file  Intimate Partner Violence: Not on file   Social History   Tobacco Use  Smoking Status Former   Current packs/day: 0.00   Types: Cigarettes   Quit date: 02/17/1987   Years since quitting: 36.2  Smokeless Tobacco Never   Social History   Substance and Sexual Activity  Alcohol Use No    Family History:  Family History  Problem Relation Age of Onset   Diabetes Mother    Cancer Mother        lung   Hypertension Mother    Cancer Father        lung   Diabetes Sister    Heart disease Brother    Diabetes Brother    Diabetes Brother    Breast cancer Neg Hx     Past medical  history, surgical history, medications, allergies, family history and social history reviewed with patient today and changes made to appropriate areas of the chart.   Review of Systems  Constitutional: Negative.   HENT: Negative.    Eyes: Negative.   Respiratory: Negative.    Cardiovascular: Negative.   Gastrointestinal: Negative.   Genitourinary: Negative.   Musculoskeletal: Negative.   Skin:  Positive for itching (under breasts) and rash.  Neurological:  Positive for dizziness (intermittent, with position changes) and headaches (intermittent).  Psychiatric/Behavioral: Negative.     All other ROS negative except what is listed above and in the HPI.      Objective:    BP 120/82 (BP Location: Right Arm)   Pulse 77   Temp 98.4 F (36.9 C)   Ht 5\' 1"  (1.549 m)   Wt 240 lb (108.9 kg)   SpO2 94%   BMI 45.35 kg/m   Wt Readings from Last 3 Encounters:  04/22/23 240 lb (108.9 kg)  04/09/23 243 lb 8 oz (110.5 kg)  01/12/23 250 lb (113.4 kg)    Physical Exam Vitals and nursing note reviewed.  Constitutional:      General: She is not in acute distress.    Appearance: Normal appearance. She is obese.  HENT:     Head: Normocephalic and atraumatic.     Right Ear: Tympanic membrane, ear canal and external ear normal.     Left Ear: Tympanic membrane, ear canal and external ear normal.  Eyes:     Conjunctiva/sclera: Conjunctivae normal.  Cardiovascular:     Rate and Rhythm: Normal rate and regular rhythm.     Pulses: Normal pulses.     Heart sounds: Normal heart sounds.  Pulmonary:  Effort: Pulmonary effort is normal.     Breath sounds: Normal breath sounds.  Abdominal:     Palpations: Abdomen is soft.     Tenderness: There is no abdominal tenderness.  Musculoskeletal:        General: Normal range of motion.     Cervical back: Normal range of motion and neck supple.     Right lower leg: No edema.     Left lower leg: No edema.  Lymphadenopathy:     Cervical: No cervical  adenopathy.  Skin:    General: Skin is warm and dry.  Neurological:     General: No focal deficit present.     Mental Status: She is alert and oriented to person, place, and time.     Cranial Nerves: No cranial nerve deficit.     Coordination: Coordination normal.     Gait: Gait normal.  Psychiatric:        Mood and Affect: Mood normal.        Behavior: Behavior normal.        Thought Content: Thought content normal.        Judgment: Judgment normal.     Results for orders placed or performed in visit on 04/09/23  Sedimentation rate  Result Value Ref Range   Sed Rate 2 0 - 40 mm/hr  C-reactive protein  Result Value Ref Range   CRP <1 0 - 10 mg/L      Assessment & Plan:   Problem List Items Addressed This Visit       Cardiovascular and Mediastinum   Essential hypertension    Chronic, stable.  Continue metoprolol 50 mg daily and losartan 50 mg daily.  Check CMP, CBC today.  Follow-up in 6 months.      Relevant Orders   CBC with Differential/Platelet   Comprehensive metabolic panel   Chronic diastolic CHF (congestive heart failure) (HCC)    Chronic, stable.  Euvolemic on exam today.  Continue losartan 50 mg daily and Toprol-XL 50 mg daily.  Most recent echocardiogram showed an EF of 65 to 70%.      Coronary artery disease of native artery of native heart with stable angina pectoris (HCC)    Chronic, stable.  She is currently chest pain-free.  Will have her continue atorvastatin 80 mg daily, aspirin 81 mg daily, Toprol-XL 50 mg daily, and losartan 50 mg daily.  She is currently in the donut hole with her insurance and unable to afford ozempic. Will refill once she is able to afford this again. Check CMP, CBC, lipid panel today. Follow-up in 3 months      Relevant Orders   CBC with Differential/Platelet   Comprehensive metabolic panel   Lipid panel     Endocrine   Hypothyroidism    Check TSH today. She had a partial thyroidectomy and is not currently taking any  medication.       Relevant Orders   Thyroid Panel With TSH     Musculoskeletal and Integument   Osteoporosis    DEXA scan in 2014 showed a T score of -3.3.  She is not currently interested in repeating this or starting a medication.  Continue taking calcium and vitamin d supplement. Check vitamin D today.       Intertrigo    Chronic, ongoing. Keep area clean and dry. Start using ketoconazole cream daily to affected areas.         Other   Hyperlipidemia    Chronic, stable.  Continue  atorvastatin 80 mg daily.  Check CMP, CBC, lipid panel today.      Relevant Orders   CBC with Differential/Platelet   Comprehensive metabolic panel   Lipid panel   Morbid obesity (HCC)    She has lost another 7 pounds since her last visit.  She is currently in the donut hole and unable to afford the Ozempic injection.  Will restart this once her insurance starts out where she gets out of the donut hole.      Vitamin D deficiency    Chronic, stable.  He taking daily vitamin D supplement.  Check vitamin D levels today and adjust regimen based on results.      Relevant Orders   VITAMIN D 25 Hydroxy (Vit-D Deficiency, Fractures)   Prediabetes    Chronic, stable.  She was taking Ozempic until a few weeks ago.  She is unable to afford this because she is now in the donut hole.  Will have her restart this after her insurance starts next year where she gets out of the donut hole whichever comes first.  Continue focus on nutrition and exercise.  Check A1c today.      Relevant Orders   Hemoglobin A1c   Anxiety    Chronic, stable.  Continue BuSpar 5 mg daily as needed.      Chronic intractable headache    Chronic, ongoing.  She still having random, intermittent headaches associated with dizziness.  MRI was negative.  She went to neurology and was told that it is most likely due to age.  Sed rate and CRP were negative.  Continue atorvastatin 80 mg daily.      Routine general medical examination at a  health care facility - Primary    Health maintenance reviewed and updated. Discussed nutrition, exercise. Check CMP, CBC today. Follow-up 1 year.        Other Visit Diagnoses     Immunization due       Flu vaccine given today.   Relevant Orders   Flu Vaccine Trivalent High Dose (Fluad)        Follow up plan: Return in about 3 months (around 07/23/2023) for prediabetes.   LABORATORY TESTING:  - Pap smear: not applicable  IMMUNIZATIONS:   - Tdap: Tetanus vaccination status reviewed: last tetanus booster within 10 years. - Influenza: Administered today - Pneumovax: Up to date - Prevnar: Up to date - HPV: Not applicable - Shingrix vaccine: Up to date  SCREENING: -Mammogram: Up to date  - Colonoscopy: Up to date  - Bone Density: Up to date   PATIENT COUNSELING:   Advised to take 1 mg of folate supplement per day if capable of pregnancy.   Sexuality: Discussed sexually transmitted diseases, partner selection, use of condoms, avoidance of unintended pregnancy  and contraceptive alternatives.   Advised to avoid cigarette smoking.  I discussed with the patient that most people either abstain from alcohol or drink within safe limits (<=14/week and <=4 drinks/occasion for males, <=7/weeks and <= 3 drinks/occasion for females) and that the risk for alcohol disorders and other health effects rises proportionally with the number of drinks per week and how often a drinker exceeds daily limits.  Discussed cessation/primary prevention of drug use and availability of treatment for abuse.   Diet: Encouraged to adjust caloric intake to maintain  or achieve ideal body weight, to reduce intake of dietary saturated fat and total fat, to limit sodium intake by avoiding high sodium foods and not adding table salt,  and to maintain adequate dietary potassium and calcium preferably from fresh fruits, vegetables, and low-fat dairy products.    stressed the importance of regular exercise  Injury  prevention: Discussed safety belts, safety helmets, smoke detector, smoking near bedding or upholstery.   Dental health: Discussed importance of regular tooth brushing, flossing, and dental visits.    NEXT PREVENTATIVE PHYSICAL DUE IN 1 YEAR. Return in about 3 months (around 07/23/2023) for prediabetes.  Larua Collier A Daxon Kyne

## 2023-04-22 NOTE — Assessment & Plan Note (Signed)
Chronic, stable.  Euvolemic on exam today.  Continue losartan 50 mg daily and Toprol-XL 50 mg daily.  Most recent echocardiogram showed an EF of 65 to 70%.

## 2023-04-22 NOTE — Assessment & Plan Note (Signed)
Chronic, stable.  Continue metoprolol 50 mg daily and losartan 50 mg daily.  Check CMP, CBC today.  Follow-up in 6 months.

## 2023-04-22 NOTE — Assessment & Plan Note (Signed)
Chronic, stable.  He taking daily vitamin D supplement.  Check vitamin D levels today and adjust regimen based on results.

## 2023-04-22 NOTE — Assessment & Plan Note (Signed)
Chronic, stable.  She is currently chest pain-free.  Will have her continue atorvastatin 80 mg daily, aspirin 81 mg daily, Toprol-XL 50 mg daily, and losartan 50 mg daily.  She is currently in the donut hole with her insurance and unable to afford ozempic. Will refill once she is able to afford this again. Check CMP, CBC, lipid panel today. Follow-up in 3 months

## 2023-04-22 NOTE — Assessment & Plan Note (Signed)
Chronic, ongoing.  She still having random, intermittent headaches associated with dizziness.  MRI was negative.  She went to neurology and was told that it is most likely due to age.  Sed rate and CRP were negative.  Continue atorvastatin 80 mg daily.

## 2023-04-22 NOTE — Assessment & Plan Note (Signed)
Health maintenance reviewed and updated. Discussed nutrition, exercise. Check CMP, CBC today. Follow-up 1 year.   

## 2023-04-22 NOTE — Assessment & Plan Note (Signed)
Chronic, stable.  Continue BuSpar 5 mg daily as needed.

## 2023-04-22 NOTE — Patient Instructions (Signed)
It was great to see you!  We are checking your labs today and will let you know the results via mychart/phone.   Let me know when you need a refill on your ozempic.   Let's follow-up in 6 months, sooner if you have concerns.  If a referral was placed today, you will be contacted for an appointment. Please note that routine referrals can sometimes take up to 3-4 weeks to process. Please call our office if you haven't heard anything after this time frame.  Take care,  Rodman Pickle, NP

## 2023-04-22 NOTE — Addendum Note (Signed)
Addended by: Rodman Pickle A on: 04/22/2023 04:52 PM   Modules accepted: Orders

## 2023-04-22 NOTE — Assessment & Plan Note (Signed)
Check TSH today. She had a partial thyroidectomy and is not currently taking any medication.

## 2023-04-22 NOTE — Assessment & Plan Note (Signed)
DEXA scan in 2014 showed a T score of -3.3.  She is not currently interested in repeating this or starting a medication.  Continue taking calcium and vitamin d supplement. Check vitamin D today.

## 2023-04-22 NOTE — Assessment & Plan Note (Signed)
Chronic, stable.  Continue atorvastatin 80 mg daily.  Check CMP, CBC, lipid panel today.

## 2023-04-22 NOTE — Assessment & Plan Note (Signed)
Chronic, stable.  She was taking Ozempic until a few weeks ago.  She is unable to afford this because she is now in the donut hole.  Will have her restart this after her insurance starts next year where she gets out of the donut hole whichever comes first.  Continue focus on nutrition and exercise.  Check A1c today.

## 2023-04-22 NOTE — Assessment & Plan Note (Signed)
Chronic, ongoing. Keep area clean and dry. Start using ketoconazole cream daily to affected areas.

## 2023-04-22 NOTE — Assessment & Plan Note (Signed)
She has lost another 7 pounds since her last visit.  She is currently in the donut hole and unable to afford the Ozempic injection.  Will restart this once her insurance starts out where she gets out of the donut hole.

## 2023-04-24 ENCOUNTER — Telehealth: Payer: Self-pay | Admitting: Nurse Practitioner

## 2023-04-24 DIAGNOSIS — R7303 Prediabetes: Secondary | ICD-10-CM

## 2023-04-24 NOTE — Telephone Encounter (Signed)
Pt would like to change her Ozempic to [facility] due to price.

## 2023-04-24 NOTE — Telephone Encounter (Signed)
I spoke with patient and she said that she called her insurance company and wants a 30 day supply sent to pharmacy of [facility]

## 2023-04-26 ENCOUNTER — Encounter: Payer: Self-pay | Admitting: Nurse Practitioner

## 2023-04-28 MED ORDER — TIRZEPATIDE 2.5 MG/0.5ML ~~LOC~~ SOAJ: 2.5000 mg | SUBCUTANEOUS | 1 refills | Status: DC

## 2023-04-28 NOTE — Addendum Note (Signed)
Addended by: Rodman Pickle A on: 04/28/2023 04:19 PM   Modules accepted: Orders

## 2023-04-28 NOTE — Addendum Note (Signed)
Addended by: Rodman Pickle A on: 04/28/2023 03:45 PM   Modules accepted: Orders

## 2023-04-28 NOTE — Telephone Encounter (Signed)
See other mychart message.

## 2023-04-28 NOTE — Telephone Encounter (Signed)
I called patient and she said that [facility] was $1200.00. So she called the pharmacy to get a refill of the Ozempic.

## 2023-04-28 NOTE — Telephone Encounter (Signed)
Noted  

## 2023-04-29 DIAGNOSIS — H43813 Vitreous degeneration, bilateral: Secondary | ICD-10-CM | POA: Diagnosis not present

## 2023-05-02 ENCOUNTER — Other Ambulatory Visit: Payer: Self-pay | Admitting: Nurse Practitioner

## 2023-05-04 ENCOUNTER — Other Ambulatory Visit: Payer: Self-pay | Admitting: Nurse Practitioner

## 2023-05-04 NOTE — Telephone Encounter (Signed)
Requesting: ATORVASTATIN 80MG  TABLETS  Last Visit: 04/22/2023 Next Visit: 07/23/2023 Last Refill: 04/11/2022  Please Advise

## 2023-05-04 NOTE — Telephone Encounter (Signed)
Requesting: PANTOPRAZOLE 40MG  TABLETS, LOSARTAN 50MG  TABLETS  Last Visit: 04/22/2023 Next Visit: 07/23/2023 Last Refill: 04/11/2022  Please Advise

## 2023-05-15 ENCOUNTER — Other Ambulatory Visit: Payer: Self-pay | Admitting: Nurse Practitioner

## 2023-05-15 ENCOUNTER — Ambulatory Visit (INDEPENDENT_AMBULATORY_CARE_PROVIDER_SITE_OTHER): Payer: PPO | Admitting: Nurse Practitioner

## 2023-05-15 ENCOUNTER — Encounter: Payer: Self-pay | Admitting: Nurse Practitioner

## 2023-05-15 VITALS — BP 119/68 | HR 72 | Temp 98.5°F | Resp 18 | Wt 237.2 lb

## 2023-05-15 DIAGNOSIS — J069 Acute upper respiratory infection, unspecified: Secondary | ICD-10-CM

## 2023-05-15 LAB — POC COVID19 BINAXNOW: SARS Coronavirus 2 Ag: NEGATIVE

## 2023-05-15 LAB — POCT INFLUENZA A/B
Influenza A, POC: NEGATIVE
Influenza B, POC: NEGATIVE

## 2023-05-15 NOTE — Patient Instructions (Addendum)
URI Instructions: Encourage adequate oral hydration. Flonase or Astepro Nasal spray or coricidin for nasal/sinus congestion. Avoid decongestants if you have high blood pressure. Use mucinex DM or Robitussin  or delsym for cough without congestion  You can use plain "Tylenol" or "Advil" for fever, chills and achyness. Use cool mist humidifier at bedtime to help with nasal congestion and cough.  Cold/cough medications may have tylenol or ibuprofen or guaifenesin or dextromethophan in them, so be careful not to take beyond the recommended dose for each of these medications.   "Common cold" symptoms are usually triggered by a virus.  The antibiotics are usually not necessary. On average, a" viral cold" illness may take 7-10 days to resolve. Please, make an appointment if you are not better or if you're worse.

## 2023-05-15 NOTE — Progress Notes (Signed)
Established Patient Visit  Patient: [name]   DOB: 1950/12/17   72 y.o. Female  MRN: [id number] Visit Date: 05/15/2023  Subjective:    Chief Complaint  Patient presents with   Acute Visit    PT C/O of sore throat, cough, headache with hoarse voice since last night. PT used OTC night and dayquil for symptoms    HPI No problem-specific Assessment & Plan notes found for this encounter.  Reviewed medical, surgical, and social history today  Medications: Outpatient Medications Prior to Visit  Medication Sig   aspirin 81 MG EC tablet Take 81 mg by mouth daily.   atorvastatin (LIPITOR) 80 MG tablet TAKE 1 TABLET(80 MG) BY MOUTH DAILY   HYDROcodone-acetaminophen (NORCO) 10-325 MG tablet Take 1 tablet by mouth 4 (four) times daily as needed.   ketoconazole (NIZORAL) 2 % cream Apply 1 Application topically daily.   losartan (COZAAR) 50 MG tablet TAKE 1 TABLET(50 MG) BY MOUTH DAILY   metoprolol succinate (TOPROL-XL) 50 MG 24 hr tablet TAKE 1 TABLET(50 MG) BY MOUTH DAILY   Multiple Vitamins-Minerals (CENTRUM SILVER 50+WOMEN) TABS Take 1 tablet by mouth daily.   pantoprazole (PROTONIX) 40 MG tablet TAKE 1 TABLET(40 MG) BY MOUTH DAILY   Vitamin D, Ergocalciferol, (DRISDOL) 1.25 MG (50000 UNIT) CAPS capsule Take 1 capsule (50,000 Units total) by mouth every 7 (seven) days.   [DISCONTINUED] OZEMPIC, 0.25 OR 0.5 MG/DOSE, 2 MG/3ML SOPN Inject 0.5 mg into the skin once a week.   nitroGLYCERIN (NITROSTAT) 0.4 MG SL tablet Place 1 tablet (0.4 mg total) under the tongue every 5 (five) minutes as needed for chest pain. (Patient not taking: Reported on 04/22/2023)   ondansetron (ZOFRAN ODT) 4 MG disintegrating tablet Take 1 tablet (4 mg total) by mouth every 8 (eight) hours as needed. (Patient not taking: Reported on 05/15/2023)   No facility-administered medications prior to visit.   Reviewed past medical and social history.   ROS per HPI above      Objective:  BP 119/68  (BP Location: Right Arm, Patient Position: Sitting, Cuff Size: Large)   Pulse 72   Temp 98.5 F (36.9 C) (Oral)   Resp 18   Wt 237 lb 3.2 oz (107.6 kg)   SpO2 99%   BMI 44.82 kg/m      Physical Exam Vitals and nursing note reviewed.  Constitutional:      General: She is not in acute distress. HENT:     Right Ear: Tympanic membrane, ear canal and external ear normal. There is no impacted cerumen.     Left Ear: Tympanic membrane, ear canal and external ear normal. There is no impacted cerumen.     Nose: Congestion and rhinorrhea present. No nasal tenderness or mucosal edema.     Right Nostril: No occlusion.     Left Nostril: No occlusion.     Right Turbinates: Not enlarged, swollen or pale.     Left Turbinates: Not enlarged, swollen or pale.     Right Sinus: No maxillary sinus tenderness or frontal sinus tenderness.     Left Sinus: No maxillary sinus tenderness or frontal sinus tenderness.     Mouth/Throat:     Pharynx: Oropharynx is clear. Uvula midline. Posterior oropharyngeal erythema and postnasal drip present. No pharyngeal swelling, oropharyngeal exudate or uvula swelling.     Tonsils: No tonsillar exudate or tonsillar abscesses.  Eyes:     Extraocular Movements:  Extraocular movements intact.     Conjunctiva/sclera: Conjunctivae normal.  Cardiovascular:     Rate and Rhythm: Normal rate and regular rhythm.     Pulses: Normal pulses.     Heart sounds: Normal heart sounds.  Pulmonary:     Effort: Pulmonary effort is normal.     Breath sounds: Normal breath sounds.  Musculoskeletal:     Cervical back: Normal range of motion and neck supple.  Lymphadenopathy:     Cervical: No cervical adenopathy.  Neurological:     Mental Status: She is alert and oriented to person, place, and time.     Results for orders placed or performed in visit on 05/15/23  POC COVID-19  Result Value Ref Range   SARS Coronavirus 2 Ag Negative Negative  POCT Influenza A/B  Result Value Ref  Range   Influenza A, POC Negative Negative   Influenza B, POC Negative Negative      Assessment & Plan:    Problem List Items Addressed This Visit   None Visit Diagnoses     Viral upper respiratory tract infection    -  Primary   Relevant Orders   POC COVID-19 (Completed)   POCT Influenza A/B (Completed)     Encourage adequate oral hydration. Flonase or Astepro Nasal spray or coricidin for nasal/sinus congestion. Avoid decongestants if you have high blood pressure. Use mucinex DM or Robitussin  or delsym for cough without congestion  You can use plain "Tylenol" or "Advil" for fever, chills and achyness. Use cool mist humidifier at bedtime to help with nasal congestion and cough.  No follow-ups on file.     Alysia Penna, NP

## 2023-05-18 ENCOUNTER — Telehealth: Payer: Self-pay | Admitting: Nurse Practitioner

## 2023-05-18 NOTE — Telephone Encounter (Signed)
Spoke to patient regarding concerns and she verbalized understanding. PT will call clinic or make follow up appointment if things become worse. She is able to drink and talk but has no appetite eating very little no SOB. I did advise patient to rest, drink plenty of fluids and to take OTC medications as directed.

## 2023-05-18 NOTE — Telephone Encounter (Signed)
Caller Name: Eunice Blase, pt Call back phone #: [phone]  Reason for Call: Pt called in requesting an antibiotic (z-pack). Pt states she still has a sore throat and cough. Fever broke. Pt feels weak.   Pt saw [facility] 05/15/2023  [id number] [name], [place] - [address] AT [facility] [address] Phone: [phone]  Fax: [phone]

## 2023-05-20 NOTE — Progress Notes (Unsigned)
Cardiology Office Note:    Date:  05/20/2023   ID:  [name], DOB 04-Jan-1951, MRN [id number]  PCP:  Gerre Scull, NP   [facility] Providers Cardiologist:  Lesleigh Noe, MD (Inactive)     Referring MD: Gerre Scull, NP   No chief complaint on file.   History of Present Illness:    [name] is a 72 y.o. female seen to establish cardiac care for CAD. Formerly seen by Dr Verdis Prime. She has a history of hypertension, obesity, hyperlipidemia, DM II, July 2021 unstable angina treated with DES of the first diagonal with good result.  Intolerant of Brilinta due to dyspnea.   She states that 3 weeks ago she had a funny feeling in her chest with some pressure. Lasted about an hour. Has been under a lot of stress with her daughter's social situation. No chest pain since then. Does not have Ntg to take. She is sedentary. Has just [facility] [employer] to do [employer]. Has back issues and gets periodic injections by Dr Ethelene Hal.     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  Discussed as is on low dose, would be reasonable to discontinue and recheck in 6-8 weeks which she would like to do. Last Assessment & Plan:  States was started after a partial thyroidectomy but does not recall any abnormal lab value and no symptoms.  Discussed as    Lumbar spondylosis 07/09/2017   Memory disorder 10/30/2015   Obesity, morbid, BMI 40.0-49.9 (HCC) 06/02/2014   Osteoporosis 05/02/2013   Overview:  DEXA 11/14 DEXA  11/14   Prediabetes    Restless legs syndrome 05/25/2012   Last Assessment & Plan:  patient reports using clonazepam about once weekly .  Discussed nonpharmacologic tips for treating RLS.  States other medicatiosn she had treid had not been effective (does not remember name) Will refill for now.  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   URI, acute 09/20/2015   Vitamin B 12 deficiency    Vitamin D deficiency 05/25/2012    Past Surgical History:  Procedure Laterality Date   ABDOMINAL HYSTERECTOMY     APPENDECTOMY     CARDIAC CATHETERIZATION  12/23/2019   CATARACT EXTRACTION     CHOLECYSTECTOMY     CORONARY STENT INTERVENTION N/A 12/23/2019   Procedure: CORONARY STENT INTERVENTION;  Surgeon: Lyn Records, MD;  Location: [facility];  Service: Cardiovascular;  Laterality: N/A;   JOINT REPLACEMENT     2 total knee replacement   LASIK     LEFT HEART CATH AND CORONARY ANGIOGRAPHY N/A 12/23/2019   Procedure: LEFT HEART CATH AND CORONARY ANGIOGRAPHY;  Surgeon: Lyn Records, MD;  Location: [facility];  Service: Cardiovascular;  Laterality: N/A;   REVERSE SHOULDER ARTHROPLASTY Right 04/03/2020   Procedure: REVERSE SHOULDER ARTHROPLASTY;  Surgeon: Yolonda Kida, MD;  Location: [facility];  Service: Orthopedics;  Laterality: Right;  2.5 hrs   SHOULDER ARTHROSCOPY WITH ROTATOR CUFF REPAIR AND SUBACROMIAL DECOMPRESSION Left 10/25/2020   Procedure: Left shoulder arthroscopic, extensive debridement, subacromial decompression;  Surgeon: Yolonda Kida, MD;  Location: [facility] OR;  Service: Orthopedics;  Laterality: Left;  90 mins   THYROIDECTOMY     TONSILLECTOMY      Current Medications: No outpatient medications have been marked as taking for the 05/26/23 encounter (Appointment) with [place], Tiquan Bouch M, MD.     Allergies:   Patient has no known allergies.   Social History    Socioeconomic History   Marital status: Divorced    Spouse name: Not on file   Number of children: 1   Years of education: Not on file   Highest education level: Not on file  Occupational History   Not on file  Tobacco Use   Smoking status: Former    Current packs/day: 0.00    Types: Cigarettes    Quit date: 02/17/1987    Years since quitting: 36.2   Smokeless tobacco: Never  Vaping Use   Vaping status: Never Used  Substance and Sexual Activity   Alcohol use: No   Drug use: No   Sexual activity: Not Currently    Birth control/protection: Post-menopausal, Surgical    Comment: Hysterectomy  Other Topics Concern   Not on file  Social History Narrative      Social Determinants of Health   Financial Resource Strain: Low Risk  (11/24/2022)   Overall Financial Resource Strain (CARDIA)    Difficulty of Paying Living Expenses: Not hard at all  Food Insecurity: No Food Insecurity (11/24/2022)   Hunger Vital Sign    Worried About Running Out of Food in the Last Year: Never true    Ran Out of Food in the Last Year: Never true  Transportation Needs: No Transportation Needs (11/24/2022)   PRAPARE - Administrator, Civil Service (Medical): No    Lack of Transportation (Non-Medical): No  Physical Activity: Inactive (11/24/2022)   Exercise Vital Sign    Days of Exercise per Week: 0 days    Minutes of Exercise per Session: 0 min  Stress: No Stress Concern Present (11/24/2022)   [employer] of Occupational Health - Occupational Stress Questionnaire    Feeling of Stress : Only a little  Social Connections: Not on file     Family History: The patient's family history includes Cancer in her father and mother; Diabetes in her brother, brother, mother, and sister; Heart disease in her brother; Hypertension in her mother. There is no history of Breast cancer.  ROS:   Please see the history of present illness.     All other systems reviewed and are negative.  EKGs/Labs/Other  Studies Reviewed:    Cardiac cath 12/23/19:  CORONARY STENT INTERVENTION  LEFT HEART CATH AND CORONARY ANGIOGRAPHY   Conclusion  2-week history of progressive new onset angina. 99% large first diagonal treated with 2.75 x 15 Onyx postdilated to 3.0 mm and 0% stenosis. Left main is widely patent LAD contains mid eccentric 50% stenosis. Circumflex gives 1 large tortuous obtuse marginal and is free of disease. Dominant with luminal irregularities in the mid body.  No significant obstruction. Very mild mid anterior wall hypokinesis.  EF 50 to 55% which is mildly reduced for female.  LVEDP is 19 mmHg.   RECOMMENDATIONS:   Dual antiplatelet therapy for at least 6  months.  Initially aspirin and Brilinta.  After 4 to 6 weeks Brilinta could be decreased in intensity to clopidogrel plus aspirin. High intensity statin therapy added. Low-dose beta-blocker therapy added. Overnight observation since we have both radial and femoral access during the procedure.re reviewed today:   Echo 06/11/21:  IMPRESSIONS     1. Left ventricular ejection fraction, by estimation, is 65 to 70%. The  left ventricle has normal function. The left ventricle has no regional  wall motion abnormalities. Left ventricular diastolic parameters were  normal.   2. Right ventricular systolic function is normal. The right ventricular  size is normal. Tricuspid regurgitation signal is inadequate for assessing  PA pressure.   3. The mitral valve is grossly normal. No evidence of mitral valve  regurgitation. No evidence of mitral stenosis.   4. The aortic valve is grossly normal. Aortic valve regurgitation is not  visualized. No aortic stenosis is present.   5. The inferior vena cava is normal in size with <50% respiratory  variability, suggesting right atrial pressure of 8 mmHg.   Comparison(s): No prior Echocardiogram.    MYOCARDIAL IMAGING WITH SPECT (REST AND PHARMACOLOGIC-STRESS - 2 DAY PROTOCOL)   GATED LEFT  VENTRICULAR WALL MOTION STUDY   LEFT VENTRICULAR EJECTION FRACTION   TECHNIQUE: Standard myocardial SPECT imaging was performed after resting intravenous injection of 30 mCi Tc-46m tetrofosmin. Subsequently, on a second day, intravenous infusion of Lexiscan was performed under the supervision of the Cardiology staff. At peak effect of the drug, 30 mCi Tc-17m tetrofosmin was injected intravenously and standard myocardial SPECT imaging was performed. Quantitative gated imaging was also performed to evaluate left ventricular wall motion, and estimate left ventricular ejection fraction.   COMPARISON:  Chest radiograph 06/10/2021   FINDINGS: Perfusion: There is moderate decreased perfusion to the anterior and anterolateral wall involving the apical and mid segment. This decreased perfusion is more prominent on the rest imaging than the stress imaging. Findings indicate a prior scar/infarction.   No evidence reversible ischemia.   Wall Motion: Normal left ventricular wall motion. No left ventricular dilation.   Left Ventricular Ejection Fraction: Greater than 70 %   End diastolic volume 67 ml   End systolic volume 19 ml   IMPRESSION: 1. No reversible ischemia.  Suspect anterolateral infarct.   2. Normal left ventricular wall motion.   3. Left ventricular ejection fraction greater than 70%   4. Non invasive risk stratification*: Low   *2012 Appropriate Use Criteria for Coronary Revascularization Focused Update: J Am Coll Cardiol. 2012;59(9):857-881. http://content.dementiazones.com.aspx?articleid=[email or web address]     Electronically Signed   By: Genevive Bi M.D.   On: 06/12/2021 10:04   Monitor 03/14/22: Study Highlights      NSR with average HR 67 bpm   PVC burden 1.3%   Moderate correlation between PVC's and diary complaints   No atrial fibrillation / flutter etc.     Patch Wear Time:  12 days and 23 hours (2023-09-02T11:44:05-399 to 2023-09-15T10:55:58-0400)    Patient had a min HR of 49 bpm, max HR of 129 bpm, and avg HR of 67 bpm. Predominant underlying rhythm was Sinus Rhythm. 1 run of Supraventricular Tachycardia occurred lasting 5 beats with a max rate of 129 bpm (avg 109 bpm). Isolated SVEs were rare  (<1.0%), SVE Couplets were rare (<1.0%), and SVE Triplets were rare (<1.0%). Isolated VEs were occasional (1.2%, 14303), VE Couplets were rare (<1.0%, 25), and no VE Triplets were present. Ventricular Bigeminy and Trigeminy were present.  EKG:  EKG is not ordered today.  The ekg ordered today demonstrates N/A  Recent Labs: 04/22/2023: ALT 14; BUN 13; Creatinine, Ser 0.68; Hemoglobin 13.6; Platelets 227.0; Potassium 4.2; Sodium 143; TSH 1.47  Recent Lipid Panel    Component Value Date/Time   CHOL 129 04/22/2023 0933   CHOL 135 11/14/2021 0955   TRIG 77.0 04/22/2023 0933   HDL 61.00 04/22/2023 0933   HDL 70 11/14/2021 0955   CHOLHDL 2 04/22/2023 0933   VLDL 15.4 04/22/2023 0933   LDLCALC 53 04/22/2023 0933   LDLCALC 46 11/14/2021 0955     Risk Assessment/Calculations:      No BP recorded.  {Refresh Note OR Click here to enter BP  :1}***         Physical Exam:    VS:  There were no vitals taken for this visit.    Wt Readings from Last 3 Encounters:  05/15/23 237 lb 3.2 oz (107.6 kg)  04/22/23 240 lb (108.9 kg)  04/09/23 243 lb 8 oz (110.5 kg)     GEN:  Well nourished, morbidly obese in no acute distress HEENT: Normal NECK: No JVD; No carotid bruits LYMPHATICS: No lymphadenopathy CARDIAC: RRR, no murmurs, rubs, gallops RESPIRATORY:  Clear to auscultation without rales, wheezing or rhonchi  ABDOMEN: Soft, non-tender, non-distended MUSCULOSKELETAL:  No edema; No deformity  SKIN: Warm and dry NEUROLOGIC:  Alert and oriented x 3 PSYCHIATRIC:  Normal affect   ASSESSMENT:    No diagnosis found.  PLAN:    In order of problems listed above:  CAD s/p stenting of the first diagonal in July 2021. Normal Myoview Dec 2022.  One recent episode of angina. Resolved. Renewed Rx for Ntg. Will observe for now. If she has any recurrent chest pain will need ischemic evaluation Htn well controlled. HLD. LDL is at goal 41 DM on Ozempic. A1c 6.1% Morbid obesity. Encourage increased aerobic activity and healthy diet.       Follow up 6 months   Medication Adjustments/Labs and Tests Ordered: Current medicines are reviewed at length with the patient today.  Concerns regarding medicines are outlined above.  No orders of the defined types were placed in this encounter.  No orders of the defined types were placed in this encounter.   There are no Patient Instructions on file for this visit.   Signed, Eliberto Sole [place], MD  05/20/2023 3:18 PM    [facility]

## 2023-05-26 ENCOUNTER — Ambulatory Visit: Payer: PPO | Admitting: Cardiology

## 2023-07-23 ENCOUNTER — Ambulatory Visit: Payer: PPO | Admitting: Nurse Practitioner

## 2023-07-28 NOTE — Patient Instructions (Incomplete)

## 2023-07-28 NOTE — Progress Notes (Deleted)
 No chief complaint on file.   HISTORY OF PRESENT ILLNESS:  07/28/23 ALL:  [name] is a 73 y.o. female here today for follow up for right frontal headaches, atypical facial pain associated with lightheadedness. She was started on imipramine 25mg  daily and advised to increase dose to 50mg  daily if needed. Since,    HISTORY (copied from Dr Bonnita Hollow previous note)  I had the pleasure seeing patient, [name], at [facility] for neurologic consultation regarding her atypical headaches.   She s is a 73 year old woman who has had episodes of sharp pain in the right forehead over the past 2 years   Pain is intense and comes on suddenly.  It often lasts 5 minutes or so.  Moving makes the pain worse and staying still improves the pain.   She is having a spell about once a week.       After the pain passes in a few minutes, she feels lightheaded x 5 -10 more minutes.   She feels she might pass out but never does.     She has no pain in between the headahce episodes   Before these episodes, she did not have headaches.    She had zoster ophthalmicus on her LEFT side - we discussed that makes a relationship less likely   She sleeps poorly most nights - more trouble falling asleep than staying asleep.   She has nocturia 2-3 times.     MRI of the brain 08/31/2022 showed mild chronic microvascular ischemic change - a little more than typical for age but not unusual.   Mild atrophy.  No acute findings.     REVIEW OF SYSTEMS: Out of a complete 14 system review of symptoms, the patient complains only of the following symptoms, and all other reviewed systems are negative.   ALLERGIES: No Known Allergies   HOME MEDICATIONS: Outpatient Medications Prior to Visit  Medication Sig Dispense Refill   aspirin 81 MG EC tablet Take 81 mg by mouth daily.     atorvastatin (LIPITOR) 80 MG tablet TAKE 1 TABLET(80 MG) BY MOUTH DAILY 90 tablet 3   HYDROcodone-acetaminophen  (NORCO) 10-325 MG tablet Take 1 tablet by mouth 4 (four) times daily as needed.     ketoconazole (NIZORAL) 2 % cream Apply 1 Application topically daily. 15 g 0   losartan (COZAAR) 50 MG tablet TAKE 1 TABLET(50 MG) BY MOUTH DAILY 90 tablet 3   metoprolol succinate (TOPROL-XL) 50 MG 24 hr tablet TAKE 1 TABLET(50 MG) BY MOUTH DAILY 90 tablet 3   Multiple Vitamins-Minerals (CENTRUM SILVER 50+WOMEN) TABS Take 1 tablet by mouth daily.     nitroGLYCERIN (NITROSTAT) 0.4 MG SL tablet Place 1 tablet (0.4 mg total) under the tongue every 5 (five) minutes as needed for chest pain. (Patient not taking: Reported on 04/22/2023) 90 tablet 3   ondansetron (ZOFRAN ODT) 4 MG disintegrating tablet Take 1 tablet (4 mg total) by mouth every 8 (eight) hours as needed. (Patient not taking: Reported on 05/15/2023) 20 tablet 0   pantoprazole (PROTONIX) 40 MG tablet TAKE 1 TABLET(40 MG) BY MOUTH DAILY 90 tablet 3   Semaglutide,0.25 or 0.5MG /DOS, (OZEMPIC, 0.25 OR 0.5 MG/DOSE,) 2 MG/3ML SOPN INJECT 0.5MG  INTO THE SKIN ONCE A WEEK 9 mL 0   Vitamin D, Ergocalciferol, (DRISDOL) 1.25 MG (50000 UNIT) CAPS capsule Take 1 capsule (50,000 Units total) by mouth every 7 (seven) days. 12 capsule 0   No facility-administered medications prior to  visit.     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  Discussed as is on low dose, would be reasonable to discontinue and recheck in 6-8 weeks which she would like to do. Last Assessment & Plan:  States was started  after a partial thyroidectomy but does not recall any abnormal lab value and no symptoms.  Discussed as    Lumbar spondylosis 07/09/2017   Memory disorder 10/30/2015   Obesity, morbid, BMI 40.0-49.9 (HCC) 06/02/2014   Osteoporosis 05/02/2013   Overview:  DEXA 11/14 DEXA 11/14   Prediabetes    Restless legs syndrome 05/25/2012   Last Assessment & Plan:  patient reports using clonazepam about once weekly .  Discussed nonpharmacologic tips for treating RLS.  States other medicatiosn she had treid had not been effective (does not remember name) Will refill for now.  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   URI, acute 09/20/2015   Vitamin B 12 deficiency    Vitamin D deficiency 05/25/2012     PAST SURGICAL HISTORY: Past Surgical History:  Procedure Laterality Date   ABDOMINAL HYSTERECTOMY     APPENDECTOMY     CARDIAC CATHETERIZATION  12/23/2019   CATARACT EXTRACTION     CHOLECYSTECTOMY     CORONARY STENT INTERVENTION N/A 12/23/2019   Procedure: CORONARY STENT INTERVENTION;  Surgeon: Lyn Records, MD;  Location: [facility];  Service: Cardiovascular;  Laterality: N/A;   JOINT REPLACEMENT     2 total knee replacement   LASIK     LEFT HEART CATH AND CORONARY ANGIOGRAPHY N/A 12/23/2019   Procedure: LEFT HEART CATH AND CORONARY ANGIOGRAPHY;  Surgeon: Lyn Records, MD;  Location: [facility];  Service: Cardiovascular;  Laterality: N/A;   REVERSE SHOULDER ARTHROPLASTY Right 04/03/2020   Procedure: REVERSE SHOULDER ARTHROPLASTY;  Surgeon: Yolonda Kida, MD;  Location: [facility];  Service: Orthopedics;  Laterality: Right;  2.5 hrs   SHOULDER ARTHROSCOPY WITH ROTATOR CUFF REPAIR AND SUBACROMIAL DECOMPRESSION Left 10/25/2020   Procedure: Left shoulder arthroscopic, extensive debridement, subacromial decompression;  Surgeon: Yolonda Kida, MD;  Location[facility];   Service: Orthopedics;  Laterality: Left;  90 mins   THYROIDECTOMY     TONSILLECTOMY       FAMILY HISTORY: Family History  Problem Relation Age of Onset   Diabetes Mother    Cancer Mother        lung   Hypertension Mother    Cancer Father        lung   Diabetes Sister    Heart disease Brother    Diabetes Brother    Diabetes Brother    Breast cancer Neg Hx      SOCIAL HISTORY: Social History   Socioeconomic History   Marital status: Divorced    Spouse name: Not on file   Number of children: 1  Years of education: Not on file   Highest education level: Not on file  Occupational History   Not on file  Tobacco Use   Smoking status: Former    Current packs/day: 0.00    Types: Cigarettes    Quit date: 02/17/1987    Years since quitting: 36.4   Smokeless tobacco: Never  Vaping Use   Vaping status: Never Used  Substance and Sexual Activity   Alcohol use: No   Drug use: No   Sexual activity: Not Currently    Birth control/protection: Post-menopausal, Surgical    Comment: Hysterectomy  Other Topics Concern   Not on file  Social History Narrative      Social Drivers of Health   Financial Resource Strain: Low Risk  (11/24/2022)   Overall Financial Resource Strain (CARDIA)    Difficulty of Paying Living Expenses: Not hard at all  Food Insecurity: No Food Insecurity (11/24/2022)   Hunger Vital Sign    Worried About Running Out of Food in the Last Year: Never true    Ran Out of Food in the Last Year: Never true  Transportation Needs: No Transportation Needs (11/24/2022)   PRAPARE - Administrator, Civil Service (Medical): No    Lack of Transportation (Non-Medical): No  Physical Activity: Inactive (11/24/2022)   Exercise Vital Sign    Days of Exercise per Week: 0 days    Minutes of Exercise per Session: 0 min  Stress: No Stress Concern Present (11/24/2022)   [employer] of Occupational Health - Occupational Stress Questionnaire    Feeling of Stress :  Only a little  Social Connections: Not on file  Intimate Partner Violence: Not on file     PHYSICAL EXAM  There were no vitals filed for this visit. There is no height or weight on file to calculate BMI.  Generalized: Well developed, in no acute distress  Cardiology: normal rate and rhythm, no murmur auscultated  Respiratory: clear to auscultation bilaterally    Neurological examination  Mentation: Alert oriented to time, place, history taking. Follows all commands speech and language fluent Cranial nerve II-XII: Pupils were equal round reactive to light. Extraocular movements were full, visual field were full on confrontational test. Facial sensation and strength were normal. Uvula tongue midline. Head turning and shoulder shrug  were normal and symmetric. Motor: The motor testing reveals 5 over 5 strength of all 4 extremities. Good symmetric motor tone is noted throughout.  Sensory: Sensory testing is intact to soft touch on all 4 extremities. No evidence of extinction is noted.  Coordination: Cerebellar testing reveals good finger-nose-finger and heel-to-shin bilaterally.  Gait and station: Gait is normal. Tandem gait is normal. Romberg is negative. No drift is seen.  Reflexes: Deep tendon reflexes are symmetric and normal bilaterally.    DIAGNOSTIC DATA (LABS, IMAGING, TESTING) - I reviewed patient records, labs, notes, testing and imaging myself where available.  Lab Results  Component Value Date   WBC 5.3 04/22/2023   HGB 13.6 04/22/2023   HCT 42.0 04/22/2023   MCV 94.5 04/22/2023   PLT 227.0 04/22/2023      Component Value Date/Time   NA 143 04/22/2023 0933   NA 141 11/14/2021 0955   K 4.2 04/22/2023 0933   CL 106 04/22/2023 0933   CO2 28 04/22/2023 0933   GLUCOSE 136 (H) 04/22/2023 0933   BUN 13 04/22/2023 0933   BUN 12 11/14/2021 0955   CREATININE 0.68 04/22/2023 0933   CALCIUM 9.1 04/22/2023 0933  PROT 6.6 04/22/2023 0933   PROT 6.8 11/14/2021 0955    ALBUMIN 4.1 04/22/2023 0933   ALBUMIN 4.4 11/14/2021 0955   AST 13 04/22/2023 0933   ALT 14 04/22/2023 0933   ALKPHOS 105 04/22/2023 0933   BILITOT 1.3 (H) 04/22/2023 0933   BILITOT 1.2 11/14/2021 0955   GFRNONAA >60 05/29/2022 1515   GFRAA 105 04/30/2020 1218   Lab Results  Component Value Date   CHOL 129 04/22/2023   HDL 61.00 04/22/2023   LDLCALC 53 04/22/2023   TRIG 77.0 04/22/2023   CHOLHDL 2 04/22/2023   Lab Results  Component Value Date   HGBA1C 5.8 04/22/2023   Lab Results  Component Value Date   VITAMINB12 252 09/10/2022   Lab Results  Component Value Date   TSH 1.47 04/22/2023       10/30/2015    9:32 AM  MMSE - Mini Mental State Exam  Orientation to time 5  Orientation to Place 5  Registration 3  Attention/ Calculation 3  Recall 2  Language- name 2 objects 2  Language- repeat 1  Language- follow 3 step command 3  Language- read & follow direction 1  Write a sentence 1  Copy design 1  Total score 27         No data to display           ASSESSMENT AND PLAN  73 y.o. year old female  has a past medical history of Anosmia (10/30/2015), Atypical facial pain (10/30/2015), Chronic pain syndrome (07/09/2017), Coronary artery disease, Depression, Dyspnea on exertion (09/20/2015), Gastro-esophageal reflux, GERD (gastroesophageal reflux disease) (05/25/2012), Hyperlipidemia, Hypertension, Hypothyroidism (05/25/2012), Lumbar spondylosis (07/09/2017), Memory disorder (10/30/2015), Obesity, morbid, BMI 40.0-49.9 (HCC) (06/02/2014), Osteoporosis (05/02/2013), Prediabetes, Restless legs syndrome (05/25/2012), Thyroid disease, Unstable angina (HCC) (09/20/2015), URI, acute (09/20/2015), Vitamin B 12 deficiency, and Vitamin D deficiency (05/25/2012). here with    Acute nonintractable headache, unspecified headache type  Atypical facial pain  History of herpes zoster  Insomnia, unspecified type  [name] ***.  Healthy lifestyle habits  encouraged. *** will follow up with PCP as directed. *** will return to see me in ***, sooner if needed. *** verbalizes understanding and agreement with this plan.   No orders of the defined types were placed in this encounter.    No orders of the defined types were placed in this encounter.    Shawnie Dapper, MSN, FNP-C 07/28/2023, 4:15 PM  [facility] [address], [address] [place], [place] [address] [phone]

## 2023-08-06 ENCOUNTER — Ambulatory Visit: Payer: PPO | Admitting: Family Medicine

## 2023-08-06 DIAGNOSIS — R519 Headache, unspecified: Secondary | ICD-10-CM

## 2023-08-06 DIAGNOSIS — G501 Atypical facial pain: Secondary | ICD-10-CM

## 2023-08-06 DIAGNOSIS — G4700 Insomnia, unspecified: Secondary | ICD-10-CM

## 2023-08-06 DIAGNOSIS — Z8619 Personal history of other infectious and parasitic diseases: Secondary | ICD-10-CM

## 2023-08-26 ENCOUNTER — Ambulatory Visit: Payer: PPO | Admitting: Nurse Practitioner

## 2023-09-21 DIAGNOSIS — H04123 Dry eye syndrome of bilateral lacrimal glands: Secondary | ICD-10-CM | POA: Diagnosis not present

## 2023-09-29 DIAGNOSIS — M7918 Myalgia, other site: Secondary | ICD-10-CM | POA: Diagnosis not present

## 2023-09-30 ENCOUNTER — Encounter: Payer: Self-pay | Admitting: Nurse Practitioner

## 2023-09-30 ENCOUNTER — Encounter (INDEPENDENT_AMBULATORY_CARE_PROVIDER_SITE_OTHER): Payer: Self-pay

## 2023-09-30 ENCOUNTER — Ambulatory Visit (INDEPENDENT_AMBULATORY_CARE_PROVIDER_SITE_OTHER): Admitting: Nurse Practitioner

## 2023-09-30 VITALS — BP 128/84 | HR 67 | Temp 97.6°F | Ht 61.0 in | Wt 250.8 lb

## 2023-09-30 DIAGNOSIS — E782 Mixed hyperlipidemia: Secondary | ICD-10-CM

## 2023-09-30 DIAGNOSIS — I25118 Atherosclerotic heart disease of native coronary artery with other forms of angina pectoris: Secondary | ICD-10-CM

## 2023-09-30 DIAGNOSIS — R7303 Prediabetes: Secondary | ICD-10-CM | POA: Diagnosis not present

## 2023-09-30 DIAGNOSIS — I10 Essential (primary) hypertension: Secondary | ICD-10-CM | POA: Diagnosis not present

## 2023-09-30 DIAGNOSIS — E559 Vitamin D deficiency, unspecified: Secondary | ICD-10-CM

## 2023-09-30 DIAGNOSIS — G4700 Insomnia, unspecified: Secondary | ICD-10-CM | POA: Diagnosis not present

## 2023-09-30 DIAGNOSIS — I5032 Chronic diastolic (congestive) heart failure: Secondary | ICD-10-CM | POA: Diagnosis not present

## 2023-09-30 LAB — LIPID PANEL
Cholesterol: 123 mg/dL (ref 0–200)
HDL: 59.6 mg/dL (ref >39.00)
LDL Cholesterol: 45 mg/dL (ref 0–99)
NonHDL: 63.47
Total CHOL/HDL Ratio: 2
Triglycerides: 94 mg/dL (ref 0.0–149.0)
VLDL: 18.8 mg/dL (ref 0.0–40.0)

## 2023-09-30 LAB — CBC WITH DIFFERENTIAL/PLATELET
Basophils Absolute: 0 10*3/uL (ref 0.0–0.1)
Basophils Relative: 0.4 % (ref 0.0–3.0)
Eosinophils Absolute: 0.1 10*3/uL (ref 0.0–0.7)
Eosinophils Relative: 2.5 % (ref 0.0–5.0)
HCT: 40.7 % (ref 36.0–46.0)
Hemoglobin: 13.4 g/dL (ref 12.0–15.0)
Lymphocytes Relative: 41.5 % (ref 12.0–46.0)
Lymphs Abs: 2.3 10*3/uL (ref 0.7–4.0)
MCHC: 32.9 g/dL (ref 30.0–36.0)
MCV: 95.6 fl (ref 78.0–100.0)
Monocytes Absolute: 0.3 10*3/uL (ref 0.1–1.0)
Monocytes Relative: 6.1 % (ref 3.0–12.0)
Neutro Abs: 2.8 10*3/uL (ref 1.4–7.7)
Neutrophils Relative %: 49.5 % (ref 43.0–77.0)
Platelets: 197 10*3/uL (ref 150.0–400.0)
RBC: 4.26 Mil/uL (ref 3.87–5.11)
RDW: 14.8 % (ref 11.5–15.5)
WBC: 5.7 10*3/uL (ref 4.0–10.5)

## 2023-09-30 LAB — COMPREHENSIVE METABOLIC PANEL WITH GFR
ALT: 13 U/L (ref 0–35)
AST: 15 U/L (ref 0–37)
Albumin: 4.2 g/dL (ref 3.5–5.2)
Alkaline Phosphatase: 118 U/L — ABNORMAL HIGH (ref 39–117)
BUN: 16 mg/dL (ref 6–23)
CO2: 28 meq/L (ref 19–32)
Calcium: 8.7 mg/dL (ref 8.4–10.5)
Chloride: 105 meq/L (ref 96–112)
Creatinine, Ser: 0.7 mg/dL (ref 0.40–1.20)
GFR: 84.95 mL/min (ref >60.00)
Glucose, Bld: 90 mg/dL (ref 70–99)
Potassium: 4.7 meq/L (ref 3.5–5.1)
Sodium: 140 meq/L (ref 135–145)
Total Bilirubin: 1.1 mg/dL (ref 0.2–1.2)
Total Protein: 6.3 g/dL (ref 6.0–8.3)

## 2023-09-30 LAB — HEMOGLOBIN A1C: Hgb A1c MFr Bld: 6 % (ref 4.6–6.5)

## 2023-09-30 LAB — VITAMIN D 25 HYDROXY (VIT D DEFICIENCY, FRACTURES): VITD: 19.19 ng/mL — ABNORMAL LOW (ref 30.00–100.00)

## 2023-09-30 MED ORDER — METFORMIN HCL ER 500 MG PO TB24: 500.0000 mg | ORAL_TABLET | Freq: Every day | ORAL | 1 refills | Status: DC

## 2023-09-30 MED ORDER — VITAMIN D (ERGOCALCIFEROL) 1.25 MG (50000 UNIT) PO CAPS: 50000.0000 [IU] | ORAL_CAPSULE | ORAL | 0 refills | Status: DC

## 2023-09-30 NOTE — Assessment & Plan Note (Signed)
 She experiences difficulty falling asleep, staying awake until 2-3 AM. Z-Quil has lost efficacy, and melatonin has not been tried. Chronic insomnia affects nightly sleep. Try over-the-counter options like melatonin and maintain a regular sleep schedule and sleep hygiene practices.

## 2023-09-30 NOTE — Assessment & Plan Note (Signed)
 She is still finishing the prescription vitamin D from 6 months ago, however has missed a few weeks. Will check vitamin D levels and adjust regimen based on results.

## 2023-09-30 NOTE — Progress Notes (Signed)
 Established Patient Office Visit  Subjective   Patient ID: [name], female    DOB: 12-Sep-1950  Age: 73 y.o. MRN: [id number]  Chief Complaint  Patient presents with   Prediabetes    Follow up, discuss weight loss options    HPI  Discussed the use of AI scribe software for clinical note transcription with the patient, who gave verbal consent to proceed.  History of Present Illness   The patient, with a history of heart disease and prediabetes, presents with concerns about the cost of her weight loss medication, Ozempic, which she has stopped taking due to its high cost. She inquires about other weight loss medications that might be covered by Medicare. She denies any chest pains or shortness of breath.  The patient also reports difficulty sleeping, with trouble falling asleep and staying asleep. She describes lying awake until two or three o'clock in the morning, often getting up to watch TV before attempting to sleep again. She has tried Z-Quil, which initially helped but seemed to lose its effectiveness after a few nights. This sleep disturbance occurs every night.      ROS See pertinent positives and negatives per HPI.    Objective:     BP 128/84 (BP Location: Left Arm, Patient Position: Sitting, Cuff Size: Large)   Pulse 67   Temp 97.6 F (36.4 C)   Ht 5\' 1"  (1.549 m)   Wt 250 lb 12.8 oz (113.8 kg)   SpO2 97%   BMI 47.39 kg/m  BP Readings from Last 3 Encounters:  09/30/23 128/84  05/15/23 119/68  04/22/23 120/82   Wt Readings from Last 3 Encounters:  09/30/23 250 lb 12.8 oz (113.8 kg)  05/15/23 237 lb 3.2 oz (107.6 kg)  04/22/23 240 lb (108.9 kg)      Physical Exam Vitals and nursing note reviewed.  Constitutional:      General: She is not in acute distress.    Appearance: Normal appearance.  HENT:     Head: Normocephalic.  Eyes:     Conjunctiva/sclera: Conjunctivae normal.  Cardiovascular:     Rate and Rhythm: Normal rate and regular rhythm.      Pulses: Normal pulses.     Heart sounds: Normal heart sounds.  Pulmonary:     Effort: Pulmonary effort is normal.     Breath sounds: Normal breath sounds.  Musculoskeletal:     Cervical back: Normal range of motion.  Skin:    General: Skin is warm.  Neurological:     General: No focal deficit present.     Mental Status: She is alert and oriented to person, place, and time.  Psychiatric:        Mood and Affect: Mood normal.        Behavior: Behavior normal.        Thought Content: Thought content normal.        Judgment: Judgment normal.      Assessment & Plan:   Problem List Items Addressed This Visit       Cardiovascular and Mediastinum   Essential hypertension   Chronic, stable.  Continue metoprolol 50 mg daily and losartan 50 mg daily.  Check CMP, CBC today.  Follow-up in 6 months.      Relevant Orders   CBC with Differential/Platelet   Comprehensive metabolic panel with GFR   Lipid panel   Chronic diastolic CHF (congestive heart failure) (HCC) - Primary   Chronic, stable.  Euvolemic on exam today.  Continue losartan  50 mg daily and Toprol-XL 50 mg daily.  Most recent echocardiogram showed an EF of 65 to 70%.      Coronary artery disease of native artery of native heart with stable angina pectoris (HCC)   Chronic, stable.  She is currently chest pain-free.  Will have her continue atorvastatin 80 mg daily, aspirin 81 mg daily, Toprol-XL 50 mg daily, and losartan 50 mg daily.  Check CMP, CBC, lipid panel today.         Other   Hyperlipidemia   Chronic, stable.  Continue atorvastatin 80 mg daily.  Check CMP, CBC, lipid panel today.      Relevant Orders   CBC with Differential/Platelet   Comprehensive metabolic panel with GFR   Lipid panel   Morbid obesity (HCC)   Semaglutide was discontinued due to price - most likely from [facility] deductible, as it was covered last year. Emphasis was placed on portion control and healthier food choices. [facility] was considered for additional support. Will start metformin 500mg  daily with food. Encourage portion control and healthier food choices.      Relevant Medications   metFORMIN (GLUCOPHAGE-XR) 500 MG 24 hr tablet   Other Relevant Orders   Amb Ref to Medical Weight Management   Vitamin D deficiency   She is still finishing the prescription vitamin D from 6 months ago, however has missed a few weeks. Will check vitamin D levels and adjust regimen based on results.       Relevant Orders   VITAMIN D 25 Hydroxy (Vit-D Deficiency, Fractures)   Prediabetes   Chronic, stable.  She was taking Ozempic until a few weeks ago.  She is unable to afford this due to deductible. Will start metformin XR 500mg  daily with food.  Continue focus on nutrition and exercise.  Check A1c today.      Relevant Orders   Hemoglobin A1c   Insomnia   She experiences difficulty falling asleep, staying awake until 2-3 AM. Z-Quil has lost efficacy, and melatonin has not been tried. Chronic insomnia affects nightly sleep. Try over-the-counter options like melatonin and maintain a regular sleep schedule and sleep hygiene practices.      Return in about 6 months (around 03/31/2024) for CPE.    Gerre Scull, NP

## 2023-09-30 NOTE — Assessment & Plan Note (Signed)
 Semaglutide was discontinued due to price - most likely from [facility] deductible, as it was covered last year. Emphasis was placed on portion control and healthier food choices. [facility] was considered for additional support. Will start metformin 500mg  daily with food. Encourage portion control and healthier food choices.

## 2023-09-30 NOTE — Assessment & Plan Note (Signed)
 Chronic, stable.  She is currently chest pain-free.  Will have her continue atorvastatin 80 mg daily, aspirin 81 mg daily, Toprol-XL 50 mg daily, and losartan 50 mg daily.  Check CMP, CBC, lipid panel today.

## 2023-09-30 NOTE — Assessment & Plan Note (Signed)
 Chronic, stable.  She was taking Ozempic until a few weeks ago.  She is unable to afford this due to deductible. Will start metformin XR 500mg  daily with food.  Continue focus on nutrition and exercise.  Check A1c today.

## 2023-09-30 NOTE — Assessment & Plan Note (Signed)
Chronic, stable.  Euvolemic on exam today.  Continue losartan 50 mg daily and Toprol-XL 50 mg daily.  Most recent echocardiogram showed an EF of 65 to 70%.

## 2023-09-30 NOTE — Assessment & Plan Note (Signed)
Chronic, stable.  Continue metoprolol 50 mg daily and losartan 50 mg daily.  Check CMP, CBC today.  Follow-up in 6 months.

## 2023-09-30 NOTE — Addendum Note (Signed)
 Addended by: Rodman Pickle A on: 09/30/2023 05:01 PM   Modules accepted: Orders

## 2023-09-30 NOTE — Patient Instructions (Signed)
 It was great to see you!  Start metformin 1 tablet daily with food   Try to limit sweets and portion sizes   I have placed a referral to the weight loss clinic  Let's follow-up in 6 months, sooner if you have concerns.  If a referral was placed today, you will be contacted for an appointment. Please note that routine referrals can sometimes take up to 3-4 weeks to process. Please call our office if you haven't heard anything after this time frame.  Take care,  Rodman Pickle, NP

## 2023-09-30 NOTE — Assessment & Plan Note (Signed)
Chronic, stable.  Continue atorvastatin 80 mg daily.  Check CMP, CBC, lipid panel today.

## 2023-10-29 DIAGNOSIS — L821 Other seborrheic keratosis: Secondary | ICD-10-CM | POA: Diagnosis not present

## 2023-10-29 DIAGNOSIS — L538 Other specified erythematous conditions: Secondary | ICD-10-CM | POA: Diagnosis not present

## 2023-10-29 DIAGNOSIS — Z85828 Personal history of other malignant neoplasm of skin: Secondary | ICD-10-CM | POA: Diagnosis not present

## 2023-10-29 DIAGNOSIS — L853 Xerosis cutis: Secondary | ICD-10-CM | POA: Diagnosis not present

## 2023-10-29 DIAGNOSIS — D485 Neoplasm of uncertain behavior of skin: Secondary | ICD-10-CM | POA: Diagnosis not present

## 2023-10-29 DIAGNOSIS — R208 Other disturbances of skin sensation: Secondary | ICD-10-CM | POA: Diagnosis not present

## 2023-10-29 DIAGNOSIS — Z08 Encounter for follow-up examination after completed treatment for malignant neoplasm: Secondary | ICD-10-CM | POA: Diagnosis not present

## 2023-10-29 DIAGNOSIS — L2989 Other pruritus: Secondary | ICD-10-CM | POA: Diagnosis not present

## 2023-10-29 DIAGNOSIS — L820 Inflamed seborrheic keratosis: Secondary | ICD-10-CM | POA: Diagnosis not present

## 2023-10-29 DIAGNOSIS — D225 Melanocytic nevi of trunk: Secondary | ICD-10-CM | POA: Diagnosis not present

## 2023-10-29 DIAGNOSIS — D235 Other benign neoplasm of skin of trunk: Secondary | ICD-10-CM | POA: Diagnosis not present

## 2023-11-20 ENCOUNTER — Encounter

## 2023-12-04 ENCOUNTER — Other Ambulatory Visit: Payer: Self-pay | Admitting: Nurse Practitioner

## 2023-12-04 MED ORDER — LOSARTAN POTASSIUM 50 MG PO TABS: 50.0000 mg | ORAL_TABLET | Freq: Every day | ORAL | 2 refills | Status: AC

## 2023-12-04 NOTE — Telephone Encounter (Signed)
 Copied from CRM [id number]. Topic: Clinical - Medication Refill >> Dec 04, 2023  2:25 PM Mesmerise C wrote: Medication:  losartan  (COZAAR ) 50 MG tablet  Has the patient contacted their pharmacy? Yes (Agent: If no, request that the patient contact the pharmacy for the refill. If patient does not wish to contact the pharmacy document the reason why and proceed with request.) (Agent: If yes, when and what did the pharmacy advise?)  This is the patient's preferred pharmacy:  [facility] [address] [place], [place] - [address] [place] [address]  Phone: [phone] Fax: [phone]  Is this the correct pharmacy for this prescription? Yes If no, delete pharmacy and type the correct one.   Has the prescription been filled recently? No  Is the patient out of the medication? No  Has the patient been seen for an appointment in the last year OR does the patient have an upcoming appointment? Yes  Can we respond through MyChart? Yes  Agent: Please be advised that Rx refills may take up to 3 business days. We ask that you follow-up with your pharmacy.

## 2023-12-04 NOTE — Telephone Encounter (Signed)
 Requesting: losartan  (COZAAR ) 50 MG tablet  Last Visit: 09/30/2023 Next Visit: 04/25/2024 Last Refill: 05/04/2023  Please Advise

## 2023-12-07 ENCOUNTER — Encounter: Payer: Self-pay | Admitting: Nurse Practitioner

## 2024-01-08 ENCOUNTER — Ambulatory Visit

## 2024-01-26 DIAGNOSIS — M5416 Radiculopathy, lumbar region: Secondary | ICD-10-CM | POA: Diagnosis not present

## 2024-01-27 ENCOUNTER — Telehealth: Payer: Self-pay | Admitting: Cardiology

## 2024-01-27 NOTE — Telephone Encounter (Signed)
 Patient c/o Palpitations: STAT if patient c/o lightheadedness, shortness of breath, or chest pain  How long have you had palpitations/irregular HR/ Afib? Are you having the symptoms now? Heart flutters   Are you currently experiencing lightheadedness, SOB or CP? no  Do you have a history of afib (atrial fibrillation) or irregular heart rhythm? no  Have you checked your BP or HR? (document readings if available):    Are you experiencing any other symptoms?  Headaches

## 2024-01-27 NOTE — Telephone Encounter (Signed)
 Spoke to patient she stated she has been having heart flutters the past 1 week.No chest pain.Appointment scheduled with Dr.Jordan 8/12 at 1:20 pm.

## 2024-01-29 NOTE — Progress Notes (Signed)
 Cardiology Office Note:    Date:  02/02/2024   ID:  [name], DOB March 14, 1951, MRN [id number]  PCP:  Nedra Tinnie LABOR, NP   [facility] Providers Cardiologist:  Victory LELON Claudene DOUGLAS, MD (Inactive)     Referring MD: Nedra Tinnie LABOR, NP   Chief Complaint  Patient presents with   Coronary Artery Disease    History of Present Illness:    [name] is a 73 y.o. female seen for follow up CAD. Formerly seen by Dr Victory Claudene. She has a history of hypertension, obesity, hyperlipidemia, DM II, July 2021 unstable angina treated with DES of the first diagonal with good result.  Intolerant of Brilinta  due to dyspnea.   She reports for the past 2 weeks she is having a fluttering sensation and skipping in her heart and throat usually after eating or drinking something. States this a strange feeling. Is drinking a lot of caffeine.   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  Discussed as is on low dose, would be reasonable to discontinue and recheck in 6-8 weeks which she would like to do. Last Assessment & Plan:  States was started after a partial thyroidectomy but does not recall any abnormal lab value and no symptoms.  Discussed as    Lumbar spondylosis 07/09/2017   Memory disorder 10/30/2015   Obesity, morbid, BMI 40.0-49.9 (HCC) 06/02/2014   Osteoporosis 05/02/2013   Overview:  DEXA 11/14 DEXA 11/14   Prediabetes    Restless legs syndrome 05/25/2012   Last Assessment & Plan:  patient reports using  clonazepam  about once weekly .  Discussed nonpharmacologic tips for treating RLS.  States other medicatiosn she had treid had not been effective (does not remember name) Will refill for now.  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   URI, acute 09/20/2015   Vitamin B 12 deficiency    Vitamin D  deficiency 05/25/2012    Past Surgical History:  Procedure Laterality Date   ABDOMINAL HYSTERECTOMY     APPENDECTOMY     CARDIAC CATHETERIZATION  12/23/2019   CATARACT EXTRACTION     CHOLECYSTECTOMY     CORONARY STENT INTERVENTION N/A 12/23/2019   Procedure: CORONARY STENT INTERVENTION;  Surgeon: Claudene Victory LELON, MD;  Location: [facility];  Service: Cardiovascular;  Laterality: N/A;   JOINT REPLACEMENT     2 total knee replacement   LASIK     LEFT HEART CATH AND CORONARY ANGIOGRAPHY N/A 12/23/2019   Procedure: LEFT HEART CATH AND CORONARY ANGIOGRAPHY;  Surgeon: Claudene Victory LELON, MD;  Location: [facility];  Service: Cardiovascular;  Laterality: N/A;   REVERSE SHOULDER ARTHROPLASTY Right 04/03/2020   Procedure: REVERSE SHOULDER ARTHROPLASTY;  Surgeon: Sharl Selinda Dover, MD;  Location: [facility];  Service: Orthopedics;  Laterality: Right;  2.5 hrs   SHOULDER ARTHROSCOPY WITH ROTATOR CUFF REPAIR  AND SUBACROMIAL DECOMPRESSION Left 10/25/2020   Procedure: Left shoulder arthroscopic, extensive debridement, subacromial decompression;  Surgeon: Sharl Selinda Dover, MD;  Location: [facility] OR;  Service: Orthopedics;  Laterality: Left;  90 mins   THYROIDECTOMY     TONSILLECTOMY      Current Medications: Current Meds  Medication Sig   aspirin  81 MG EC tablet Take 81 mg by mouth daily.   atorvastatin  (LIPITOR ) 80 MG tablet TAKE 1 TABLET(80 MG) BY MOUTH DAILY   Cholecalciferol (VITAMIN D -3 PO) Take 1 tablet by mouth daily.   HYDROcodone -acetaminophen  (NORCO) 10-325 MG tablet Take 1 tablet by  mouth 4 (four) times daily as needed.   losartan  (COZAAR ) 50 MG tablet Take 1 tablet (50 mg total) by mouth daily.   metFORMIN  (GLUCOPHAGE -XR) 500 MG 24 hr tablet Take 1 tablet (500 mg total) by mouth daily with breakfast.   metoprolol  succinate (TOPROL -XL) 50 MG 24 hr tablet TAKE 1 TABLET(50 MG) BY MOUTH DAILY   Multiple Vitamins-Minerals (CENTRUM SILVER 50+WOMEN) TABS Take 1 tablet by mouth daily.   nitroGLYCERIN  (NITROSTAT ) 0.4 MG SL tablet Place 1 tablet (0.4 mg total) under the tongue every 5 (five) minutes as needed for chest pain.   pantoprazole  (PROTONIX ) 40 MG tablet TAKE 1 TABLET(40 MG) BY MOUTH DAILY     Allergies:   Patient has no known allergies.   Social History   Socioeconomic History   Marital status: Divorced    Spouse name: Not on file   Number of children: 1   Years of education: Not on file   Highest education level: Not on file  Occupational History   Not on file  Tobacco Use   Smoking status: Former    Current packs/day: 0.00    Types: Cigarettes    Quit date: 02/17/1987    Years since quitting: 36.9   Smokeless tobacco: Never  Vaping Use   Vaping status: Never Used  Substance and Sexual Activity   Alcohol use: No   Drug use: No   Sexual activity: Not Currently    Birth control/protection: Post-menopausal, Surgical    Comment: Hysterectomy  Other Topics Concern   Not on file  Social History Narrative      Social Drivers of Health   Financial Resource Strain: Low Risk  (11/24/2022)   Overall Financial Resource Strain (CARDIA)    Difficulty of Paying Living Expenses: Not hard at all  Food Insecurity: No Food Insecurity (11/24/2022)   Hunger Vital Sign    Worried About Running Out of Food in the Last Year: Never true    Ran Out of Food in the Last Year: Never true  Transportation Needs: No Transportation Needs (11/24/2022)   PRAPARE - Administrator, Civil Service (Medical): No    Lack of Transportation (Non-Medical): No  Physical Activity:  Inactive (11/24/2022)   Exercise Vital Sign    Days of Exercise per Week: 0 days    Minutes of Exercise per Session: 0 min  Stress: No Stress Concern Present (11/24/2022)   [employer] of Occupational Health - Occupational Stress Questionnaire    Feeling of Stress : Only a little  Social Connections: Not on file     Family History: The patient's family history includes Cancer in her father and mother; Diabetes in her brother, brother, mother, and sister; Heart disease in her brother; Hypertension in her mother. There is no history of Breast cancer.  ROS:   Please see the history of present illness.  All other systems reviewed and are negative.  EKGs/Labs/Other Studies Reviewed:    Cardiac cath 12/23/19:  CORONARY STENT INTERVENTION  LEFT HEART CATH AND CORONARY ANGIOGRAPHY   Conclusion  2-week history of progressive new onset angina. 99% large first diagonal treated with 2.75 x 15 Onyx postdilated to 3.0 mm and 0% stenosis. Left main is widely patent LAD contains mid eccentric 50% stenosis. Circumflex gives 1 large tortuous obtuse marginal and is free of disease. Dominant with luminal irregularities in the mid body.  No significant obstruction. Very mild mid anterior wall hypokinesis.  EF 50 to 55% which is mildly reduced for female.  LVEDP is 19 mmHg.   RECOMMENDATIONS:   Dual antiplatelet therapy for at least 6 months.  Initially aspirin  and Brilinta .  After 4 to 6 weeks Brilinta  could be decreased in intensity to clopidogrel  plus aspirin . High intensity statin therapy added. Low-dose beta-blocker therapy added. Overnight observation since we have both radial and femoral access during the procedure.re reviewed today:   Echo 06/11/21:  IMPRESSIONS     1. Left ventricular ejection fraction, by estimation, is 65 to 70%. The  left ventricle has normal function. The left ventricle has no regional  wall motion abnormalities. Left ventricular diastolic parameters were   normal.   2. Right ventricular systolic function is normal. The right ventricular  size is normal. Tricuspid regurgitation signal is inadequate for assessing  PA pressure.   3. The mitral valve is grossly normal. No evidence of mitral valve  regurgitation. No evidence of mitral stenosis.   4. The aortic valve is grossly normal. Aortic valve regurgitation is not  visualized. No aortic stenosis is present.   5. The inferior vena cava is normal in size with <50% respiratory  variability, suggesting right atrial pressure of 8 mmHg.   Comparison(s): No prior Echocardiogram.    MYOCARDIAL IMAGING WITH SPECT (REST AND PHARMACOLOGIC-STRESS - 2 DAY PROTOCOL)   GATED LEFT VENTRICULAR WALL MOTION STUDY   LEFT VENTRICULAR EJECTION FRACTION   TECHNIQUE: Standard myocardial SPECT imaging was performed after resting intravenous injection of 30 mCi Tc-3m tetrofosmin . Subsequently, on a second day, intravenous infusion of Lexiscan  was performed under the supervision of the Cardiology staff. At peak effect of the drug, 30 mCi Tc-19m tetrofosmin  was injected intravenously and standard myocardial SPECT imaging was performed. Quantitative gated imaging was also performed to evaluate left ventricular wall motion, and estimate left ventricular ejection fraction.   COMPARISON:  Chest radiograph 06/10/2021   FINDINGS: Perfusion: There is moderate decreased perfusion to the anterior and anterolateral wall involving the apical and mid segment. This decreased perfusion is more prominent on the rest imaging than the stress imaging. Findings indicate a prior scar/infarction.   No evidence reversible ischemia.   Wall Motion: Normal left ventricular wall motion. No left ventricular dilation.   Left Ventricular Ejection Fraction: Greater than 70 %   End diastolic volume 67 ml   End systolic volume 19 ml   IMPRESSION: 1. No reversible ischemia.  Suspect anterolateral infarct.   2. Normal left  ventricular wall motion.   3. Left ventricular ejection fraction greater than 70%   4. Non invasive risk stratification*: Low   *2012 Appropriate Use Criteria for Coronary Revascularization Focused Update: J Am Coll Cardiol. 2012;59(9):857-881. http://content.dementiazones.com.aspx?articleid=1201161     Electronically Signed   By: Jackquline Boxer M.D.   On: 06/12/2021 10:04   Monitor 03/14/22: Study Highlights      NSR with average HR 67 bpm   PVC burden  1.3%   Moderate correlation between PVC's and diary complaints   No atrial fibrillation / flutter etc.     Patch Wear Time:  12 days and 23 hours (2023-09-02T11:44:05-399 to 2023-09-15T10:55:58-0400)   Patient had a min HR of 49 bpm, max HR of 129 bpm, and avg HR of 67 bpm. Predominant underlying rhythm was Sinus Rhythm. 1 run of Supraventricular Tachycardia occurred lasting 5 beats with a max rate of 129 bpm (avg 109 bpm). Isolated SVEs were rare  (<1.0%), SVE Couplets were rare (<1.0%), and SVE Triplets were rare (<1.0%). Isolated VEs were occasional (1.2%, 14303), VE Couplets were rare (<1.0%, 25), and no VE Triplets were present. Ventricular Bigeminy and Trigeminy were present.     EKG Interpretation Date/Time:  Tuesday February 02 2024 13:29:30 EDT Ventricular Rate:  67 PR Interval:  210 QRS Duration:  90 QT Interval:  416 QTC Calculation: 439 R Axis:   -6  Text Interpretation: Sinus rhythm with 1st degree A-V block Minimal voltage criteria for LVH, may be normal variant ( R in aVL ) Inferior infarct , age undetermined T wave abnormality consider anterior ischemia When compared with ECG of 29-May-2022 14:41, No significant change was found Confirmed by [place], Tiea Manninen [id number]) on 02/02/2024 1:34:04 PM    Recent Labs: 04/22/2023: TSH 1.47 09/30/2023: ALT 13; BUN 16; Creatinine, Ser 0.70; Hemoglobin 13.4; Platelets 197.0; Potassium 4.7; Sodium 140  Recent Lipid Panel    Component Value Date/Time   CHOL 123 09/30/2023  1413   CHOL 135 11/14/2021 0955   TRIG 94.0 09/30/2023 1413   HDL 59.60 09/30/2023 1413   HDL 70 11/14/2021 0955   CHOLHDL 2 09/30/2023 1413   VLDL 18.8 09/30/2023 1413   LDLCALC 45 09/30/2023 1413   LDLCALC 46 11/14/2021 0955     Risk Assessment/Calculations:                Physical Exam:    VS:  BP 136/86 (BP Location: Right Arm, Patient Position: Sitting, Cuff Size: Large)   Pulse 67   Ht 5' 2 (1.575 m)   Wt 252 lb 12.8 oz (114.7 kg)   SpO2 98%   BMI 46.24 kg/m     Wt Readings from Last 3 Encounters:  02/02/24 252 lb 12.8 oz (114.7 kg)  09/30/23 250 lb 12.8 oz (113.8 kg)  05/15/23 237 lb 3.2 oz (107.6 kg)     GEN:  Well nourished, morbidly obese in no acute distress HEENT: Normal NECK: No JVD; No carotid bruits LYMPHATICS: No lymphadenopathy CARDIAC: RRR, no murmurs, rubs, gallops RESPIRATORY:  Clear to auscultation without rales, wheezing or rhonchi  ABDOMEN: Soft, non-tender, non-distended MUSCULOSKELETAL:  No edema; No deformity  SKIN: Warm and dry NEUROLOGIC:  Alert and oriented x 3 PSYCHIATRIC:  Normal affect   ASSESSMENT:    1. Essential hypertension   2. Palpitations   3. Coronary artery disease of native artery of native heart with stable angina pectoris (HCC)   4. Obesity, morbid, BMI 40.0-49.9 (HCC)   5. Hyperlipidemia LDL goal <70     PLAN:    In order of problems listed above:  CAD s/p stenting of the first diagonal in July 2021. Normal Myoview  Dec 2022. She is having some feelings in her chest that she thinks is like when she had her stent. The record shows she was having chest pressure at that time. At any rate will evaluate for ischemia with a Myoview  study which we can compare with 2022.  HTN well continue Toprol   and losartan  HLD. LDL is at goal 45 on statin DM on Ozempic . A1c 6.0% Morbid obesity. Encourage increased aerobic activity and healthy diet. History of PVCs. Wore event monitor in 2017, Feb 2023 and again in Sept 2023. No  afib. Recommend reduction in caffeine intake. Continue metoprolol        Follow up 6 months   Medication Adjustments/Labs and Tests Ordered: Current medicines are reviewed at length with the patient today.  Concerns regarding medicines are outlined above.  Orders Placed This Encounter  Procedures   Cardiac Stress Test: Informed Consent Details: Physician/Practitioner Attestation; Transcribe to consent form and obtain patient signature   EKG 12-Lead   No orders of the defined types were placed in this encounter.   There are no Patient Instructions on file for this visit.   Signed, Lyrick Lagrand [place], MD  02/02/2024 1:46 PM    [facility]

## 2024-02-02 ENCOUNTER — Ambulatory Visit: Attending: Cardiology | Admitting: Cardiology

## 2024-02-02 ENCOUNTER — Encounter: Payer: Self-pay | Admitting: Cardiology

## 2024-02-02 VITALS — BP 136/86 | HR 67 | Ht 62.0 in | Wt 252.8 lb

## 2024-02-02 DIAGNOSIS — I10 Essential (primary) hypertension: Secondary | ICD-10-CM | POA: Diagnosis not present

## 2024-02-02 DIAGNOSIS — E785 Hyperlipidemia, unspecified: Secondary | ICD-10-CM | POA: Diagnosis not present

## 2024-02-02 DIAGNOSIS — I25118 Atherosclerotic heart disease of native coronary artery with other forms of angina pectoris: Secondary | ICD-10-CM

## 2024-02-02 DIAGNOSIS — R002 Palpitations: Secondary | ICD-10-CM

## 2024-02-02 NOTE — Patient Instructions (Signed)
 Medication Instructions:  Continue same medications *If you need a refill on your cardiac medications before your next appointment, please call your pharmacy*  Lab Work: None ordered  Testing/Procedures: Lexiscan  Myoview   No Breakfast Water only  No caffeine morning of test No lotion or perfume Wear comfortable clothing   Follow-Up: At [facility], you and your health needs are our priority.  As part of our continuing mission to provide you with exceptional heart care, our providers are all part of one team.  This team includes your primary Cardiologist (physician) and Advanced Practice Providers or APPs (Physician Assistants and Nurse Practitioners) who all work together to provide you with the care you need, when you need it.  Your next appointment:  To Be Determined    Provider:  Dr.Jordan   We recommend signing up for the patient portal called MyChart.  Sign up information is provided on this After Visit Summary.  MyChart is used to connect with patients for Virtual Visits (Telemedicine).  Patients are able to view lab/test results, encounter notes, upcoming appointments, etc.  Non-urgent messages can be sent to your provider as well.   To learn more about what you can do with MyChart, go to [email or web address].

## 2024-02-15 ENCOUNTER — Other Ambulatory Visit: Payer: Self-pay | Admitting: Cardiology

## 2024-02-15 DIAGNOSIS — R002 Palpitations: Secondary | ICD-10-CM

## 2024-02-15 DIAGNOSIS — I25118 Atherosclerotic heart disease of native coronary artery with other forms of angina pectoris: Secondary | ICD-10-CM

## 2024-02-15 DIAGNOSIS — I10 Essential (primary) hypertension: Secondary | ICD-10-CM

## 2024-02-15 DIAGNOSIS — E785 Hyperlipidemia, unspecified: Secondary | ICD-10-CM

## 2024-02-16 ENCOUNTER — Ambulatory Visit: Payer: Self-pay | Admitting: Cardiology

## 2024-02-16 ENCOUNTER — Ambulatory Visit (HOSPITAL_COMMUNITY)
Admission: RE | Admit: 2024-02-16 | Discharge: 2024-02-16 | Disposition: A | Discharge status: Home / Self Care | Source: Ambulatory Visit | Attending: Cardiovascular Disease | Admitting: Cardiovascular Disease

## 2024-02-16 DIAGNOSIS — I25118 Atherosclerotic heart disease of native coronary artery with other forms of angina pectoris: Secondary | ICD-10-CM | POA: Diagnosis not present

## 2024-02-16 DIAGNOSIS — R002 Palpitations: Secondary | ICD-10-CM | POA: Insufficient documentation

## 2024-02-16 DIAGNOSIS — E785 Hyperlipidemia, unspecified: Secondary | ICD-10-CM | POA: Insufficient documentation

## 2024-02-16 DIAGNOSIS — I10 Essential (primary) hypertension: Secondary | ICD-10-CM | POA: Diagnosis not present

## 2024-02-16 LAB — MYOCARDIAL PERFUSION IMAGING
LV dias vol: 84 mL (ref 46–106)
LV sys vol: 20 mL (ref 3.8–5.2)
Nuc Stress EF: 76 %
Peak HR: 100 {beats}/min
Rest HR: 72 {beats}/min
Rest Nuclear Isotope Dose: 13 mCi
SDS: 0
SRS: 3
SSS: 0
ST Depression (mm): 0 mm
Stress Nuclear Isotope Dose: 37.6 mCi
TID: 0.8

## 2024-02-16 MED ORDER — REGADENOSON 0.4 MG/5ML IV SOLN
INTRAVENOUS | Status: AC
  Filled 2024-02-16: qty 5

## 2024-02-16 MED ORDER — REGADENOSON 0.4 MG/5ML IV SOLN
0.4000 mg | Freq: Once | INTRAVENOUS | Status: AC
  Administered 2024-02-16: 0.4 mg via INTRAVENOUS

## 2024-02-16 MED ORDER — TECHNETIUM TC 99M TETROFOSMIN IV KIT
37.6000 | PACK | Freq: Once | INTRAVENOUS | Status: AC | PRN
  Administered 2024-02-16: 37.6 via INTRAVENOUS

## 2024-02-16 MED ORDER — TECHNETIUM TC 99M TETROFOSMIN IV KIT
13.0000 | PACK | Freq: Once | INTRAVENOUS | Status: AC | PRN
  Administered 2024-02-16: 13 via INTRAVENOUS

## 2024-02-19 ENCOUNTER — Ambulatory Visit: Payer: Self-pay | Admitting: Cardiology

## 2024-03-31 ENCOUNTER — Encounter: Payer: Self-pay | Admitting: Nurse Practitioner

## 2024-03-31 MED ORDER — OZEMPIC (0.25 OR 0.5 MG/DOSE) 2 MG/1.5ML ~~LOC~~ SOPN
0.2500 mg | PEN_INJECTOR | SUBCUTANEOUS | 1 refills | Status: AC
Start: 2024-03-31 — End: ?

## 2024-04-13 ENCOUNTER — Other Ambulatory Visit: Payer: Self-pay | Admitting: Nurse Practitioner

## 2024-04-14 NOTE — Telephone Encounter (Signed)
 Requesting: Atorvastatin  Calcium  80 MG Oral Tablet  Last Visit: 09/30/2023 Next Visit: 04/25/2024 Last Refill: 05/04/2023  Please Advise

## 2024-04-25 ENCOUNTER — Encounter: Payer: Self-pay | Admitting: Nurse Practitioner

## 2024-04-25 ENCOUNTER — Ambulatory Visit: Admitting: Nurse Practitioner

## 2024-04-25 VITALS — BP 130/82 | HR 75 | Temp 96.7°F | Ht 62.0 in | Wt 252.4 lb

## 2024-04-25 DIAGNOSIS — E559 Vitamin D deficiency, unspecified: Secondary | ICD-10-CM

## 2024-04-25 DIAGNOSIS — Z23 Encounter for immunization: Secondary | ICD-10-CM | POA: Diagnosis not present

## 2024-04-25 DIAGNOSIS — Z0000 Encounter for general adult medical examination without abnormal findings: Secondary | ICD-10-CM | POA: Diagnosis not present

## 2024-04-25 DIAGNOSIS — M810 Age-related osteoporosis without current pathological fracture: Secondary | ICD-10-CM

## 2024-04-25 DIAGNOSIS — E782 Mixed hyperlipidemia: Secondary | ICD-10-CM | POA: Diagnosis not present

## 2024-04-25 DIAGNOSIS — Z1159 Encounter for screening for other viral diseases: Secondary | ICD-10-CM | POA: Diagnosis not present

## 2024-04-25 DIAGNOSIS — R7303 Prediabetes: Secondary | ICD-10-CM | POA: Diagnosis not present

## 2024-04-25 DIAGNOSIS — E039 Hypothyroidism, unspecified: Secondary | ICD-10-CM

## 2024-04-25 DIAGNOSIS — I10 Essential (primary) hypertension: Secondary | ICD-10-CM

## 2024-04-25 DIAGNOSIS — G4700 Insomnia, unspecified: Secondary | ICD-10-CM | POA: Diagnosis not present

## 2024-04-25 DIAGNOSIS — F419 Anxiety disorder, unspecified: Secondary | ICD-10-CM

## 2024-04-25 LAB — COMPREHENSIVE METABOLIC PANEL WITH GFR
ALT: 16 U/L (ref 0–35)
AST: 15 U/L (ref 0–37)
Albumin: 4.1 g/dL (ref 3.5–5.2)
Alkaline Phosphatase: 83 U/L (ref 39–117)
BUN: 12 mg/dL (ref 6–23)
CO2: 26 meq/L (ref 19–32)
Calcium: 9.2 mg/dL (ref 8.4–10.5)
Chloride: 106 meq/L (ref 96–112)
Creatinine, Ser: 0.59 mg/dL (ref 0.40–1.20)
GFR: 88.17 mL/min (ref >60.00)
Glucose, Bld: 108 mg/dL — ABNORMAL HIGH (ref 70–99)
Potassium: 4 meq/L (ref 3.5–5.1)
Sodium: 141 meq/L (ref 135–145)
Total Bilirubin: 1.6 mg/dL — ABNORMAL HIGH (ref 0.2–1.2)
Total Protein: 6.8 g/dL (ref 6.0–8.3)

## 2024-04-25 LAB — CBC WITH DIFFERENTIAL/PLATELET
Basophils Absolute: 0 K/uL (ref 0.0–0.1)
Basophils Relative: 0.4 % (ref 0.0–3.0)
Eosinophils Absolute: 0.1 K/uL (ref 0.0–0.7)
Eosinophils Relative: 1.9 % (ref 0.0–5.0)
HCT: 40.8 % (ref 36.0–46.0)
Hemoglobin: 13.6 g/dL (ref 12.0–15.0)
Lymphocytes Relative: 34 % (ref 12.0–46.0)
Lymphs Abs: 1.5 K/uL (ref 0.7–4.0)
MCHC: 33.4 g/dL (ref 30.0–36.0)
MCV: 93.3 fl (ref 78.0–100.0)
Monocytes Absolute: 0.2 K/uL (ref 0.1–1.0)
Monocytes Relative: 5.5 % (ref 3.0–12.0)
Neutro Abs: 2.6 K/uL (ref 1.4–7.7)
Neutrophils Relative %: 58.2 % (ref 43.0–77.0)
Platelets: 204 K/uL (ref 150.0–400.0)
RBC: 4.37 Mil/uL (ref 3.87–5.11)
RDW: 14.4 % (ref 11.5–15.5)
WBC: 4.4 K/uL (ref 4.0–10.5)

## 2024-04-25 LAB — LIPID PANEL
Cholesterol: 106 mg/dL (ref 0–200)
HDL: 57.2 mg/dL (ref >39.00)
LDL Cholesterol: 33 mg/dL (ref 0–99)
NonHDL: 48.39
Total CHOL/HDL Ratio: 2
Triglycerides: 75 mg/dL (ref 0.0–149.0)
VLDL: 15 mg/dL (ref 0.0–40.0)

## 2024-04-25 LAB — HEMOGLOBIN A1C: Hgb A1c MFr Bld: 6 % (ref 4.6–6.5)

## 2024-04-25 LAB — VITAMIN D 25 HYDROXY (VIT D DEFICIENCY, FRACTURES): VITD: 21.4 ng/mL — ABNORMAL LOW (ref 30.00–100.00)

## 2024-04-25 LAB — TSH: TSH: 0.33 u[IU]/mL — ABNORMAL LOW (ref 0.35–5.50)

## 2024-04-25 MED ORDER — DOXEPIN HCL 10 MG PO CAPS: 10.0000 mg | ORAL_CAPSULE | Freq: Every evening | ORAL | 0 refills | Status: AC | PRN

## 2024-04-25 MED ORDER — BUSPIRONE HCL 5 MG PO TABS: 5.0000 mg | ORAL_TABLET | Freq: Two times a day (BID) | ORAL | 1 refills | Status: AC

## 2024-04-25 NOTE — Assessment & Plan Note (Signed)
 She is taking vitamin D  2,000 units daily. Check vitamin D  today and adjust regimen based on results.

## 2024-04-25 NOTE — Assessment & Plan Note (Signed)
Chronic, stable.  Continue metoprolol 50 mg daily and losartan 50 mg daily.  Check CMP, CBC today.  Follow-up in 6 months.

## 2024-04-25 NOTE — Assessment & Plan Note (Signed)
 Chronic, not controlled. She has insomnia along with anxiety. Will restart buspar  5mg  BID and start doxepin  10mg  at bedtime. Follow-up in 3 months or sooner with concerns.

## 2024-04-25 NOTE — Assessment & Plan Note (Signed)
 Health maintenance reviewed and updated. Discussed nutrition, exercise. Follow-up 1 year.

## 2024-04-25 NOTE — Assessment & Plan Note (Signed)
 Chronic, ongoing. Re-start buspar  5mg  BID prn anxiety.

## 2024-04-25 NOTE — Assessment & Plan Note (Signed)
 DEXA scan in 2014 showed a T score of -3.3.  She is not currently interested in repeating this or starting a medication.  Continue taking calcium and vitamin d supplement. Check vitamin D today.

## 2024-04-25 NOTE — Progress Notes (Signed)
 BP 130/82 (BP Location: Left Wrist, Patient Position: Sitting, Cuff Size: Large)   Pulse 75   Temp (!) 96.7 F (35.9 C)   Ht 5' 2 (1.575 m)   Wt 252 lb 6.4 oz (114.5 kg)   SpO2 100%   BMI 46.16 kg/m    Subjective:    Patient ID: Barnie VEAR Bers, female    DOB: February 07, 1951, 73 y.o.   MRN: [id number]  CC: Chief Complaint  Patient presents with   Annual Exam    With fasting labs, concerns with insomnia. Flu Vaccine    HPI: UBAH RADKE is a 73 y.o. female presenting on 04/25/2024 for comprehensive medical examination. Current medical complaints include:insomnia  She has been having trouble with insomnia for the last 2  months. She will have trouble falling asleep and doesn't sleep until 4am. She notes some trouble with anxiety as well. She denies panic attacks. She has tried melatonin and zquil.   She currently lives with: alone Menopausal Symptoms: no  Depression and Anxiety Screen done today and results listed below:     04/25/2024    8:48 AM 09/30/2023    1:37 PM 05/15/2023    1:47 PM 11/24/2022    4:00 PM 11/06/2022    1:44 PM  Depression screen PHQ 2/9  Decreased Interest 0 0 0 0 0  Down, Depressed, Hopeless 0 0 0 0 0  PHQ - 2 Score 0 0 0 0 0  Altered sleeping 3      Tired, decreased energy 0      Change in appetite 0      Feeling bad or failure about yourself  0      Trouble concentrating 0      Moving slowly or fidgety/restless 0      Suicidal thoughts 0      PHQ-9 Score 3      Difficult doing work/chores Very difficult          04/25/2024    8:48 AM 04/12/2022   12:22 PM  GAD 7 : Generalized Anxiety Score  Nervous, Anxious, on Edge 0 0  Control/stop worrying 0 0  Worry too much - different things 1 0  Trouble relaxing 0 0  Restless 0 0  Easily annoyed or irritable 0 0  Afraid - awful might happen 0 0  Total GAD 7 Score 1 0  Anxiety Difficulty Not difficult at all Not difficult at all    The patient has a history of falls. I did complete a  risk assessment for falls. A plan of care for falls was documented.   Past Medical History:  Past Medical History:  Diagnosis Date   Anosmia 10/30/2015   Atypical facial pain 10/30/2015   Left upper face   Chronic pain syndrome 07/09/2017   Coronary artery disease    Depression    Dyspnea on exertion 09/20/2015   Gastro-esophageal reflux    GERD (gastroesophageal reflux disease) 05/25/2012   Hyperlipidemia    Hypertension    Hypothyroidism 05/25/2012   Last Assessment & Plan:  States was started after a partial thyroidectomy but does not recall any abnormal lab value and no symptoms.  Discussed as is on low dose, would be reasonable to discontinue and recheck in 6-8 weeks which she would like to do. Last Assessment & Plan:  States was started after a partial thyroidectomy but does not recall any abnormal lab value and no symptoms.  Discussed as    Lumbar spondylosis 07/09/2017  Memory disorder 10/30/2015   Obesity, morbid, BMI 40.0-49.9 (HCC) 06/02/2014   Osteoporosis 05/02/2013   Overview:  DEXA 11/14 DEXA 11/14   Prediabetes    Restless legs syndrome 05/25/2012   Last Assessment & Plan:  patient reports using clonazepam  about once weekly .  Discussed nonpharmacologic tips for treating RLS.  States other medicatiosn she had treid had not been effective (does not remember name) Will refill for now.  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   URI, acute 09/20/2015   Vitamin B 12 deficiency    Vitamin D  deficiency 05/25/2012    Surgical History:  Past Surgical History:  Procedure Laterality Date   ABDOMINAL HYSTERECTOMY     APPENDECTOMY     CARDIAC CATHETERIZATION  12/23/2019   CATARACT EXTRACTION     CHOLECYSTECTOMY     CORONARY STENT INTERVENTION N/A 12/23/2019   Procedure: CORONARY STENT INTERVENTION;  Surgeon: Claudene Victory ORN, MD;  Location: [facility];   Service: Cardiovascular;  Laterality: N/A;   JOINT REPLACEMENT     2 total knee replacement   LASIK     LEFT HEART CATH AND CORONARY ANGIOGRAPHY N/A 12/23/2019   Procedure: LEFT HEART CATH AND CORONARY ANGIOGRAPHY;  Surgeon: Claudene Victory ORN, MD;  Location: [facility];  Service: Cardiovascular;  Laterality: N/A;   REVERSE SHOULDER ARTHROPLASTY Right 04/03/2020   Procedure: REVERSE SHOULDER ARTHROPLASTY;  Surgeon: Sharl Selinda Dover, MD;  Location: [facility];  Service: Orthopedics;  Laterality: Right;  2.5 hrs   SHOULDER ARTHROSCOPY WITH ROTATOR CUFF REPAIR AND SUBACROMIAL DECOMPRESSION Left 10/25/2020   Procedure: Left shoulder arthroscopic, extensive debridement, subacromial decompression;  Surgeon: Sharl Selinda Dover, MD;  Location: [facility];  Service: Orthopedics;  Laterality: Left;  90 mins   THYROIDECTOMY     TONSILLECTOMY      Medications:  Current Outpatient Medications on File Prior to Visit  Medication Sig   aspirin  81 MG EC tablet Take 81 mg by mouth daily.   atorvastatin  (LIPITOR ) 80 MG tablet Take 1 tablet by mouth once daily   Cholecalciferol (VITAMIN D -3 PO) Take 1 tablet by mouth daily.   Cholecalciferol (VITAMIN D3 PO) Take by mouth daily.   HYDROcodone -acetaminophen  (NORCO) 10-325 MG tablet Take 1 tablet by mouth 4 (four) times daily as needed.   losartan  (COZAAR ) 50 MG tablet Take 1 tablet (50 mg total) by mouth daily.   metoprolol  succinate (TOPROL -XL) 50 MG 24 hr tablet TAKE 1 TABLET(50 MG) BY MOUTH DAILY   Multiple Vitamins-Minerals (CENTRUM SILVER 50+WOMEN) TABS Take 1 tablet by mouth daily.   nitroGLYCERIN  (NITROSTAT ) 0.4 MG SL tablet Place 1 tablet (0.4 mg total) under the tongue every 5 (five) minutes as needed for chest pain.   pantoprazole  (PROTONIX ) 40 MG tablet TAKE 1 TABLET(40 MG) BY MOUTH DAILY   Semaglutide ,0.25 or 0.5MG /DOS, (OZEMPIC , 0.25 OR 0.5 MG/DOSE,) 2 MG/1.5ML SOPN Inject 0.25 mg into the skin once a week. Start with 0.25MG  once a week x 4 weeks,  then increase to 0.5MG  weekly.   ketoconazole  (NIZORAL ) 2 % cream Apply 1 Application topically daily. (Patient not taking: Reported on 04/25/2024)   ondansetron  (ZOFRAN  ODT) 4 MG disintegrating tablet Take 1 tablet (4 mg total) by mouth every 8 (eight) hours as needed. (Patient not taking: Reported on 04/25/2024)   No current facility-administered medications on file prior to visit.  Allergies:  Allergies  Allergen Reactions   Lidocaine  Other (See Comments)    lidocaine     Social History:  Social History   Socioeconomic History   Marital status: Divorced    Spouse name: Not on file   Number of children: 1   Years of education: Not on file   Highest education level: Not on file  Occupational History   Not on file  Tobacco Use   Smoking status: Former    Current packs/day: 0.00    Types: Cigarettes    Quit date: 02/17/1987    Years since quitting: 37.2   Smokeless tobacco: Never  Vaping Use   Vaping status: Never Used  Substance and Sexual Activity   Alcohol use: No   Drug use: No   Sexual activity: Not Currently    Birth control/protection: Post-menopausal, Surgical    Comment: Hysterectomy  Other Topics Concern   Not on file  Social History Narrative      Social Drivers of Health   Financial Resource Strain: Low Risk  (11/24/2022)   Overall Financial Resource Strain (CARDIA)    Difficulty of Paying Living Expenses: Not hard at all  Food Insecurity: No Food Insecurity (11/24/2022)   Hunger Vital Sign    Worried About Running Out of Food in the Last Year: Never true    Ran Out of Food in the Last Year: Never true  Transportation Needs: No Transportation Needs (11/24/2022)   PRAPARE - Administrator, Civil Service (Medical): No    Lack of Transportation (Non-Medical): No  Physical Activity: Inactive (11/24/2022)   Exercise Vital Sign    Days of Exercise per Week: 0 days    Minutes of Exercise per Session: 0 min  Stress: No Stress Concern Present  (11/24/2022)   [employer] of Occupational [employer] - Occupational Stress Questionnaire    Feeling of Stress : Only a little  Social Connections: Not on file  Intimate Partner Violence: Not on file   Social History   Tobacco Use  Smoking Status Former   Current packs/day: 0.00   Types: Cigarettes   Quit date: 02/17/1987   Years since quitting: 37.2  Smokeless Tobacco Never   Social History   Substance and Sexual Activity  Alcohol Use No    Family History:  Family History  Problem Relation Age of Onset   Diabetes Mother    Cancer Mother        lung   Hypertension Mother    Cancer Father        lung   Diabetes Sister    Heart disease Brother    Diabetes Brother    Diabetes Brother    Breast cancer Neg Hx     Past medical history, surgical history, medications, allergies, family history and social history reviewed with patient today and changes made to appropriate areas of the chart.   Review of Systems  Constitutional:  Positive for malaise/fatigue. Negative for fever.  HENT: Negative.    Eyes: Negative.   Respiratory: Negative.    Cardiovascular: Negative.   Gastrointestinal:  Positive for constipation. Negative for abdominal pain, diarrhea, nausea and vomiting.  Genitourinary: Negative.   Musculoskeletal: Negative.   Skin: Negative.   Neurological: Negative.   Endo/Heme/Allergies:  Bruises/bleeds easily.  Psychiatric/Behavioral:  Negative for depression. The patient is nervous/anxious and has insomnia.    All other ROS negative except what is listed above and in the HPI.      Objective:    BP  130/82 (BP Location: Left Wrist, Patient Position: Sitting, Cuff Size: Large)   Pulse 75   Temp (!) 96.7 F (35.9 C)   Ht 5' 2 (1.575 m)   Wt 252 lb 6.4 oz (114.5 kg)   SpO2 100%   BMI 46.16 kg/m   Wt Readings from Last 3 Encounters:  04/25/24 252 lb 6.4 oz (114.5 kg)  02/02/24 252 lb 12.8 oz (114.7 kg)  09/30/23 250 lb 12.8 oz (113.8 kg)    Physical  Exam Vitals and nursing note reviewed.  Constitutional:      General: She is not in acute distress.    Appearance: Normal appearance.  HENT:     Head: Normocephalic and atraumatic.     Right Ear: Tympanic membrane, ear canal and external ear normal.     Left Ear: Tympanic membrane, ear canal and external ear normal.  Eyes:     Conjunctiva/sclera: Conjunctivae normal.  Cardiovascular:     Rate and Rhythm: Normal rate and regular rhythm.     Pulses: Normal pulses.     Heart sounds: Normal heart sounds.  Pulmonary:     Effort: Pulmonary effort is normal.     Breath sounds: Normal breath sounds.  Abdominal:     Palpations: Abdomen is soft.     Tenderness: There is no abdominal tenderness.  Musculoskeletal:        General: Normal range of motion.     Cervical back: Normal range of motion and neck supple.     Right lower leg: No edema.     Left lower leg: No edema.     Comments: Generalized weakness  Lymphadenopathy:     Cervical: No cervical adenopathy.  Skin:    General: Skin is warm and dry.     Findings: Bruising (right forearm) present.  Neurological:     General: No focal deficit present.     Mental Status: She is alert and oriented to person, place, and time.     Cranial Nerves: No cranial nerve deficit.     Coordination: Coordination normal.     Gait: Gait normal.  Psychiatric:        Mood and Affect: Mood normal.        Behavior: Behavior normal.        Thought Content: Thought content normal.        Judgment: Judgment normal.     Results for orders placed or performed in visit on 02/02/24  MYOCARDIAL PERFUSION IMAGING   Collection Time: 02/16/24  2:17 PM  Result Value Ref Range   Rest Nuclear Isotope Dose 13.0 mCi   Stress Nuclear Isotope Dose 37.6 mCi   Rest HR 72.0 bpm   Rest BP 90/60 mmHg   Peak HR 100 bpm   Peak BP 128/70 mmHg   SSS 0.0    SRS 3.0    SDS 0.0    TID 0.80    Nuc Stress EF 76 %   LV sys vol 20.0 3.8 - 5.2 mL   LV dias vol 84.0 46 -  106 mL   ST Depression (mm) 0 mm      Assessment & Plan:   Problem List Items Addressed This Visit       Cardiovascular and Mediastinum   Essential hypertension   Chronic, stable.  Continue metoprolol  50 mg daily and losartan  50 mg daily.  Check CMP, CBC today.  Follow-up in 6 months.      Relevant Orders   CBC with Differential/Platelet  Comprehensive metabolic panel with GFR     Endocrine   Hypothyroidism   Check TSH today. She had a partial thyroidectomy and is not currently taking any medication.       Relevant Orders   TSH     Musculoskeletal and Integument   Osteoporosis   DEXA scan in 2014 showed a T score of -3.3.  She is not currently interested in repeating this or starting a medication.  Continue taking calcium  and vitamin d  supplement. Check vitamin D  today.       Relevant Medications   Cholecalciferol (VITAMIN D3 PO)   Other Relevant Orders   VITAMIN D  25 Hydroxy (Vit-D Deficiency, Fractures)     Other   Hyperlipidemia   Chronic, stable.  Continue atorvastatin  80 mg daily.  Check CMP, CBC, lipid panel today.      Relevant Orders   CBC with Differential/Platelet   Comprehensive metabolic panel with GFR   Lipid panel   Morbid obesity (HCC)   BMI 46.1. Discussed nutrition, exercise. Continue semaglutide  to help with weight loss.       Vitamin D  deficiency   She is taking vitamin D  2,000 units daily. Check vitamin D  today and adjust regimen based on results.       Relevant Orders   VITAMIN D  25 Hydroxy (Vit-D Deficiency, Fractures)   Prediabetes   Chronic, stable.  Continue ozempic  0.25mg  weekly for 1 more week, then increase to 0.5mg  weekly. Continue focus on nutrition and exercise.  Check A1c today.      Relevant Orders   Hemoglobin A1c   Anxiety   Chronic, ongoing. Re-start buspar  5mg  BID prn anxiety.       Relevant Medications   busPIRone  (BUSPAR ) 5 MG tablet   doxepin  (SINEQUAN ) 10 MG capsule   Insomnia   Chronic, not controlled. She  has insomnia along with anxiety. Will restart buspar  5mg  BID and start doxepin  10mg  at bedtime. Follow-up in 3 months or sooner with concerns.       Routine general medical examination at a health care facility - Primary   Health maintenance reviewed and updated. Discussed nutrition, exercise. Follow-up 1 year.        Other Visit Diagnoses       Encounter for hepatitis C screening test for low risk patient       Screen hepatitis C today   Relevant Orders   Hepatitis C antibody     Immunization due       Flu vaccine given today   Relevant Orders   Flu vaccine HIGH DOSE PF(Fluzone Trivalent) (Completed)        Follow up plan: Return in about 3 months (around 07/26/2024) for follow-up .   LABORATORY TESTING:  - Pap smear: not applicable  IMMUNIZATIONS:   - Tdap: Tetanus vaccination status reviewed: last tetanus booster within 10 years. - Influenza: Administered today - Pneumovax: Up to date - Prevnar: Up to date - HPV: Not applicable - Shingrix vaccine: Declined  SCREENING: -Mammogram: Declined  - Colonoscopy: Not applicable  - Bone Density: Declined   PATIENT COUNSELING:   Advised to take 1 mg of folate supplement per day if capable of pregnancy.   Sexuality: Discussed sexually transmitted diseases, partner selection, use of condoms, avoidance of unintended pregnancy  and contraceptive alternatives.   Advised to avoid cigarette smoking.  I discussed with the patient that most people either abstain from alcohol or drink within safe limits (<=14/week and <=4 drinks/occasion for males, <=7/weeks and <=  3 drinks/occasion for females) and that the risk for alcohol disorders and other health effects rises proportionally with the number of drinks per week and how often a drinker exceeds daily limits.  Discussed cessation/primary prevention of drug use and availability of treatment for abuse.   Diet: Encouraged to adjust caloric intake to maintain  or achieve ideal body  weight, to reduce intake of dietary saturated fat and total fat, to limit sodium intake by avoiding high sodium foods and not adding table salt, and to maintain adequate dietary potassium and calcium  preferably from fresh fruits, vegetables, and low-fat dairy products.    stressed the importance of regular exercise  Injury prevention: Discussed safety belts, safety helmets, smoke detector, smoking near bedding or upholstery.   Dental health: Discussed importance of regular tooth brushing, flossing, and dental visits.    NEXT PREVENTATIVE PHYSICAL DUE IN 1 YEAR. Return in about 3 months (around 07/26/2024) for follow-up .  Kayin Osment A Santo Zahradnik

## 2024-04-25 NOTE — Assessment & Plan Note (Signed)
 Chronic, stable.  Continue ozempic  0.25mg  weekly for 1 more week, then increase to 0.5mg  weekly. Continue focus on nutrition and exercise.  Check A1c today.

## 2024-04-25 NOTE — Patient Instructions (Signed)
 It was great to see you!  We are checking your labs today and will let you know the results via mychart/phone.   Start moisturizing eye drop or liquid tears twice a day   Re-start buspar  1 tablet twice a day as needed for anxiety   Let's follow-up in 3 months, sooner if you have concerns.  If a referral was placed today, you will be contacted for an appointment. Please note that routine referrals can sometimes take up to 3-4 weeks to process. Please call our office if you haven't heard anything after this time frame.  Take care,  Tinnie Harada, NP

## 2024-04-25 NOTE — Assessment & Plan Note (Signed)
 BMI 46.1. Discussed nutrition, exercise. Continue semaglutide  to help with weight loss.

## 2024-04-25 NOTE — Assessment & Plan Note (Signed)
Check TSH today. She had a partial thyroidectomy and is not currently taking any medication.

## 2024-04-25 NOTE — Assessment & Plan Note (Signed)
Chronic, stable.  Continue atorvastatin 80 mg daily.  Check CMP, CBC, lipid panel today.

## 2024-04-26 ENCOUNTER — Ambulatory Visit: Payer: Self-pay | Admitting: Nurse Practitioner

## 2024-04-26 LAB — HEPATITIS C ANTIBODY: Hepatitis C Ab: NONREACTIVE

## 2024-04-26 MED ORDER — VITAMIN D (ERGOCALCIFEROL) 1.25 MG (50000 UNIT) PO CAPS: 50000.0000 [IU] | ORAL_CAPSULE | ORAL | 0 refills | Status: AC

## 2024-05-06 ENCOUNTER — Other Ambulatory Visit: Payer: Self-pay | Admitting: Nurse Practitioner

## 2024-05-06 NOTE — Telephone Encounter (Signed)
 Requesting: Metoprolol  Succinate ER 50 MG Oral Tablet Extended Release 24 Hour  Last Visit: 04/25/2024 Next Visit: Visit date not found Last Refill: 04/13/2023  Please Advise

## 2024-05-18 NOTE — Progress Notes (Deleted)
 Cardiology Office Note:    Date:  05/18/2024   ID:  [name], DOB 1950/11/25, MRN [id number]  PCP:  Nedra Tinnie LABOR, NP   [facility] Providers Cardiologist:  Victory LELON Claudene DOUGLAS, MD (Inactive)     Referring MD: Nedra Tinnie LABOR, NP   No chief complaint on file.   History of Present Illness:    [name] is a 73 y.o. female seen for follow up CAD. Formerly seen by Dr Victory Claudene. She has a history of hypertension, obesity, hyperlipidemia, DM II, July 2021 unstable angina treated with DES of the first diagonal with good result.  Intolerant of Brilinta  due to dyspnea.   When last seen she had some atypical symptoms. Myoview  was done and was normal.  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  Discussed as is on low dose, would be reasonable to discontinue and recheck in 6-8 weeks which she would like to do. Last Assessment & Plan:  States was started after a partial thyroidectomy but does not recall any abnormal lab value and no symptoms.  Discussed as    Lumbar spondylosis 07/09/2017   Memory disorder 10/30/2015   Obesity, morbid, BMI 40.0-49.9 (HCC) 06/02/2014   Osteoporosis 05/02/2013   Overview:  DEXA 11/14 DEXA 11/14   Prediabetes    Restless legs syndrome 05/25/2012   Last Assessment & Plan:  patient reports using clonazepam  about once weekly .  Discussed nonpharmacologic tips for treating RLS.  States other medicatiosn she had treid had not been effective (does not remember name) Will refill  for now.  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   URI, acute 09/20/2015   Vitamin B 12 deficiency    Vitamin D  deficiency 05/25/2012    Past Surgical History:  Procedure Laterality Date   ABDOMINAL HYSTERECTOMY     APPENDECTOMY     CARDIAC CATHETERIZATION  12/23/2019   CATARACT EXTRACTION     CHOLECYSTECTOMY     CORONARY STENT INTERVENTION N/A 12/23/2019   Procedure: CORONARY STENT INTERVENTION;  Surgeon: Claudene Victory LELON, MD;  Location: [facility];  Service: Cardiovascular;  Laterality: N/A;   JOINT REPLACEMENT     2 total knee replacement   LASIK     LEFT HEART CATH AND CORONARY ANGIOGRAPHY N/A 12/23/2019   Procedure: LEFT HEART CATH AND CORONARY ANGIOGRAPHY;  Surgeon: Claudene Victory LELON, MD;  Location: [facility];  Service: Cardiovascular;  Laterality: N/A;   REVERSE SHOULDER ARTHROPLASTY Right 04/03/2020   Procedure: REVERSE SHOULDER ARTHROPLASTY;  Surgeon: Sharl Selinda Dover, MD;  Location: [facility];  Service: Orthopedics;  Laterality: Right;  2.5 hrs   SHOULDER ARTHROSCOPY WITH ROTATOR CUFF REPAIR AND SUBACROMIAL DECOMPRESSION Left 10/25/2020   Procedure: Left shoulder arthroscopic, extensive debridement, subacromial decompression;  Surgeon: Sharl Selinda Dover, MD;  Location: [facility];  Service: Orthopedics;  Laterality: Left;  90 mins   THYROIDECTOMY     TONSILLECTOMY      Current Medications: No outpatient medications have been marked as taking for the 05/24/24 encounter (Appointment) with Evalene Vath M, MD.     Allergies:   Lidocaine    Social History   Socioeconomic History   Marital status: Divorced    Spouse name: Not on file   Number of children: 1   Years of education: Not on file   Highest education level: Not on file  Occupational History   Not on file  Tobacco Use   Smoking status: Former    Current packs/day: 0.00    Types: Cigarettes     Quit date: 02/17/1987    Years since quitting: 37.2   Smokeless tobacco: Never  Vaping Use   Vaping status: Never Used  Substance and Sexual Activity   Alcohol use: No   Drug use: No   Sexual activity: Not Currently    Birth control/protection: Post-menopausal, Surgical    Comment: Hysterectomy  Other Topics Concern   Not on file  Social History Narrative      Social Drivers of Health   Financial Resource Strain: Low Risk  (11/24/2022)   Overall Financial Resource Strain (CARDIA)    Difficulty of Paying Living Expenses: Not hard at all  Food Insecurity: No Food Insecurity (11/24/2022)   Hunger Vital Sign    Worried About Running Out of Food in the Last Year: Never true    Ran Out of Food in the Last Year: Never true  Transportation Needs: No Transportation Needs (11/24/2022)   PRAPARE - Administrator, Civil Service (Medical): No    Lack of Transportation (Non-Medical): No  Physical Activity: Inactive (11/24/2022)   Exercise Vital Sign    Days of Exercise per Week: 0 days    Minutes of Exercise per Session: 0 min  Stress: No Stress Concern Present (11/24/2022)   [employer] of Occupational [employer] - Occupational Stress Questionnaire    Feeling of Stress : Only a little  Social Connections: Not on file     Family History: The patient's family history includes Cancer in her father and mother; Diabetes in her brother, brother, mother, and sister; Heart disease in her brother; Hypertension in her mother. There is no history of Breast cancer.  ROS:   Please see the history of present illness.     All other systems reviewed and are negative.  EKGs/Labs/Other Studies Reviewed:    Cardiac cath 12/23/19:  CORONARY STENT INTERVENTION  LEFT HEART CATH AND CORONARY ANGIOGRAPHY   Conclusion  2-week history of progressive new onset angina. 99% large first diagonal treated with 2.75 x 15 Onyx postdilated to 3.0 mm and 0% stenosis. Left main is widely patent LAD contains  mid eccentric 50% stenosis. Circumflex gives 1 large tortuous obtuse marginal and is free of disease. Dominant with luminal irregularities in the mid body.  No significant obstruction. Very mild mid anterior wall hypokinesis.  EF 50 to 55% which is mildly reduced for female.  LVEDP is 19 mmHg.   RECOMMENDATIONS:   Dual antiplatelet therapy for at least 6 months.  Initially aspirin  and Brilinta .  After 4 to 6 weeks Brilinta  could be decreased in intensity to clopidogrel  plus aspirin . High intensity statin therapy added. Low-dose beta-blocker therapy added. Overnight observation since we have both radial and femoral access during the procedure.re reviewed today:   Echo 06/11/21:  IMPRESSIONS     1. Left ventricular ejection fraction, by  estimation, is 65 to 70%. The  left ventricle has normal function. The left ventricle has no regional  wall motion abnormalities. Left ventricular diastolic parameters were  normal.   2. Right ventricular systolic function is normal. The right ventricular  size is normal. Tricuspid regurgitation signal is inadequate for assessing  PA pressure.   3. The mitral valve is grossly normal. No evidence of mitral valve  regurgitation. No evidence of mitral stenosis.   4. The aortic valve is grossly normal. Aortic valve regurgitation is not  visualized. No aortic stenosis is present.   5. The inferior vena cava is normal in size with <50% respiratory  variability, suggesting right atrial pressure of 8 mmHg.   Comparison(s): No prior Echocardiogram.    MYOCARDIAL IMAGING WITH SPECT (REST AND PHARMACOLOGIC-STRESS - 2 DAY PROTOCOL)   GATED LEFT VENTRICULAR WALL MOTION STUDY   LEFT VENTRICULAR EJECTION FRACTION   TECHNIQUE: Standard myocardial SPECT imaging was performed after resting intravenous injection of 30 mCi Tc-63m tetrofosmin . Subsequently, on a second day, intravenous infusion of Lexiscan  was performed under the supervision of the Cardiology  staff. At peak effect of the drug, 30 mCi Tc-70m tetrofosmin  was injected intravenously and standard myocardial SPECT imaging was performed. Quantitative gated imaging was also performed to evaluate left ventricular wall motion, and estimate left ventricular ejection fraction.   COMPARISON:  Chest radiograph 06/10/2021   FINDINGS: Perfusion: There is moderate decreased perfusion to the anterior and anterolateral wall involving the apical and mid segment. This decreased perfusion is more prominent on the rest imaging than the stress imaging. Findings indicate a prior scar/infarction.   No evidence reversible ischemia.   Wall Motion: Normal left ventricular wall motion. No left ventricular dilation.   Left Ventricular Ejection Fraction: Greater than 70 %   End diastolic volume 67 ml   End systolic volume 19 ml   IMPRESSION: 1. No reversible ischemia.  Suspect anterolateral infarct.   2. Normal left ventricular wall motion.   3. Left ventricular ejection fraction greater than 70%   4. Non invasive risk stratification*: Low   *2012 Appropriate Use Criteria for Coronary Revascularization Focused Update: J Am Coll Cardiol. 2012;59(9):857-881. http://content.dementiazones.com.aspx?articleid=1201161     Electronically Signed   By: Jackquline Boxer M.D.   On: 06/12/2021 10:04   Monitor 03/14/22: Study Highlights      NSR with average HR 67 bpm   PVC burden 1.3%   Moderate correlation between PVC's and diary complaints   No atrial fibrillation / flutter etc.     Patch Wear Time:  12 days and 23 hours (2023-09-02T11:44:05-399 to 2023-09-15T10:55:58-0400)   Patient had a min HR of 49 bpm, max HR of 129 bpm, and avg HR of 67 bpm. Predominant underlying rhythm was Sinus Rhythm. 1 run of Supraventricular Tachycardia occurred lasting 5 beats with a max rate of 129 bpm (avg 109 bpm). Isolated SVEs were rare  (<1.0%), SVE Couplets were rare (<1.0%), and SVE Triplets were  rare (<1.0%). Isolated VEs were occasional (1.2%, 14303), VE Couplets were rare (<1.0%, 25), and no VE Triplets were present. Ventricular Bigeminy and Trigeminy were present.    Myoview  02/16/24: Study Highlights Show Result Comparison    The study is normal. The study is low risk.   No ST deviation was noted. The ECG was not diagnostic due to pharmacologic protocol.   LV perfusion is normal.   Left ventricular function is normal. End diastolic cavity size is normal. End systolic cavity size is normal.   CT  images were obtained for attenuation correction and were examined for the presence of coronary calcium  when appropriate.   Coronary calcium  was present on the attenuation correction CT images. Moderate coronary calcifications were present. Coronary calcifications were present in the left anterior descending artery distribution(s).   Prior study available for comparison from 06/12/2021.   ECG rhythm shows normal sinus rhythm.   Normal perfusion. LVEF 76% with normal wall motion. Calcium /stent is noted in the LAD artery. This is a low risk study. Compared to a prior study in 2022, there are no perfusion defects noted in this study.        Recent Labs: 04/25/2024: ALT 16; BUN 12; Creatinine, Ser 0.59; Hemoglobin 13.6; Platelets 204.0; Potassium 4.0; Sodium 141; TSH 0.33  Recent Lipid Panel    Component Value Date/Time   CHOL 106 04/25/2024 0943   CHOL 135 11/14/2021 0955   TRIG 75.0 04/25/2024 0943   HDL 57.20 04/25/2024 0943   HDL 70 11/14/2021 0955   CHOLHDL 2 04/25/2024 0943   VLDL 15.0 04/25/2024 0943   LDLCALC 33 04/25/2024 0943   LDLCALC 46 11/14/2021 0955     Risk Assessment/Calculations:      No BP recorded.  {Refresh Note OR Click here to enter BP  :1}***         Physical Exam:    VS:  There were no vitals taken for this visit.    Wt Readings from Last 3 Encounters:  04/25/24 252 lb 6.4 oz (114.5 kg)  02/02/24 252 lb 12.8 oz (114.7 kg)  09/30/23 250 lb 12.8 oz  (113.8 kg)     GEN:  Well nourished, morbidly obese in no acute distress HEENT: Normal NECK: No JVD; No carotid bruits LYMPHATICS: No lymphadenopathy CARDIAC: RRR, no murmurs, rubs, gallops RESPIRATORY:  Clear to auscultation without rales, wheezing or rhonchi  ABDOMEN: Soft, non-tender, non-distended MUSCULOSKELETAL:  No edema; No deformity  SKIN: Warm and dry NEUROLOGIC:  Alert and oriented x 3 PSYCHIATRIC:  Normal affect   ASSESSMENT:    No diagnosis found.   PLAN:    In order of problems listed above:  CAD s/p stenting of the first diagonal in July 2021. Normal Myoview  Dec 2022. She is having some feelings in her chest that she thinks is like when she had her stent. The record shows she was having chest pressure at that time. At any rate will evaluate for ischemia with a Myoview  study which we can compare with 2022.  HTN well continue Toprol  and losartan  HLD. LDL is at goal 45 on statin DM on Ozempic . A1c 6.0% Morbid obesity. Encourage increased aerobic activity and healthy diet. History of PVCs. Wore event monitor in 2017, Feb 2023 and again in Sept 2023. No afib. Recommend reduction in caffeine intake. Continue metoprolol        Follow up 6 months   Medication Adjustments/Labs and Tests Ordered: Current medicines are reviewed at length with the patient today.  Concerns regarding medicines are outlined above.  No orders of the defined types were placed in this encounter.  No orders of the defined types were placed in this encounter.   There are no Patient Instructions on file for this visit.   Signed, Kailyn Dubie, MD  05/18/2024 7:57 AM    [facility]

## 2024-05-24 ENCOUNTER — Ambulatory Visit: Admitting: Cardiology

## 2024-06-08 ENCOUNTER — Other Ambulatory Visit: Payer: Self-pay | Admitting: Nurse Practitioner

## 2024-06-08 MED ORDER — OZEMPIC (0.25 OR 0.5 MG/DOSE) 2 MG/1.5ML ~~LOC~~ SOPN: 0.2500 mg | PEN_INJECTOR | SUBCUTANEOUS | 1 refills | Status: DC

## 2024-06-08 NOTE — Telephone Encounter (Signed)
 Copied from CRM #[id number]. Topic: Clinical - Medication Refill >> Jun 08, 2024  8:59 AM Deleta S wrote: Medication: Semaglutide ,0.25 or 0.5MG /DOS, (OZEMPIC , 0.25 OR 0.5 MG/DOSE,) 2 MG/1.5ML SOPN  Has the patient contacted their pharmacy? Yes (Agent: If no, request that the patient contact the pharmacy for the refill. If patient does not wish to contact the pharmacy document the reason why and proceed with request.) (Agent: If yes, when and what did the pharmacy advise?)  This is the patient's preferred pharmacy:  [facility] [address] [place], [place] - [address] [place] [address] Phone: [phone] Fax: [phone]  Is this the correct pharmacy for this prescription? Yes If no, delete pharmacy and type the correct one.   Has the prescription been filled recently? Yes  Is the patient out of the medication? Yes  Has the patient been seen for an appointment in the last year OR does the patient have an upcoming appointment? Yes  Can we respond through MyChart? No  Agent: Please be advised that Rx refills may take up to 3 business days. We ask that you follow-up with your pharmacy.

## 2024-06-09 NOTE — Telephone Encounter (Signed)
 Copied from CRM #[id number]. Topic: Clinical - Prescription Issue >> Jun 09, 2024  3:22 PM Chasity T wrote: Reason for CRM: Patient is calling to advised that pharmacy needs office to call them due to an issue with filling medication Semaglutide ,0.25 or 0.5MG /DOS, (OZEMPIC , 0.25 OR 0.5 MG/DOSE,) 2 MG/1.5ML SOPN. It is the pharmacy on file, she states that they will not tell her what the problem was and to just advise for the doctor to call them.

## 2024-06-09 NOTE — Telephone Encounter (Signed)
 Copied from CRM #[id number]. Topic: Clinical - Prescription Issue >> Jun 09, 2024 12:16 PM Nessti S wrote: Reason for CRM: tassy called because Semaglutide ,0.25 or 0.5MG /DOS, (OZEMPIC , 0.25 OR 0.5 MG/DOSE,) 2 MG/1.5ML SOPN should be 2MG /3ML. She would like the prescription renewed

## 2024-06-10 MED ORDER — OZEMPIC (0.25 OR 0.5 MG/DOSE) 2 MG/3ML ~~LOC~~ SOPN: 0.5000 mg | PEN_INJECTOR | SUBCUTANEOUS | 1 refills | Status: AC

## 2024-06-10 NOTE — Telephone Encounter (Signed)
 I called and spoke with patient and notified that script has been fixed and if any other problems please left us  know.

## 2024-06-10 NOTE — Addendum Note (Signed)
 Addended by: Shalandra Leu A on: 06/10/2024 10:21 AM   Modules accepted: Orders

## 2024-08-05 ENCOUNTER — Ambulatory Visit: Admitting: Cardiology

## 2024-08-08 ENCOUNTER — Ambulatory Visit

## 2024-08-12 ENCOUNTER — Ambulatory Visit: Admitting: Nurse Practitioner
